# Patient Record
Sex: Male | Born: 1955 | Race: White | Hispanic: No | Marital: Single | Smoking: Current every day smoker
Health system: Southern US, Community
[De-identification: ages and names within clinical notes are randomized; demographics above are authoritative.]

## PROBLEM LIST (undated history)

## (undated) DIAGNOSIS — C320 Malignant neoplasm of glottis: Secondary | ICD-10-CM

## (undated) DIAGNOSIS — Z923 Personal history of irradiation: Secondary | ICD-10-CM

## (undated) DIAGNOSIS — K5900 Constipation, unspecified: Secondary | ICD-10-CM

## (undated) DIAGNOSIS — R070 Pain in throat: Principal | ICD-10-CM

## (undated) DIAGNOSIS — I10 Essential (primary) hypertension: Secondary | ICD-10-CM

## (undated) DIAGNOSIS — E785 Hyperlipidemia, unspecified: Secondary | ICD-10-CM

## (undated) DIAGNOSIS — F209 Schizophrenia, unspecified: Secondary | ICD-10-CM

## (undated) DIAGNOSIS — Z720 Tobacco use: Secondary | ICD-10-CM

## (undated) DIAGNOSIS — E669 Obesity, unspecified: Secondary | ICD-10-CM

## (undated) DIAGNOSIS — J449 Chronic obstructive pulmonary disease, unspecified: Secondary | ICD-10-CM

## (undated) DIAGNOSIS — R1310 Dysphagia, unspecified: Secondary | ICD-10-CM

## (undated) DIAGNOSIS — B370 Candidal stomatitis: Principal | ICD-10-CM

## (undated) HISTORY — PX: INGUINAL HERNIA REPAIR: SHX194

## (undated) HISTORY — DX: Hyperlipidemia, unspecified: E78.5

## (undated) HISTORY — DX: Chronic obstructive pulmonary disease, unspecified: J44.9

## (undated) HISTORY — DX: Candidal stomatitis: B37.0

## (undated) HISTORY — DX: Constipation, unspecified: K59.00

## (undated) HISTORY — DX: Pain in throat: R07.0

## (undated) HISTORY — DX: Essential (primary) hypertension: I10

---

## 2000-03-14 ENCOUNTER — Emergency Department (HOSPITAL_COMMUNITY): Admission: EM | Admit: 2000-03-14 | Discharge: 2000-03-14 | Payer: Self-pay | Admitting: Emergency Medicine

## 2013-08-06 ENCOUNTER — Other Ambulatory Visit (HOSPITAL_COMMUNITY): Payer: Self-pay | Admitting: Otolaryngology

## 2013-08-07 ENCOUNTER — Encounter (HOSPITAL_COMMUNITY): Payer: Self-pay

## 2013-08-12 ENCOUNTER — Encounter (HOSPITAL_COMMUNITY): Payer: Self-pay | Admitting: *Deleted

## 2013-08-12 NOTE — Progress Notes (Signed)
Spoke with Garry Heater, nurse for Mr. Alford Highland at [facility]. She verified no allergies, medical history - not aware of pt's surgical hx. Pt will be able to complete the history day of surgery. Someone from their facility will be accompanying pt. Pt will be signing his own consent.

## 2013-08-12 NOTE — H&P (Addendum)
08/13/13 10:37 AM  Andrew Osborn  PREOPERATIVE HISTORY AND PHYSICAL  CHIEF COMPLAINT: false vocal fold and epiglottis mass in a smoker, left tonsil mass  HISTORY: This is a 58 year old who was seen ~ 1 year ago with laryngoscopy demonstrating worrisome laryngeal lesions and a left tonsillar fossa mass. Dr. Simeon Craft recommended and scheduled tonsillectomy and laryngosocpy with biopsy, but the patient canceled this surgery against Dr. Theressa Millard advice. He came back to clinic recently, ~ 1 year after his original visit now with dysphagia and chronic weight loss and sore throat. Repeat laryngoscopy now demonstrated a fungating and ulcerating right false vocal fold and epiglottic mass worrisome for squamous cell carcinoma, and a stable left tonsil mass. He now presents for tonsillectomy and direct laryngoscopy with biopsy with possible tracheotomy.  Dr. Simeon Craft, Alroy Dust has discussed the risks (airway problems, need for emergency tracheotomy, bleeding, anoxic brain injury, death, etc.), benefits, and alternatives of this procedure. The patient understands the risks and would like to proceed with the procedure. The chances of success of the procedure are >50% and the patient understands this. I personally performed an examination of the patient within 24 hours of the procedure.  PAST MEDICAL HISTORY: Past Medical History  Diagnosis Date  . Obesity   . Schizophrenia   . Tobacco abuse   . Swallowing difficulty      PAST SURGICAL HISTORY: History reviewed. No pertinent past surgical history.  MEDICATIONS: No current facility-administered medications on file prior to encounter.   No current outpatient prescriptions on file prior to encounter.    ALLERGIES: No Known Allergies  SOCIAL HISTORY: History   Social History  . Marital Status: Single    Spouse Name: N/A    Number of Children: N/A  . Years of Education: N/A   Occupational History  . Not on file.   Social History Main Topics  .  Smoking status: Current Every Day Smoker    Types: Cigarettes  . Smokeless tobacco: Not on file  . Alcohol Use: Not on file  . Drug Use: Not on file  . Sexual Activity: Not on file   Other Topics Concern  . Not on file   Social History Narrative  . No narrative on file    FAMILY HISTORY:History reviewed. No pertinent family history.  REVIEW OF SYSTEMS:  HEENT: + for dysphagia, sore throat, weight loss, otherwise negative x 12 systems except per HPI.  PHYSICAL EXAM:  GENERAL:  Hoarse voice, no stridor VITAL SIGNS:  There were no vitals filed for this visit.  SKIN:  Warm, dry HEENT:  Freidman 1+ right tonsil, left ~1 to 2cm smooth left tonsillar mass. NECK:  No palpable lymphadenopathy ABDOMEN:  soft MUSCULOSKELETAL: normal strength PSYCH:  H/o schizophrenia but grossly normal affect NEUROLOGIC:  CN 2-12 intact and symmetric  DIAGNOSTIC STUDIES: office laryngosocpy showed an irregular right false vocal fold and R>L epiglottic mucosal mass with ulceration on the laryngeal surface of the epiglottis, mobile true vocal folds.  ASSESSMENT AND PLAN: Plan to proceed with direct laryngoscopy with biopsy, possible tracheotomy if needed, and tonsillectomy. Patient understands the risks, benefits, and alternatives.  08/13/13  10:38 AM Andrew Osborn

## 2013-08-13 ENCOUNTER — Ambulatory Visit (HOSPITAL_COMMUNITY)
Admission: RE | Admit: 2013-08-13 | Discharge: 2013-08-13 | Disposition: A | Discharge status: Home / Self Care | Payer: Medicare Other | Source: Ambulatory Visit | Attending: Otolaryngology | Admitting: Otolaryngology

## 2013-08-13 ENCOUNTER — Encounter (HOSPITAL_COMMUNITY): Payer: Medicare Other | Admitting: Certified Registered Nurse Anesthetist

## 2013-08-13 ENCOUNTER — Encounter (HOSPITAL_COMMUNITY): Payer: Self-pay | Admitting: Anesthesiology

## 2013-08-13 ENCOUNTER — Encounter (HOSPITAL_COMMUNITY)
Admission: RE | Disposition: A | Discharge status: Home / Self Care | Payer: Self-pay | Source: Ambulatory Visit | Attending: Otolaryngology

## 2013-08-13 ENCOUNTER — Ambulatory Visit (HOSPITAL_COMMUNITY): Payer: Medicare Other | Admitting: Certified Registered Nurse Anesthetist

## 2013-08-13 ENCOUNTER — Ambulatory Visit (HOSPITAL_COMMUNITY): Payer: Medicare Other

## 2013-08-13 DIAGNOSIS — C320 Malignant neoplasm of glottis: Secondary | ICD-10-CM

## 2013-08-13 DIAGNOSIS — F17200 Nicotine dependence, unspecified, uncomplicated: Secondary | ICD-10-CM | POA: Insufficient documentation

## 2013-08-13 DIAGNOSIS — F209 Schizophrenia, unspecified: Secondary | ICD-10-CM | POA: Diagnosis not present

## 2013-08-13 DIAGNOSIS — C321 Malignant neoplasm of supraglottis: Secondary | ICD-10-CM | POA: Diagnosis not present

## 2013-08-13 DIAGNOSIS — J383 Other diseases of vocal cords: Secondary | ICD-10-CM | POA: Diagnosis present

## 2013-08-13 DIAGNOSIS — J359 Chronic disease of tonsils and adenoids, unspecified: Secondary | ICD-10-CM | POA: Insufficient documentation

## 2013-08-13 HISTORY — DX: Obesity, unspecified: E66.9

## 2013-08-13 HISTORY — DX: Tobacco use: Z72.0

## 2013-08-13 HISTORY — DX: Malignant neoplasm of glottis: C32.0

## 2013-08-13 HISTORY — DX: Schizophrenia, unspecified: F20.9

## 2013-08-13 HISTORY — PX: DIRECT LARYNGOSCOPY: SHX5326

## 2013-08-13 HISTORY — DX: Dysphagia, unspecified: R13.10

## 2013-08-13 LAB — BASIC METABOLIC PANEL
Anion gap: 13 (ref 5–15)
BUN: 7 mg/dL (ref 6–23)
CALCIUM: 9.5 mg/dL (ref 8.4–10.5)
CO2: 27 meq/L (ref 19–32)
CREATININE: 0.63 mg/dL (ref 0.50–1.35)
Chloride: 94 mEq/L — ABNORMAL LOW (ref 96–112)
GFR calc Af Amer: >90 mL/min (ref >90)
GLUCOSE: 106 mg/dL — AB (ref 70–99)
Potassium: 4.2 mEq/L (ref 3.7–5.3)
SODIUM: 134 meq/L — AB (ref 137–147)

## 2013-08-13 LAB — CBC
HCT: 37.5 % — ABNORMAL LOW (ref 39.0–52.0)
Hemoglobin: 12.9 g/dL — ABNORMAL LOW (ref 13.0–17.0)
MCH: 30.4 pg (ref 26.0–34.0)
MCHC: 34.4 g/dL (ref 30.0–36.0)
MCV: 88.4 fL (ref 78.0–100.0)
PLATELETS: 224 10*3/uL (ref 150–400)
RBC: 4.24 MIL/uL (ref 4.22–5.81)
RDW: 13.6 % (ref 11.5–15.5)
WBC: 10 10*3/uL (ref 4.0–10.5)

## 2013-08-13 SURGERY — LARYNGOSCOPY, DIRECT
Anesthesia: General | Site: Throat

## 2013-08-13 MED ORDER — ONDANSETRON HCL 4 MG/2ML IJ SOLN
INTRAMUSCULAR | Status: DC | PRN
  Administered 2013-08-13: 4 mg via INTRAVENOUS

## 2013-08-13 MED ORDER — LIDOCAINE HCL (CARDIAC) 20 MG/ML IV SOLN
INTRAVENOUS | Status: DC | PRN
  Administered 2013-08-13: 100 mg via INTRAVENOUS
  Administered 2013-08-13: 60 mg via INTRATRACHEAL

## 2013-08-13 MED ORDER — EPINEPHRINE HCL (NASAL) 0.1 % NA SOLN
NASAL | Status: AC
  Filled 2013-08-13: qty 30

## 2013-08-13 MED ORDER — FENTANYL CITRATE 0.05 MG/ML IJ SOLN
INTRAMUSCULAR | Status: AC
  Filled 2013-08-13: qty 5

## 2013-08-13 MED ORDER — LIDOCAINE HCL (CARDIAC) 20 MG/ML IV SOLN
INTRAVENOUS | Status: AC
Start: 2013-08-13 — End: 2013-08-13
  Filled 2013-08-13: qty 10

## 2013-08-13 MED ORDER — DEXAMETHASONE SODIUM PHOSPHATE 10 MG/ML IJ SOLN
10.0000 mg | Freq: Once | INTRAMUSCULAR | Status: AC
  Administered 2013-08-13: 10 mg via INTRAVENOUS

## 2013-08-13 MED ORDER — ONDANSETRON HCL 4 MG/2ML IJ SOLN: 4.0000 mg | Freq: Once | INTRAMUSCULAR | Status: DC | PRN

## 2013-08-13 MED ORDER — CLINDAMYCIN PHOSPHATE 600 MG/50ML IV SOLN
600.0000 mg | Freq: Once | INTRAVENOUS | Status: AC
  Administered 2013-08-13: 600 mg via INTRAVENOUS
  Filled 2013-08-13: qty 50

## 2013-08-13 MED ORDER — LIDOCAINE HCL (CARDIAC) 20 MG/ML IV SOLN
INTRAVENOUS | Status: AC
  Filled 2013-08-13: qty 5

## 2013-08-13 MED ORDER — FENTANYL CITRATE 0.05 MG/ML IJ SOLN
INTRAMUSCULAR | Status: DC | PRN
  Administered 2013-08-13: 100 ug via INTRAVENOUS

## 2013-08-13 MED ORDER — OXYMETAZOLINE HCL 0.05 % NA SOLN
NASAL | Status: AC
  Filled 2013-08-13: qty 15

## 2013-08-13 MED ORDER — LACTATED RINGERS IV SOLN
INTRAVENOUS | Status: DC
  Administered 2013-08-13 (×2): via INTRAVENOUS

## 2013-08-13 MED ORDER — MIDAZOLAM HCL 2 MG/2ML IJ SOLN
INTRAMUSCULAR | Status: AC
  Filled 2013-08-13: qty 2

## 2013-08-13 MED ORDER — HYDROMORPHONE HCL PF 1 MG/ML IJ SOLN: 0.2500 mg | INTRAMUSCULAR | Status: DC | PRN

## 2013-08-13 MED ORDER — ARTIFICIAL TEARS OP OINT
TOPICAL_OINTMENT | OPHTHALMIC | Status: DC | PRN
  Administered 2013-08-13: 1 via OPHTHALMIC

## 2013-08-13 MED ORDER — ROCURONIUM BROMIDE 50 MG/5ML IV SOLN
INTRAVENOUS | Status: AC
  Filled 2013-08-13: qty 1

## 2013-08-13 MED ORDER — SUCCINYLCHOLINE CHLORIDE 20 MG/ML IJ SOLN
INTRAMUSCULAR | Status: DC | PRN
  Administered 2013-08-13: 100 mg via INTRAVENOUS

## 2013-08-13 MED ORDER — PROPOFOL 10 MG/ML IV BOLUS
INTRAVENOUS | Status: AC
  Filled 2013-08-13: qty 20

## 2013-08-13 MED ORDER — PROPOFOL 10 MG/ML IV BOLUS
INTRAVENOUS | Status: DC | PRN
  Administered 2013-08-13: 260 mg via INTRAVENOUS

## 2013-08-13 MED ORDER — ROCURONIUM BROMIDE 100 MG/10ML IV SOLN
INTRAVENOUS | Status: DC | PRN
  Administered 2013-08-13: 30 mg via INTRAVENOUS

## 2013-08-13 MED ORDER — EPINEPHRINE HCL 1 MG/ML IJ SOLN
INTRAMUSCULAR | Status: AC
  Filled 2013-08-13: qty 1

## 2013-08-13 MED ORDER — NEOSTIGMINE METHYLSULFATE 10 MG/10ML IV SOLN
INTRAVENOUS | Status: DC | PRN
  Administered 2013-08-13: 3 mg via INTRAVENOUS

## 2013-08-13 MED ORDER — GLYCOPYRROLATE 0.2 MG/ML IJ SOLN
INTRAMUSCULAR | Status: AC
  Filled 2013-08-13: qty 2

## 2013-08-13 MED ORDER — GLYCOPYRROLATE 0.2 MG/ML IJ SOLN
INTRAMUSCULAR | Status: DC | PRN
  Administered 2013-08-13: 0.4 mg via INTRAVENOUS

## 2013-08-13 MED ORDER — ARTIFICIAL TEARS OP OINT
TOPICAL_OINTMENT | OPHTHALMIC | Status: AC
  Filled 2013-08-13: qty 3.5

## 2013-08-13 MED ORDER — OXYMETAZOLINE HCL 0.05 % NA SOLN
NASAL | Status: DC | PRN
  Administered 2013-08-13: 1

## 2013-08-13 MED ORDER — EPHEDRINE SULFATE 50 MG/ML IJ SOLN
INTRAMUSCULAR | Status: DC | PRN
  Administered 2013-08-13: 10 mg via INTRAVENOUS

## 2013-08-13 MED ORDER — EPINEPHRINE HCL 1 MG/ML IJ SOLN
INTRAMUSCULAR | Status: DC | PRN
  Administered 2013-08-13: 1 mg

## 2013-08-13 MED ORDER — 0.9 % SODIUM CHLORIDE (POUR BTL) OPTIME
TOPICAL | Status: DC | PRN
  Administered 2013-08-13: 1000 mL

## 2013-08-13 MED ORDER — ONDANSETRON HCL 4 MG/2ML IJ SOLN
INTRAMUSCULAR | Status: AC
  Filled 2013-08-13: qty 2

## 2013-08-13 MED ORDER — ESMOLOL HCL 10 MG/ML IV SOLN
INTRAVENOUS | Status: DC | PRN
  Administered 2013-08-13: 20 mg via INTRAVENOUS

## 2013-08-13 MED ORDER — PHENYLEPHRINE HCL 10 MG/ML IJ SOLN
INTRAMUSCULAR | Status: DC | PRN
  Administered 2013-08-13: 80 ug via INTRAVENOUS
  Administered 2013-08-13: 40 ug via INTRAVENOUS
  Administered 2013-08-13: 80 ug via INTRAVENOUS
  Administered 2013-08-13: 40 ug via INTRAVENOUS

## 2013-08-13 SURGICAL SUPPLY — 34 items
CANISTER SUCTION 2500CC (MISCELLANEOUS) ×4 IMPLANT
CATH ROBINSON RED A/P 10FR (CATHETERS) ×3 IMPLANT
CLEANER TIP ELECTROSURG 2X2 (MISCELLANEOUS) ×4 IMPLANT
CONT SPEC 4OZ CLIKSEAL STRL BL (MISCELLANEOUS) ×9 IMPLANT
COVER TABLE BACK 60X90 (DRAPES) ×4 IMPLANT
DRAPE PROXIMA HALF (DRAPES) ×4 IMPLANT
ELECT COATED BLADE 2.86 ST (ELECTRODE) ×4 IMPLANT
GAUZE SPONGE 4X4 16PLY XRAY LF (GAUZE/BANDAGES/DRESSINGS) ×4 IMPLANT
GLOVE BIO SURGEON STRL SZ 6.5 (GLOVE) ×2 IMPLANT
GLOVE BIO SURGEONS STRL SZ 6.5 (GLOVE) ×1
GLOVE BIOGEL PI IND STRL 6.5 (GLOVE) ×1 IMPLANT
GLOVE BIOGEL PI IND STRL 7.0 (GLOVE) ×1 IMPLANT
GLOVE BIOGEL PI INDICATOR 6.5 (GLOVE) ×2
GLOVE BIOGEL PI INDICATOR 7.0 (GLOVE) ×2
GLOVE SURG SS PI 7.0 STRL IVOR (GLOVE) ×3 IMPLANT
GLOVE SURG SS PI 7.5 STRL IVOR (GLOVE) ×4 IMPLANT
GOWN STRL REUS W/ TWL LRG LVL3 (GOWN DISPOSABLE) ×4 IMPLANT
GOWN STRL REUS W/TWL LRG LVL3 (GOWN DISPOSABLE) ×12
GUARD TEETH (MISCELLANEOUS) ×4 IMPLANT
KIT BASIN OR (CUSTOM PROCEDURE TRAY) ×4 IMPLANT
KIT ROOM TURNOVER OR (KITS) ×4 IMPLANT
MARKER SKIN DUAL TIP RULER LAB (MISCELLANEOUS) ×4 IMPLANT
NS IRRIG 1000ML POUR BTL (IV SOLUTION) ×4 IMPLANT
PACK SURGICAL SETUP 50X90 (CUSTOM PROCEDURE TRAY) ×4 IMPLANT
PAD ARMBOARD 7.5X6 YLW CONV (MISCELLANEOUS) ×8 IMPLANT
PATTIES SURGICAL .5 X1 (DISPOSABLE) ×4 IMPLANT
PENCIL BUTTON HOLSTER BLD 10FT (ELECTRODE) ×4 IMPLANT
SOLUTION ANTI FOG 6CC (MISCELLANEOUS) ×4 IMPLANT
SPONGE TONSIL 1 RF SGL (DISPOSABLE) ×3 IMPLANT
SYR BULB 3OZ (MISCELLANEOUS) ×4 IMPLANT
TOWEL OR 17X24 6PK STRL BLUE (TOWEL DISPOSABLE) ×8 IMPLANT
TUBE CONNECTING 12'X1/4 (SUCTIONS) ×2
TUBE CONNECTING 12X1/4 (SUCTIONS) ×5 IMPLANT
YANKAUER SUCT BULB TIP NO VENT (SUCTIONS) ×4 IMPLANT

## 2013-08-13 NOTE — Discharge Instructions (Signed)
Follow up with Dr. Simeon Craft in one week. Rx given to caretaker for amoxicillin, zofran, hydrocodone, and medrol dose pack. Soft or regular diet as tolerated.

## 2013-08-13 NOTE — Anesthesia Procedure Notes (Signed)
Procedure Name: Intubation Date/Time: 08/13/2013 11:13 AM Performed by: Maryland Pink Pre-anesthesia Checklist: Patient identified, Emergency Drugs available, Suction available, Patient being monitored and Timeout performed Patient Re-evaluated:Patient Re-evaluated prior to inductionOxygen Delivery Method: Circle system utilized Preoxygenation: Pre-oxygenation with 100% oxygen Intubation Type: IV induction Ventilation: Mask ventilation without difficulty and Oral airway inserted - appropriate to patient size Laryngoscope Size: Mac and 3 Tube type: Oral Tube size: 6.5 mm Number of attempts: 1 Airway Equipment and Method: Stylet and LTA kit utilized Placement Confirmation: ETT inserted through vocal cords under direct vision,  positive ETCO2 and breath sounds checked- equal and bilateral Secured at: 23 cm Tube secured with: Tape Dental Injury: Teeth and Oropharynx as per pre-operative assessment

## 2013-08-13 NOTE — Anesthesia Postprocedure Evaluation (Signed)
  Anesthesia Post-op Note  Patient: Andrew Osborn  Procedure(s) Performed: Procedure(s) with comments: DIRECT LARYNGOSCOPY (N/A) - Direct Laryngoscopy with biopsy.  Patient Location: PACU  Anesthesia Type:General  Level of Consciousness: awake, alert , oriented and patient cooperative  Airway and Oxygen Therapy: Patient Spontanous Breathing  Post-op Pain: mild  Post-op Assessment: Post-op Vital signs reviewed, Patient's Cardiovascular Status Stable, Respiratory Function Stable, Patent Airway, No signs of Nausea or vomiting and Pain level controlled  Post-op Vital Signs: stable  Last Vitals:  Filed Vitals:   08/13/13 1319  BP: 102/70  Pulse: 72  Temp:   Resp:     Complications: No apparent anesthesia complications

## 2013-08-13 NOTE — Anesthesia Preprocedure Evaluation (Signed)
Anesthesia Evaluation  Patient identified by MRN, date of birth, ID band Patient awake    Reviewed: Allergy & Precautions, H&P , NPO status , Patient's Chart, lab work & pertinent test results  Airway       Dental   Pulmonary Current Smoker,          Cardiovascular     Neuro/Psych Schizophrenia    GI/Hepatic   Endo/Other  Morbid obesity  Renal/GU      Musculoskeletal   Abdominal   Peds  Hematology   Anesthesia Other Findings Throat mass  Reproductive/Obstetrics                           Anesthesia Physical Anesthesia Plan  ASA: II  Anesthesia Plan: General   Post-op Pain Management:    Induction: Intravenous  Airway Management Planned: Oral ETT  Additional Equipment:   Intra-op Plan:   Post-operative Plan: Extubation in OR  Informed Consent: I have reviewed the patients History and Physical, chart, labs and discussed the procedure including the risks, benefits and alternatives for the proposed anesthesia with the patient or authorized representative who has indicated his/her understanding and acceptance.     Plan Discussed with: CRNA, Anesthesiologist and Surgeon  Anesthesia Plan Comments:         Anesthesia Quick Evaluation

## 2013-08-13 NOTE — Op Note (Signed)
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  ESTIMATED BLOOD LOSS: less than 15 mL.  DRAINS: none SPECIMENS: right false vocal fold mass for permanent and frozen (invasive squamous cell carcinoma on frozen) INDICATIONS: The patient is a 58yo with a history of progressive right false vocal fold/epiglottis lesions/masses. He was encouraged to have direct laryngoscopy with biopsy one year ago for this but refused/canceled. He is now taken to the OR for direct laryngoscopy with biopsy.  DESCRIPTION OF OPERATION: The patient was brought to the operating room and was placed in the supine position and was placed under general endotracheal anesthesia by anesthesiology.   Direct laryngoscopy was performed using the Dedo laryngoscope and the suspension set and 0 degree endoscope. He was a grade 1 view. The right and left tonsil were actually symmetric and Freidman 1 to 2+ and symmetric on laryngoscopy so tonsillectomy/tonsil biopsy was not performed. The tongue base, piriform sinuses,  and posterior pharynx appeared grossly normal. The right false vocal fold and the right and midline laryngeal surface of the epiglottis showed a fungating, irregular mass. This was biopsied using the straight biopsy forceps and the 0 degree endoscope. Frozen section biopsy came back positive for invasive squamous cell carcinoma. The true vocal folds were grossly free of tumor and the subglottic larynx was free of tumor but the right laryngeal ventricle was involved and the false vocal fold tumor was very close to the superior surface of the right true vocal fold. Once the biopsies came back positive for squamous cell carcinoma I  obtained hemostasis with topical 1:1000 epinephrine pledgets which were removed after 10 minutes. Hemostasis was noted so the endoscope, Dedo, suspension, and tooth guard were all removed.   The patient was turned back to anesthesia and awakened from anesthesia and extubated without difficulty. The patient tolerated the procedure well with no immediate complications and was taken to the postoperative recovery area in good condition.   Dr. Ruby Cola was present and performed the entire procedure. 08/13/2013  12:17 PM Ruby Cola

## 2013-08-13 NOTE — Discharge Summary (Signed)
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 Simeon Craft in one week. Rx given to caretaker for amoxicillin, zofran, hydrocodone, and medrol dose pack. Soft or regular diet as tolerated.  Hospital Course: taken to OR for DL with biopsy, did well post-op, discharged from PACU in good condition.  Ruby Cola 12:28 PM 08/13/2013

## 2013-08-13 NOTE — Transfer of Care (Signed)
Immediate Anesthesia Transfer of Care Note  Patient: Andrew Osborn  Procedure(s) Performed: Procedure(s) with comments: DIRECT LARYNGOSCOPY (N/A) - Direct Laryngoscopy with biopsy.  Patient Location: PACU  Anesthesia Type:General  Level of Consciousness: awake, alert  and oriented  Airway & Oxygen Therapy: Patient Spontanous Breathing and Patient connected to nasal cannula oxygen  Post-op Assessment: Report given to PACU RN and Post -op Vital signs reviewed and stable  Post vital signs: Reviewed and stable  Complications: No apparent anesthesia complications

## 2013-08-14 ENCOUNTER — Encounter (HOSPITAL_COMMUNITY): Payer: Self-pay | Admitting: Otolaryngology

## 2013-08-17 ENCOUNTER — Telehealth: Payer: Self-pay | Admitting: Hematology and Oncology

## 2013-08-17 NOTE — Telephone Encounter (Signed)
S/W MS. Joya Gaskins AND GAVE NP APPT FOR 07/28 @ 10:45 W/DR. [place]

## 2013-08-19 ENCOUNTER — Encounter: Payer: Self-pay | Admitting: Radiation Oncology

## 2013-08-19 NOTE — Progress Notes (Addendum)
Head and Neck Cancer Location of Tumor / Histology: Invasive Squamous Cell Carcinoma Right of the Vocal Cord   "This is a 58 year old who was seen ~ 1 year ago with laryngoscopy demonstrating worrisome laryngeal lesions and a left tonsillar fossa mass. Dr. Simeon Craft recommended and scheduled tonsillectomy and laryngosocpy with biopsy, but the patient canceled this surgery against Dr. Theressa Millard advice. He came back to clinic recently, ~ 1 year after his original visit now with dysphagia and chronic weight loss and sore throat. Repeat laryngoscopy now demonstrated a fungating and ulcerating right false vocal fold and epiglottic mass worrisome for squamous cell carcinoma, and a stable left tonsil mass. He now presents for tonsillectomy and direct laryngoscopy with biopsy with possible tracheotomy."  Biopsies of Right Vocal Cord (if applicable) revealed:  05/31/75 Diagnosis 1. Larynx, biopsy, Right false vocal cord mass - INVASIVE SQUAMOUS CELL CARCINOMA, SEE COMMENT. 2. Larynx, biopsy, Right vocal cord - INVASIVE SQUAMOUS CELL CARCINOMA, SEE COMMENT The invasive tumor demonstrates diffuse moderate to strong p16 immunostain expression (parts 1and 2).  Nutrition Status:  Weight changes:   Loss of ? Lbs in past 3 weeks, weight 215 lbs 5 years ago  Swallowing status: Dysphagia and Normal, if any, for PEG tube:   Tobacco/Marijuana/Snuff/ETOH use: smokes cigarettes, currently 1/2 PPD x 20 years, no smokeless tobacco, alcohol in past  Past/Anticipated interventions by otolaryngology, if any: Dr. Ruby Cola- Biopsy of the Larynx - Right Vocal Cords.  Past/Anticipated interventions by medical oncology, if any: Appointment with Dr. Heath Lark on 08/25/13  Referrals yet, to any of the following?  Social Work? no  Dentistry? no  Swallowing therapy? no  Nutrition? no  Med/Onc? 08/25/13  PEG placement? no  Financial Counseling: 08/25/13  SAFETY ISSUES:  Prior radiation?  NO  Pacemaker/ICD? no  Possible current pregnancy? NA  Is the patient on methotrexate? No  Current Complaints / other details:  Has lived at [facility] x [facility] years. 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 Never married, no children. He states he weighed 215 lbs 5 years ago. Loss of appetite, difficulty eating, swallowing x 3 weeks. Pt reports right ear pain w/swallowing that radiates to his jaw.

## 2013-08-21 ENCOUNTER — Encounter: Payer: Self-pay | Admitting: Radiation Oncology

## 2013-08-21 ENCOUNTER — Other Ambulatory Visit: Payer: Self-pay | Admitting: Radiation Oncology

## 2013-08-21 ENCOUNTER — Encounter: Payer: Self-pay | Admitting: *Deleted

## 2013-08-21 ENCOUNTER — Telehealth: Payer: Self-pay | Admitting: *Deleted

## 2013-08-21 ENCOUNTER — Ambulatory Visit
Admission: RE | Admit: 2013-08-21 | Discharge: 2013-08-21 | Disposition: A | Discharge status: Home / Self Care | Payer: Medicare Other | Source: Ambulatory Visit | Attending: Radiation Oncology | Admitting: Radiation Oncology

## 2013-08-21 VITALS — BP 132/82 | HR 71 | Temp 97.8°F | Resp 20 | Ht 70.5 in | Wt 155.2 lb

## 2013-08-21 DIAGNOSIS — Z931 Gastrostomy status: Secondary | ICD-10-CM | POA: Diagnosis not present

## 2013-08-21 DIAGNOSIS — L589 Radiodermatitis, unspecified: Secondary | ICD-10-CM | POA: Insufficient documentation

## 2013-08-21 DIAGNOSIS — C321 Malignant neoplasm of supraglottis: Secondary | ICD-10-CM | POA: Insufficient documentation

## 2013-08-21 DIAGNOSIS — F17200 Nicotine dependence, unspecified, uncomplicated: Secondary | ICD-10-CM | POA: Insufficient documentation

## 2013-08-21 DIAGNOSIS — R1310 Dysphagia, unspecified: Secondary | ICD-10-CM | POA: Insufficient documentation

## 2013-08-21 DIAGNOSIS — F209 Schizophrenia, unspecified: Secondary | ICD-10-CM | POA: Insufficient documentation

## 2013-08-21 DIAGNOSIS — Z510 Encounter for antineoplastic radiation therapy: Secondary | ICD-10-CM | POA: Diagnosis not present

## 2013-08-21 DIAGNOSIS — Z7982 Long term (current) use of aspirin: Secondary | ICD-10-CM | POA: Insufficient documentation

## 2013-08-21 HISTORY — DX: Malignant neoplasm of glottis: C32.0

## 2013-08-21 MED ORDER — LARYNGOSCOPY SOLUTION RAD-ONC
15.0000 mL | Freq: Once | TOPICAL | Status: AC
  Administered 2013-08-21: 15 mL via TOPICAL
  Filled 2013-08-21: qty 15

## 2013-08-21 NOTE — Progress Notes (Signed)
Please see the Nurse Progress Note in the MD Initial Consult Encounter for this patient. 

## 2013-08-21 NOTE — Telephone Encounter (Signed)
[place], spoke with [place].  Informed her of patient's 08/26/13 12:45 arrival appt for PET and CT scans; noted pt is not to have anything to eat/drink 6 hrs prior to appt except for small sips of water with necessary medications.  I asked that she relay information to [place], [place] staffperson who will be bringing pt to his appts.  She verbalized understanding.    Gayleen Orem, RN, BSN, [facility] Navigator [phone]

## 2013-08-21 NOTE — Progress Notes (Signed)
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 male with schizophrenia, accompanied but a caregiver from his group home today, who was seen ~ 1 year ago with laryngoscopy demonstrating worrisome laryngeal lesions and a left tonsillar fossa mass. Dr. Simeon Craft recommended and scheduled tonsillectomy and laryngosocpy with biopsy, but the patient canceled this surgery against Dr. Theressa Millard advice. He came back to clinic recently, ~ 1 year after his original visit now with dysphagia, odynophagia,  and chronic weight loss. Repeat laryngoscopy now demonstrated a fungating and ulcerating right false vocal fold and epiglottic mass worrisome for squamous cell carcinoma, and a stable left tonsil mass. Per op note, left tonsil mass did not appear suspicious enough for biopsy, but right false and true cords were biopsied.  60lb wt loss/ 5 yrs  He is mainly drinking fluids, little food at all due to swallowing problems.He has right otalgia. No stridor or respiratory issues.  Tobacco/Marijuana/Snuff/ETOH use: smokes cigarettes, currently 1/2 PPD x 20 years, no smokeless tobacco, alcohol in past    Biopsies of Right Vocal Cord (if applicable) revealed:  2/58/52  Diagnosis  1. Larynx, biopsy, Right false vocal cord mass  - INVASIVE SQUAMOUS CELL CARCINOMA, SEE COMMENT.  2. Larynx, biopsy, Right vocal cord  - INVASIVE SQUAMOUS CELL CARCINOMA, SEE COMMENT  The invasive tumor demonstrates diffuse moderate to strong p16 immunostain expression (parts 1and 2).  Past/Anticipated interventions by medical oncology, if any: Appointment  with Dr. 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    PAST SURGICAL HISTORY: Past Surgical History  Procedure Laterality Date  . Direct laryngoscopy N/A 08/13/2013    Procedure: DIRECT LARYNGOSCOPY;  Surgeon: Ruby Cola, MD;  Location: [facility];  Service: ENT;  Laterality: N/A;  Direct Laryngoscopy with biopsy.  . Inguinal hernia repair      as 58 year old    FAMILY HISTORY: family history includes Cancer in his father and mother.  SOCIAL HISTORY:  reports that he has been smoking Cigarettes.  He has a 10 pack-year smoking history. He does not have any smokeless tobacco history on file. He reports that he does not drink alcohol or use illicit drugs.  ALLERGIES: Review of patient's allergies indicates no known allergies.  MEDICATIONS:  Current Outpatient Prescriptions  Medication Sig Dispense Refill  . acetaminophen (TYLENOL) 325 MG tablet Take 650 mg by mouth every 6 (six) hours as needed (pain).      Marland Kitchen amoxicillin (AMOXIL) 400 MG/5ML suspension Take 400 mg by mouth 2 (two) times daily. Amoxicillin 400 mg/5ml.  Take 6 ml po BID      . aspirin EC 81 MG tablet Take 81 mg by mouth daily.      . budesonide-formoterol (SYMBICORT) 80-4.5 MCG/ACT inhaler Inhale 2 puffs into the lungs 2 (two) times daily.      . cholecalciferol (VITAMIN D) 400 UNITS TABS tablet Take 800 Units by mouth daily.      . cycloSPORINE (RESTASIS) 0.05 % ophthalmic emulsion Place 1 drop  into both eyes 2 (two) times daily.      . hydrocerin (EUCERIN) CREA Apply 1 application topically 2 (two) times daily. Spread to bilateral palm twice daily      . HYDROCODONE-ACETAMINOPHEN PO Take 7.5 mg/mL by mouth as needed (Hydorcodone 7.5/325mg /86ml oral solution.  Take 15 ml every 4-6 hrs prn pain).      Marland Kitchen ibuprofen (ADVIL,MOTRIN) 400 MG tablet Take  400 mg by mouth every 6 (six) hours as needed for headache or mild pain.      Marland Kitchen lisinopril (PRINIVIL,ZESTRIL) 10 MG tablet Take 10 mg by mouth daily.      . ondansetron (ZOFRAN) 4 MG tablet Take 4 mg by mouth every 8 (eight) hours as needed for nausea or vomiting.      . risperiDONE (RISPERDAL) 2 MG tablet Take 2 mg by mouth 2 (two) times daily.      . simvastatin (ZOCOR) 10 MG tablet Take 10 mg by mouth at bedtime.       No current facility-administered medications for this encounter.    REVIEW OF SYSTEMS:  Notable for that above.   PHYSICAL EXAM:  height is 5' 10.5" (1.791 m) and weight is 155 lb 3.2 oz (70.398 kg). His oral temperature is 97.8 F (36.6 C). His blood pressure is 132/82 and his pulse is 71. His respiration is 20.   General: Alert and oriented, in no acute distress HEENT: Head is normocephalic. Pupils are equally round and reactive to light. Extraocular movements are intact. Oropharynx is notable for asymmetric swelling of left tonsil. Poor dentition. Moist mucosa. Neck: soft swelling in right level II - query enlarged node. Otherwise no cervical or supraclavicular lymphadenopathy. Heart: Regular in rate and rhythm with no murmurs, rubs, or gallops. Chest: Clear to auscultation bilaterally, with no rhonchi, wheezes, or rales. Abdomen: Soft, nontender, nondistended, with no rigidity or guarding. Extremities: No cyanosis or edema. Lymphatics: No concerning lymphadenopathy. Skin: No concerning lesions. Musculoskeletal: symmetric strength and muscle tone throughout. Neurologic: Cranial nerves II through XII are grossly intact. No obvious focalities. Speech is fluent. Coordination is intact. Psychiatric: Judgment and insight are intact. Affect is appropriate.  PROCEDURE NOTE: After anesthetizing the nasal cavity with topical lidocaine and phenylephrine, the flexible endoscope was introduced and passed through the nasal cavity.  Notable for right false cord swelling and  irregularity along the laryngeal surface of the right epiglottis.  ECOG = 2  0 - Asymptomatic (Fully active, able to carry on all predisease activities without restriction)  1 - Symptomatic but completely ambulatory (Restricted in physically strenuous activity but ambulatory and able to carry out work of a light or sedentary nature. For example, light housework, office work)  2 - Symptomatic, <50% in bed during the day (Ambulatory and capable of all self care but unable to carry out any work activities. Up and about more than 50% of waking hours)  3 - Symptomatic, >50% in bed, but not bedbound (Capable of only limited self-care, confined to bed or chair 50% or more of waking hours)  4 - Bedbound (Completely disabled. Cannot carry on any self-care. Totally confined to bed or chair)  5 - Death   Eustace Pen MM, Creech RH, Tormey DC, et al. [phone]). "Toxicity and response criteria of the [facility]". [employer] Oncol. 5 (6): 649-55   LABORATORY DATA:  Lab Results  Component Value Date   WBC 10.0 08/13/2013   HGB 12.9* 08/13/2013   HCT 37.5* 08/13/2013   MCV 88.4  08/13/2013   PLT 224 08/13/2013   CMP     Component Value Date/Time   NA 134* 08/13/2013 0950   K 4.2 08/13/2013 0950   CL 94* 08/13/2013 0950   CO2 27 08/13/2013 0950   GLUCOSE 106* 08/13/2013 0950   BUN 7 08/13/2013 0950   CREATININE 0.63 08/13/2013 0950   CALCIUM 9.5 08/13/2013 0950   GFRNONAA >90 08/13/2013 0950   GFRAA >90 08/13/2013 0950         RADIOGRAPHY: Dg Chest 2 View  08/13/2013   CLINICAL DATA:  Swallowing difficulty; history of schizophrenia  EXAM: CHEST  2 VIEW  COMPARISON:  None.  FINDINGS: The lungs are mildly hyperinflated and clear. The heart and mediastinal structures are normal. There is no pleural effusion or pneumothorax. The bony thorax is unremarkable.  IMPRESSION: COPD. There is no active cardiopulmonary disease. No mediastinal mass is demonstrated.   Electronically Signed   By: David   [place]   On: 08/13/2013 10:27      IMPRESSION/PLAN: This is a delightful 58 year old man with right supraglottic squamous cell carcinoma, positive smoking history. He is an excellent candidate for radiotherapy. Plan is as below:   1) Next week will med/onc to discuss chemotherapy  1a) PET and neck CT ordered today for staging. He understands this is needed to finalize treatment recommendations  2) Referral to dentistry for dental evaluation/extractions if needed   3) Will refer to social work for social support   4) Will refer to nutrition for nutrition support - gave patient and caregiver suggestions on ways to improve nutrition until then  5) Medical Oncology may eventually refer to surgery for PEG tube placement. This is depending on chemotherapy plans, if any.   6) Will refer to swallowing therapy for dysphagia    7) Simulation once cleared by dentistry. Anticipate 7 weeks of RT - 70 Gy in 35 fractions.   8) PT referral will be put on hold - do not want to overwhelm him with appointments right now.  9) Gayleen Orem, RN, our Head and Neck Oncology Navigator will add to ENT tumor board for discussion  10) The patient continues to use tobacco. The patient was counseled to stop using tobacco and was offered pharmacotherapy and further counseling to help with this. The patient at this time will try E cigarettes and would rather consider total cessation later. Informational sheets on quitting hotline/ support groups provided today.  It was a pleasure meeting the patient today. We discussed the risks, benefits, and side effects of adjuvant radiotherapy. We talked in detail about acute and late effects. He understands that some of the most bothersome acute effects will be significant soreness of the mouth and throat, changes in taste, changes in salivary function, skin irritation, hair loss, dehydration, weight loss and fatigue. We talked about late effects which include but are not necessarily  limited to dysphagia, hypothyroidism, laryngeal injury, nerve injury, spinal cord injury, dry mouth, and neck edema. No guarantees of treatment were given. A consent form was signed and placed in the patient's medical record. The patient is enthusiastic about proceeding with treatment. I look forward to participating in the patient's care.   __________________________________________   Eppie Gibson, MD

## 2013-08-21 NOTE — Progress Notes (Signed)
Met with patient and his [place] caregiver, Ivin Booty, during initial consult with Dr. Isidore Moos.  Introduced myself as his Navigator, explained my role as a member of the Care Team, provided contact information, encouraged them to contact me with questions/concerns as treatments/procedures begin.  They indicated understanding.  Explained RT procedure, showed examples of masks.  Provided smoking cessation information.  Provided them a tour of [facility] and [facility] areas, explained treatments and arrival procedures.  Showed them location of Dr. Bryon Lions office and [facility] for pending appts, explained arrival procedure for these locations.  They verbalized understanding.  Initiating navigation as L1 patient (new) with this encounter.  Gayleen Orem, RN, BSN, [facility] Head & Neck Oncology Navigator [phone]

## 2013-08-25 ENCOUNTER — Ambulatory Visit (HOSPITAL_BASED_OUTPATIENT_CLINIC_OR_DEPARTMENT_OTHER): Payer: Medicare Other | Admitting: Hematology and Oncology

## 2013-08-25 ENCOUNTER — Encounter: Payer: Self-pay | Admitting: Hematology and Oncology

## 2013-08-25 ENCOUNTER — Ambulatory Visit: Payer: Medicare Other

## 2013-08-25 VITALS — BP 106/66 | HR 64 | Temp 97.8°F | Resp 20 | Ht 70.5 in | Wt 154.1 lb

## 2013-08-25 DIAGNOSIS — D638 Anemia in other chronic diseases classified elsewhere: Secondary | ICD-10-CM

## 2013-08-25 DIAGNOSIS — D630 Anemia in neoplastic disease: Secondary | ICD-10-CM

## 2013-08-25 DIAGNOSIS — Z720 Tobacco use: Secondary | ICD-10-CM | POA: Insufficient documentation

## 2013-08-25 DIAGNOSIS — J029 Acute pharyngitis, unspecified: Secondary | ICD-10-CM

## 2013-08-25 DIAGNOSIS — F17200 Nicotine dependence, unspecified, uncomplicated: Secondary | ICD-10-CM

## 2013-08-25 DIAGNOSIS — B977 Papillomavirus as the cause of diseases classified elsewhere: Secondary | ICD-10-CM

## 2013-08-25 DIAGNOSIS — C321 Malignant neoplasm of supraglottis: Secondary | ICD-10-CM

## 2013-08-25 DIAGNOSIS — K029 Dental caries, unspecified: Secondary | ICD-10-CM

## 2013-08-25 HISTORY — DX: Tobacco use: Z72.0

## 2013-08-25 MED ORDER — NICOTINE 7 MG/24HR TD PT24: 7.0000 mg | MEDICATED_PATCH | Freq: Every day | TRANSDERMAL | Status: DC

## 2013-08-25 NOTE — Assessment & Plan Note (Signed)
Personally, I could not appreciate enlarged lymphadenopathy on clinical examination. I am waiting for imaging study tomorrow and we'll review his case at the next ENT tumor board. If the patient has organ confined disease without lymph node metastasis, he can proceed without concurrent chemotherapy.

## 2013-08-25 NOTE — Assessment & Plan Note (Signed)
He has very poor dentition. A dentist has been consulted.

## 2013-08-25 NOTE — Assessment & Plan Note (Signed)
This is likely anemia of chronic disease. The patient denies recent history of bleeding such as epistaxis, hematuria or hematochezia. He is asymptomatic from the anemia. We will observe for now.  

## 2013-08-25 NOTE — Assessment & Plan Note (Signed)
I spent some time counseling the patient the importance of tobacco cessation. he is currently attempting to quit on his own. I gave him a prescription of nicotine patch to try.

## 2013-08-25 NOTE — Progress Notes (Signed)
Checked in new pt with no financial concerns. °

## 2013-08-25 NOTE — Progress Notes (Signed)
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 male is here because of newly diagnosed squamous carcinoma of the vocal cord. According to the patient, the first initial presentation was due to sensation of sore throat and mild hoarseness. The patient was lost to followup. Recently, he has progressive weight loss, slight worsening sore throat and changing quality of voice.  he denies any hearing deficit or difficulties with chewing food.   Malignant neoplasm of supraglottis   08/13/2013 Pathology Results [phone]: 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   08/13/2013 Procedure Laryngoscopy showed the right false vocal fold and the right and midline laryngeal surface of the epiglottis showed a fungating, irregular mass   Right now, he has mild sore throat. The current pain medication is controlling it.  MEDICAL HISTORY:  Past Medical History  Diagnosis Date  . Obesity   . Tobacco abuse   . Swallowing difficulty   . Schizophrenia   . 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 Hypertension   . Hyperlipidemia   . COPD (chronic obstructive pulmonary disease)   . Tobacco abuse 08/25/2013  . Tobacco abuse 08/25/2013    SURGICAL HISTORY: Past Surgical History  Procedure Laterality Date  . Direct laryngoscopy N/A 08/13/2013    Procedure: DIRECT LARYNGOSCOPY;  Surgeon: Ruby Cola, MD;  Location: [facility];  Service: ENT;  Laterality: N/A;  Direct Laryngoscopy with biopsy.  . Inguinal hernia repair      as 58 year old    SOCIAL HISTORY: History   Social History  . Marital Status: Single    Spouse Name: N/A   Number of Children: N/A  . Years of Education: N/A   Occupational History  . Not on file.   Social History Main Topics  . Smoking status: Current Every Day Smoker -- 0.50 packs/day for 20 years    Types: Cigarettes  . Smokeless tobacco: Never Used     Comment: 08/21/13 10-11 cigs daily  . Alcohol Use: No  . Drug Use: No  . Sexual Activity: Not on file   Other Topics Concern  . Not on file   Social History Narrative  . No narrative on file    FAMILY HISTORY: Family History  Problem Relation Age of Onset  . Cancer Mother     lung  . Cancer Father     lung    ALLERGIES:  has No Known Allergies.  MEDICATIONS:  Current Outpatient Prescriptions  Medication Sig Dispense Refill  . acetaminophen (TYLENOL) 325 MG tablet Take 650 mg by mouth every 6 (six) hours as needed (pain).      Marland Kitchen amoxicillin (AMOXIL) 400 MG/5ML suspension Take 400 mg by mouth 2 (two) times daily. Amoxicillin 400 mg/51ml.  Take 6 ml po BID      . aspirin EC 81 MG tablet Take 81 mg by mouth daily.      . budesonide-formoterol (SYMBICORT) 80-4.5 MCG/ACT inhaler Inhale 2 puffs into the lungs 2 (two) times daily.      . cholecalciferol (VITAMIN D) 400 UNITS TABS tablet Take 800 Units by mouth daily.      . cycloSPORINE (RESTASIS) 0.05 % ophthalmic emulsion Place  1 drop into both eyes 2 (two) times daily.      . hydrocerin (EUCERIN) CREA Apply 1 application topically 2 (two) times daily. Spread to bilateral palm twice daily      . HYDROCODONE-ACETAMINOPHEN PO Take 7.5 mg/mL by mouth as needed (Hydorcodone 7.5/325mg /35ml oral solution.  Take 15 ml every 4-6 hrs prn pain).      Marland Kitchen ibuprofen (ADVIL,MOTRIN) 400 MG tablet Take 400 mg by mouth every 6 (six) hours as needed for headache or mild pain.      Marland Kitchen lisinopril (PRINIVIL,ZESTRIL) 10 MG tablet Take 10 mg by mouth daily.      . ondansetron (ZOFRAN) 4 MG tablet Take 4 mg by mouth every 8 (eight) hours as needed for nausea or vomiting.      . risperiDONE (RISPERDAL) 2 MG  tablet Take 2 mg by mouth 2 (two) times daily.      . simvastatin (ZOCOR) 10 MG tablet Take 10 mg by mouth at bedtime.      . nicotine (NICODERM CQ - DOSED IN MG/24 HR) 7 mg/24hr patch Place 1 patch (7 mg total) onto the skin daily.  14 patch  0   No current facility-administered medications for this visit.    REVIEW OF SYSTEMS:   Constitutional: Denies fevers, chills or abnormal night sweats Eyes: Denies blurriness of vision, double vision or watery eyes Respiratory: Denies cough, dyspnea or wheezes Cardiovascular: Denies palpitation, chest discomfort or lower extremity swelling Gastrointestinal:  Denies nausea, heartburn or change in bowel habits Skin: Denies abnormal skin rashes Lymphatics: Denies new lymphadenopathy or easy bruising Neurological:Denies numbness, tingling or new weaknesses Behavioral/Psych: Mood is stable, no new changes  All other systems were reviewed with the patient and are negative.  PHYSICAL EXAMINATION: ECOG PERFORMANCE STATUS: 1 - Symptomatic but completely ambulatory  Filed Vitals:   08/25/13 1108  BP: 106/66  Pulse: 64  Temp: 97.8 F (36.6 C)  Resp: 20   Filed Weights   08/25/13 1108  Weight: 154 lb 1.6 oz (69.899 kg)    GENERAL:alert, no distress and comfortable SKIN: skin color, texture, turgor are normal, no rashes or significant lesions EYES: normal, conjunctiva are pink and non-injected, sclera clear OROPHARYNX:no exudate, no erythema and lips, buccal mucosa, and tongue normal . Very poor dentition is noted. NECK: supple, thyroid normal size, non-tender, without nodularity LYMPH:  no palpable lymphadenopathy in the cervical, axillary or inguinal LUNGS: clear to auscultation and percussion with normal breathing effort HEART: regular rate & rhythm and no murmurs and no lower extremity edema ABDOMEN:abdomen soft, non-tender and normal bowel sounds Musculoskeletal:no cyanosis of digits and no clubbing  PSYCH: alert & oriented x 3 with fluent  speech but with slight hoarseness. NEURO: no focal motor/sensory deficits  LABORATORY DATA:  I have reviewed the data as listed Lab Results  Component Value Date   WBC 10.0 08/13/2013   HGB 12.9* 08/13/2013   HCT 37.5* 08/13/2013   MCV 88.4 08/13/2013   PLT 224 08/13/2013   Lab Results  Component Value Date   NA 134* 08/13/2013   K 4.2 08/13/2013   CL 94* 08/13/2013   CO2 27 08/13/2013    ASSESSMENT:  Newly diagnosed squamous cell carcinoma of the Head & Neck, HPV Positive  PLAN:  Malignant neoplasm of supraglottis Personally, I could not appreciate enlarged lymphadenopathy on clinical examination. I am waiting for imaging study tomorrow and we'll review his case at the next ENT tumor board. If the patient has organ confined disease without  lymph node metastasis, he can proceed without concurrent chemotherapy.  Tobacco abuse I spent some time counseling the patient the importance of tobacco cessation. he is currently attempting to quit on his own. I gave him a prescription of nicotine patch to try.  Decay, teeth He has very poor dentition. A dentist has been consulted.  Anemia of other chronic disease This is likely anemia of chronic disease. The patient denies recent history of bleeding such as epistaxis, hematuria or hematochezia. He is asymptomatic from the anemia. We will observe for now.       All questions were answered. The patient knows to call the clinic with any problems, questions or concerns. I spent 40 minutes counseling the patient face to face. The total time spent in the appointment was 60 minutes and more than 50% was on counseling.     [facility], [place], MD 08/25/2013 12:47 PM

## 2013-08-26 ENCOUNTER — Encounter (HOSPITAL_COMMUNITY): Payer: Self-pay

## 2013-08-26 ENCOUNTER — Encounter (HOSPITAL_COMMUNITY)
Admission: RE | Admit: 2013-08-26 | Discharge: 2013-08-26 | Disposition: A | Discharge status: Home / Self Care | Payer: Medicare Other | Source: Ambulatory Visit | Attending: Radiation Oncology | Admitting: Radiation Oncology

## 2013-08-26 ENCOUNTER — Ambulatory Visit (HOSPITAL_COMMUNITY)
Admission: RE | Admit: 2013-08-26 | Discharge: 2013-08-26 | Disposition: A | Discharge status: Home / Self Care | Payer: Medicare Other | Source: Ambulatory Visit | Attending: Radiation Oncology | Admitting: Radiation Oncology

## 2013-08-26 DIAGNOSIS — C321 Malignant neoplasm of supraglottis: Secondary | ICD-10-CM

## 2013-08-26 LAB — GLUCOSE, CAPILLARY: Glucose-Capillary: 92 mg/dL (ref 70–99)

## 2013-08-26 MED ORDER — FLUDEOXYGLUCOSE F - 18 (FDG) INJECTION: 8.1000 | Freq: Once | INTRAVENOUS | Status: AC | PRN

## 2013-08-26 MED ORDER — IOHEXOL 300 MG/ML  SOLN
100.0000 mL | Freq: Once | INTRAMUSCULAR | Status: AC | PRN
  Administered 2013-08-26: 100 mL via INTRAVENOUS

## 2013-09-02 ENCOUNTER — Ambulatory Visit (HOSPITAL_COMMUNITY): Payer: Self-pay | Admitting: Dentistry

## 2013-09-02 ENCOUNTER — Telehealth: Payer: Self-pay | Admitting: *Deleted

## 2013-09-02 ENCOUNTER — Encounter (HOSPITAL_COMMUNITY): Payer: Self-pay | Admitting: Dentistry

## 2013-09-02 ENCOUNTER — Telehealth: Payer: Self-pay | Admitting: Hematology and Oncology

## 2013-09-02 ENCOUNTER — Other Ambulatory Visit: Payer: Self-pay | Admitting: Hematology and Oncology

## 2013-09-02 VITALS — BP 113/73 | HR 73 | Temp 98.7°F

## 2013-09-02 DIAGNOSIS — K045 Chronic apical periodontitis: Secondary | ICD-10-CM

## 2013-09-02 DIAGNOSIS — K036 Deposits [accretions] on teeth: Secondary | ICD-10-CM

## 2013-09-02 DIAGNOSIS — C321 Malignant neoplasm of supraglottis: Secondary | ICD-10-CM

## 2013-09-02 DIAGNOSIS — K0530 Chronic periodontitis, unspecified: Secondary | ICD-10-CM

## 2013-09-02 DIAGNOSIS — IMO0002 Reserved for concepts with insufficient information to code with codable children: Secondary | ICD-10-CM

## 2013-09-02 DIAGNOSIS — M264 Malocclusion, unspecified: Secondary | ICD-10-CM

## 2013-09-02 DIAGNOSIS — K0889 Other specified disorders of teeth and supporting structures: Secondary | ICD-10-CM

## 2013-09-02 DIAGNOSIS — K029 Dental caries, unspecified: Secondary | ICD-10-CM

## 2013-09-02 DIAGNOSIS — K08109 Complete loss of teeth, unspecified cause, unspecified class: Secondary | ICD-10-CM

## 2013-09-02 DIAGNOSIS — M270 Developmental disorders of jaws: Secondary | ICD-10-CM

## 2013-09-02 DIAGNOSIS — Z0189 Encounter for other specified special examinations: Secondary | ICD-10-CM

## 2013-09-02 NOTE — Telephone Encounter (Signed)
Call from IR they can schedule pt fo 8/11 for PEG/PAC but it conflicts w/ chemo teaching and nutrition appt. Informed them we can r/s the chemo education and nutrition to another day.  Order sent to Scheduler to r/s.

## 2013-09-02 NOTE — Telephone Encounter (Signed)
, °

## 2013-09-02 NOTE — Progress Notes (Signed)
DENTAL CONSULTATION  Date of Consultation:  09/02/2013 Patient Name:   Andrew Osborn Date of Birth:   1955-07-05 Medical Record Number: [id number]  VITALS: BP 113/73  Pulse 73  Temp(Src) 98.7 F (37.1 C) (Oral)   CHIEF COMPLAINT: The patient was referred for a pre-chemoradiation therapy dental protocol consultation.  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 Patient with anticipated chemoradiation therapy. Patient is now seen as part of a medically necessary pre-chemoradiation therapy dental protocol examination.  The patient currently denies acute toothache, swellings, or abscesses. Patient last saw a dentist over 10 years ago. Patient had 7 teeth pulled at that time. Patient thinks it was a Pharmacist, community on " [place]". Patient denies having any complications from those dental extractions. Patient denies having any partial dentures.   PROBLEM LIST: Patient Active Problem List   Diagnosis Date Noted  . Malignant neoplasm of supraglottis 08/21/2013    Priority: High  . Tobacco abuse 08/25/2013  . Decay, teeth 08/25/2013  . Anemia of other chronic disease 08/25/2013    PMH: Past Medical History  Diagnosis Date  . Obesity   . Swallowing difficulty   . Schizophrenia   . 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 Hypertension   . Hyperlipidemia   . COPD (chronic obstructive pulmonary disease)   . Tobacco abuse 08/25/2013    PSH: Past Surgical History  Procedure Laterality Date  . Direct laryngoscopy N/A 08/13/2013    Procedure: DIRECT LARYNGOSCOPY;  Surgeon: Ruby Cola, MD;  Location: [facility];  Service: ENT;  Laterality: N/A;  Direct Laryngoscopy with biopsy.  . Inguinal hernia repair      as 58 year old    ALLERGIES: No Known Allergies  MEDICATIONS: Current Outpatient Prescriptions  Medication Sig Dispense Refill  . acetaminophen (TYLENOL) 325 MG tablet Take 650 mg by  mouth every 6 (six) hours as needed (pain).      Marland Kitchen aspirin EC 81 MG tablet Take 81 mg by mouth daily.      . cholecalciferol (VITAMIN D) 400 UNITS TABS tablet Take 800 Units by mouth daily.      . cycloSPORINE (RESTASIS) 0.05 % ophthalmic emulsion Place 1 drop into both eyes 2 (two) times daily.      . hydrocerin (EUCERIN) CREA Apply 1 application topically 2 (two) times daily. Spread to bilateral palm twice daily      . HYDROCODONE-ACETAMINOPHEN PO Take 7.5 mg/mL by mouth as needed (Hydorcodone 7.5/325mg /64ml oral solution.  Take 15 ml every 4-6 hrs prn pain).      Marland Kitchen ibuprofen (ADVIL,MOTRIN) 400 MG tablet Take 400 mg by mouth every 6 (six) hours as needed for headache or mild pain.      Marland Kitchen lisinopril (PRINIVIL,ZESTRIL) 10 MG tablet Take 10 mg by mouth daily.      . risperiDONE (RISPERDAL) 2 MG tablet Take 2 mg by mouth 2 (two) times daily.      . simvastatin (ZOCOR) 10 MG tablet Take 10 mg by mouth at bedtime.      Marland Kitchen amoxicillin (AMOXIL) 400 MG/5ML suspension Take 400 mg by mouth 2 (two) times daily. Amoxicillin 400 mg/60ml.  Take 6 ml po BID      . nicotine (NICODERM CQ - DOSED IN MG/24 HR) 7 mg/24hr patch Place 1 patch (7 mg total) onto the skin daily.  14 patch  0  . ondansetron (ZOFRAN) 4 MG tablet Take 4 mg by mouth  every 8 (eight) hours as needed for nausea or vomiting.       No current facility-administered medications for this visit.    LABS: Lab Results  Component Value Date   WBC 10.0 08/13/2013   HGB 12.9* 08/13/2013   HCT 37.5* 08/13/2013   MCV 88.4 08/13/2013   PLT 224 08/13/2013      Component Value Date/Time   NA 134* 08/13/2013 0950   K 4.2 08/13/2013 0950   CL 94* 08/13/2013 0950   CO2 27 08/13/2013 0950   GLUCOSE 106* 08/13/2013 0950   BUN 7 08/13/2013 0950   CREATININE 0.63 08/13/2013 0950   CALCIUM 9.5 08/13/2013 0950   GFRNONAA >90 08/13/2013 0950   GFRAA >90 08/13/2013 0950   No results found for this basename: INR, PROTIME   No results found for this basename: PTT     SOCIAL HISTORY: History   Social History  . Marital Status: Single    Spouse Name: N/A    Number of Children: N/A  . Years of Education: N/A   Occupational History  . Not on file.   Social History Main Topics  . Smoking status: Current Every Day Smoker -- 0.50 packs/day for 20 years    Types: Cigarettes  . Smokeless tobacco: Never Used     Comment: 08/21/13 10-11 cigs daily  . Alcohol Use: No  . Drug Use: No  . Sexual Activity: Not on file   Other Topics Concern  . Not on file   Social History Narrative  . No narrative on file    FAMILY HISTORY: Family History  Problem Relation Age of Onset  . Cancer Mother     lung cancer  . Cancer Father     lung cancer    REVIEW OF SYSTEMS: Reviewed with patient and included in dental record.  DENTAL HISTORY: CHIEF COMPLAINT: The patient was referred for a pre-chemoradiation therapy dental protocol consultation.  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 Patient with anticipated chemoradiation therapy. Patient is now seen as part of a medically necessary pre-chemoradiation therapy dental protocol examination.  Patient currently denies acute toothache, swellings, or abscesses. Patient last saw a dentist over 10 years ago. Patient had 7 teeth pulled at that time. Patient thinks it was a Pharmacist, community on " [place]". Patient denies having any complications from those dental extractions. Patient denies having any partial dentures.   DENTAL EXAMINATION:  GENERAL: The patient is a well-developed, well-nourished male in no acute distress.  HEAD AND NECK: Patient has right neck lymphadenopathy. Patient denies acute TMJ symptoms. INTRAORAL EXAM: The patient has normal saliva. Patient has bilateral mandibular lingual tori. I do not see any evidence of abscess formation. DENTITION: The patient is missing tooth numbers 1, 2, 6, 16, 17 and 18, 19, 20, 21,  29, 30, and 32. There are retained roots in the area tooth numbers 5, 7, 11, 26, 28, and possibly #31. PERIODONTAL: The patient has chronic periodontitis with plaque and calculus accumulations, gingival recession, and tooth mobility. There is moderate bone loss noted.  DENTAL CARIES/SUBOPTIMAL RESTORATIONS:  The patient has multiple dental caries as per dental charting form. There is mandibular anterior incisal attrition. ENDODONTIC: The patient currently denies acute pulpitis symptoms. Patient does have multiple areas of periapical pathology and radiolucency associated with tooth numbers 3, 11, 28, and 31.  The patient has had multiple root canal therapies. CROWN AND BRIDGE: The patient has crowns on  tooth numbers 3, 4, and 14. There are recurrent caries associated crowns 3 and 4.  PROSTHODONTIC: The patient denies having any partial dentures. OCCLUSION: The patient has a poor occlusal scheme secondary to multiple missing teeth, multiple retained root segments, and lack of replacement of missing teeth with dental prostheses.   RADIOGRAPHIC INTERPRETATION:  An orthopantogram was taken and supplemented with 11 periapical radiographs. There are multiple missing teeth. There are multiple retained root segments. There are multiple dental caries. There are multiple areas of periapical pathology and radiolucency. There are radiopacities consistent with bilateral mandibular lingual tori. There is moderate bone loss.    ASSESSMENTS: 1. Squamous cell carcinoma of right supraglottis 2. Pre-chemoradiation therapy dental protocol 3. Chronic apical periodontitis 4. Multiple dental caries 5. Multiple retained root segments 6. 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 Chronic periodontitis with bone loss 9. Gingival recession 10. Accretions 11. Tooth mobility 12. Multiple missing teeth 13.  No history of partial dentures 14. Malocclusion  15. Potential for airway  compromise with invasive dental procedures performed under general anesthesia.  PLAN/RECOMMENDATIONS: 1. I discussed the risks, benefits, and complications of various treatment options with the patient in relationship to his medical and dental conditions, anticipated chemoradiation therapy, and chemoradiation therapy side effects to include xerostomia, radiation caries, trismus, mucositis, taste changes, gum and jawbone changes, and risk for infection and osteoradionecrosis. We discussed various treatment options to include no treatment, total and subtotal extractions with alveoloplasty, pre-prosthetic surgery as indicated, periodontal therapy, dental restorations, root canal therapy, crown and bridge therapy, implant therapy, and replacement of missing teeth as indicated. We also discussed extraction of remaining teeth before chemoradiation therapy or after chemoradiation therapy. We also discussed fabrication of scatter protection devices. The patient currently wishes to defer dental extractions at this time but will consider extraction remaining teeth with alveoloplasty and pre-prosthetic surgery as indicated in the operating room with general anesthesia after chemoradiation therapy. The patient will then followup with a dentist of his choice for fabrication of upper and lower complete dentures after adequate healing. The patient did agree to have impressions today for the fabrication of scatter protection devices. These will be inserted tomorrow. Dr. Isidore Moos will then schedule simulation on Friday as time and space permits in her schedule. Dr. Isidore Moos will contact the patient to arrange for this appointment.   2. Discussion of findings with medical team and coordination of future medical and dental care as needed.  I spent 75 minutes face to face with patient and more than 50% of time was spent in counseling and /or coordination of care.   Lenn Cal, DDS

## 2013-09-02 NOTE — Patient Instructions (Signed)

## 2013-09-02 NOTE — Telephone Encounter (Signed)
Per [facility] staff phone call scheduled appts. Advised schedulers 

## 2013-09-03 ENCOUNTER — Other Ambulatory Visit: Payer: Self-pay | Admitting: Radiology

## 2013-09-03 ENCOUNTER — Encounter (HOSPITAL_COMMUNITY): Payer: Self-pay | Admitting: Pharmacy Technician

## 2013-09-03 ENCOUNTER — Encounter (HOSPITAL_COMMUNITY): Payer: Self-pay | Admitting: Dentistry

## 2013-09-03 ENCOUNTER — Ambulatory Visit (HOSPITAL_COMMUNITY): Payer: Medicaid - Dental | Admitting: Dentistry

## 2013-09-03 VITALS — BP 108/60 | HR 76 | Temp 98.4°F

## 2013-09-03 DIAGNOSIS — Z0189 Encounter for other specified special examinations: Secondary | ICD-10-CM

## 2013-09-03 DIAGNOSIS — Z463 Encounter for fitting and adjustment of dental prosthetic device: Secondary | ICD-10-CM

## 2013-09-03 DIAGNOSIS — C321 Malignant neoplasm of supraglottis: Secondary | ICD-10-CM

## 2013-09-03 NOTE — Patient Instructions (Signed)
Patient to return to clinic in 2-3 weeks for oral evaluation during radiation therapy. Dr. Enrique Sack

## 2013-09-03 NOTE — Progress Notes (Signed)
09/03/2013  Patient:            Andrew Osborn Date of Birth:  02/09/1955 MRN:                [id number]  BP 108/60  Pulse 76  Temp(Src) 98.4 F (36.9 C) (Oral)  [name] now presents for insertion of upper and lower scatter protection devices.  PROCEDURE: Appliances were tried in and adjusted as needed. [place] Trismus device was previously fabricated at 50 mm using 30 sticks. Postop instructions were provided and a written and verbal format concerning the use and care of appliances. All questions were answered. Patient to return to clinic for periodic oral examination in approximately 2-3 weeks during radiation therapy. Patient to call if questions or problems arise before then.  Lenn Cal, DDS

## 2013-09-04 ENCOUNTER — Ambulatory Visit: Payer: Medicare Other

## 2013-09-04 ENCOUNTER — Ambulatory Visit: Admission: RE | Admit: 2013-09-04 | Payer: Medicare Other | Source: Ambulatory Visit | Admitting: Radiation Oncology

## 2013-09-06 ENCOUNTER — Other Ambulatory Visit: Payer: Self-pay | Admitting: Hematology and Oncology

## 2013-09-06 DIAGNOSIS — C321 Malignant neoplasm of supraglottis: Secondary | ICD-10-CM

## 2013-09-07 ENCOUNTER — Other Ambulatory Visit: Payer: Self-pay | Admitting: Hematology and Oncology

## 2013-09-07 ENCOUNTER — Encounter: Payer: Self-pay | Admitting: *Deleted

## 2013-09-07 ENCOUNTER — Other Ambulatory Visit: Payer: Self-pay | Admitting: Radiology

## 2013-09-07 ENCOUNTER — Other Ambulatory Visit (HOSPITAL_BASED_OUTPATIENT_CLINIC_OR_DEPARTMENT_OTHER): Payer: Medicare Other

## 2013-09-07 ENCOUNTER — Telehealth: Payer: Self-pay | Admitting: Hematology and Oncology

## 2013-09-07 ENCOUNTER — Other Ambulatory Visit: Payer: Self-pay | Admitting: *Deleted

## 2013-09-07 ENCOUNTER — Encounter: Payer: Self-pay | Admitting: Hematology and Oncology

## 2013-09-07 ENCOUNTER — Ambulatory Visit (HOSPITAL_BASED_OUTPATIENT_CLINIC_OR_DEPARTMENT_OTHER): Payer: Medicare Other | Admitting: Hematology and Oncology

## 2013-09-07 ENCOUNTER — Ambulatory Visit: Payer: Medicare Other | Attending: Radiation Oncology

## 2013-09-07 ENCOUNTER — Ambulatory Visit
Admission: RE | Admit: 2013-09-07 | Discharge: 2013-09-07 | Disposition: A | Discharge status: Home / Self Care | Payer: Medicare Other | Source: Ambulatory Visit | Attending: Radiation Oncology | Admitting: Radiation Oncology

## 2013-09-07 ENCOUNTER — Ambulatory Visit: Payer: Medicare Other

## 2013-09-07 VITALS — BP 110/67 | HR 71 | Temp 98.3°F | Resp 18 | Ht 70.0 in | Wt 155.0 lb

## 2013-09-07 VITALS — BP 113/63 | HR 87 | Temp 99.1°F | Ht 70.5 in

## 2013-09-07 DIAGNOSIS — C321 Malignant neoplasm of supraglottis: Secondary | ICD-10-CM

## 2013-09-07 DIAGNOSIS — IMO0001 Reserved for inherently not codable concepts without codable children: Secondary | ICD-10-CM | POA: Diagnosis present

## 2013-09-07 DIAGNOSIS — Z720 Tobacco use: Secondary | ICD-10-CM

## 2013-09-07 DIAGNOSIS — F209 Schizophrenia, unspecified: Secondary | ICD-10-CM | POA: Insufficient documentation

## 2013-09-07 DIAGNOSIS — R1310 Dysphagia, unspecified: Secondary | ICD-10-CM | POA: Insufficient documentation

## 2013-09-07 DIAGNOSIS — F17200 Nicotine dependence, unspecified, uncomplicated: Secondary | ICD-10-CM

## 2013-09-07 DIAGNOSIS — I10 Essential (primary) hypertension: Secondary | ICD-10-CM | POA: Insufficient documentation

## 2013-09-07 DIAGNOSIS — Z510 Encounter for antineoplastic radiation therapy: Secondary | ICD-10-CM | POA: Diagnosis not present

## 2013-09-07 DIAGNOSIS — J988 Other specified respiratory disorders: Secondary | ICD-10-CM | POA: Insufficient documentation

## 2013-09-07 LAB — COMPREHENSIVE METABOLIC PANEL
ALBUMIN: 3.3 g/dL — AB (ref 3.5–5.2)
ALT: 7 U/L (ref 0–53)
AST: 11 U/L (ref 0–37)
Alkaline Phosphatase: 92 U/L (ref 39–117)
BUN: 11 mg/dL (ref 6–23)
CALCIUM: 9.7 mg/dL (ref 8.4–10.5)
CHLORIDE: 93 meq/L — AB (ref 96–112)
CO2: 27 mEq/L (ref 19–32)
Creatinine, Ser: 0.7 mg/dL (ref 0.50–1.35)
Glucose, Bld: 130 mg/dL — ABNORMAL HIGH (ref 70–99)
Potassium: 4.5 mEq/L (ref 3.5–5.3)
Sodium: 131 mEq/L — ABNORMAL LOW (ref 135–145)
Total Bilirubin: <0.2 mg/dL — ABNORMAL LOW (ref 0.2–1.2)
Total Protein: 6.5 g/dL (ref 6.0–8.3)

## 2013-09-07 LAB — CBC WITH DIFFERENTIAL/PLATELET
BASO%: 0.2 % (ref 0.0–2.0)
Basophils Absolute: 0 10*3/uL (ref 0.0–0.1)
EOS%: 2.7 % (ref 0.0–7.0)
Eosinophils Absolute: 0.3 10*3/uL (ref 0.0–0.5)
HCT: 35.8 % — ABNORMAL LOW (ref 38.4–49.9)
HGB: 12.3 g/dL — ABNORMAL LOW (ref 13.0–17.1)
LYMPH#: 1.8 10*3/uL (ref 0.9–3.3)
LYMPH%: 19 % (ref 14.0–49.0)
MCH: 30.3 pg (ref 27.2–33.4)
MCHC: 34.4 g/dL (ref 32.0–36.0)
MCV: 88.2 fL (ref 79.3–98.0)
MONO#: 1.1 10*3/uL — ABNORMAL HIGH (ref 0.1–0.9)
MONO%: 11.2 % (ref 0.0–14.0)
NEUT#: 6.4 10*3/uL (ref 1.5–6.5)
NEUT%: 66.9 % (ref 39.0–75.0)
Platelets: 190 10*3/uL (ref 140–400)
RBC: 4.06 10*6/uL — ABNORMAL LOW (ref 4.20–5.82)
RDW: 13.6 % (ref 11.0–14.6)
WBC: 9.5 10*3/uL (ref 4.0–10.3)

## 2013-09-07 LAB — MAGNESIUM: Magnesium: 2.1 mg/dL (ref 1.5–2.5)

## 2013-09-07 MED ORDER — SODIUM CHLORIDE 0.9 % IJ SOLN
10.0000 mL | Freq: Once | INTRAMUSCULAR | Status: AC
  Administered 2013-09-07: 10 mL via INTRAVENOUS

## 2013-09-07 MED ORDER — LIDOCAINE-PRILOCAINE 2.5-2.5 % EX CREA: 1.0000 "application " | TOPICAL_CREAM | CUTANEOUS | Status: DC | PRN

## 2013-09-07 MED ORDER — PROMETHAZINE HCL 25 MG PO TABS: 25.0000 mg | ORAL_TABLET | Freq: Four times a day (QID) | ORAL | Status: DC | PRN

## 2013-09-07 NOTE — Assessment & Plan Note (Signed)
We discussed the role of chemotherapy. The intent is for cure.  We discussed some of the risks, benefits, side-effects of cisplatin. Some of the short term side-effects included, though not limited to, including weight loss, life threatening infections, risk of allergic reactions, need for transfusions of blood products, nausea, vomiting, change in bowel habits, loss of hair, admission to hospital for various reasons, and risks of death.   Long term side-effects are also discussed including risks of infertility, permanent damage to nerve function, hearing loss, chronic fatigue, kidney damage with possibility needing hemodialysis, and rare secondary malignancy including bone marrow disorders.  The patient is aware that the response rates discussed earlier is not guaranteed.  After a long discussion, patient made an informed decision to proceed with the prescribed plan of care. Patient education material was dispensed. Due to his background of schizophrenia, his risk of side effects is high. I will monitor him for side effects closely.

## 2013-09-07 NOTE — Assessment & Plan Note (Signed)
This is currently stable. I will monitor for relapse closely.

## 2013-09-07 NOTE — Progress Notes (Signed)
Andrew Osborn in today for IV start for CT simulation planning.  Very pleasant and coorperative.  Initial IV in the right forearm,but without any blood return, therefore discontinued.  2nd attempt in the  Right ventral surface of the right arm with brisk blood return.  Secured and escorted to simulation.  He is not a diabetic and denies any past reaction to CT contrast.

## 2013-09-07 NOTE — Progress Notes (Signed)
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   08/13/2013 Procedure Laryngoscopy showed the right false vocal fold and the right and midline laryngeal surface of the epiglottis showed a fungating, irregular mass   08/26/2013 Imaging Right supraglottic mass with metastatic bilateral level II and III lymph nodes. Resultant severe narrowing of the airway at or just above the vocal folds.      INTERVAL HISTORY: Please see below for problem oriented charting. He is feeling well. He denies sore throat. He is motivated to quit smoking. I reviewed his case recently at ENT tumor board and felt that the patient would best benefit from concurrent chemoradiation therapy.  REVIEW OF SYSTEMS:   Constitutional: Denies fevers, chills or abnormal weight loss Eyes: Denies blurriness of vision Ears, nose, mouth, throat, and face: Denies mucositis or sore throat Respiratory: Denies cough, dyspnea or wheezes Cardiovascular: Denies palpitation, chest discomfort or lower extremity swelling Gastrointestinal:  Denies nausea, heartburn or change in bowel habits Skin: Denies abnormal skin rashes Lymphatics: Denies new lymphadenopathy or easy bruising Neurological:Denies numbness, tingling or new weaknesses Behavioral/Psych: Mood is stable, no new changes  All other systems were  reviewed with the patient and are negative.  I have reviewed the past medical history, past surgical history, social history and family history with the patient and they are unchanged from previous note.  ALLERGIES:  has No Known Allergies.  MEDICATIONS:  Current Outpatient Prescriptions  Medication Sig Dispense Refill  . acetaminophen (TYLENOL) 325 MG tablet Take 650 mg by mouth every 6 (six) hours as needed (pain).      Marland Kitchen aspirin EC 81 MG tablet Take 81 mg by mouth daily.      . cholecalciferol (VITAMIN D) 400 UNITS TABS tablet Take 800 Units by mouth daily.      . cycloSPORINE (RESTASIS) 0.05 % ophthalmic emulsion Place 1 drop into both eyes 2 (two) times daily.      Marland Kitchen guaifenesin (ROBITUSSIN) 100 MG/5ML syrup Take 200 mg by mouth 3 (three) times daily as needed for cough.      . hydrocerin (EUCERIN) CREA Apply 1 application topically 2 (two) times daily. Spread to bilateral palm twice daily      . HYDROCODONE-ACETAMINOPHEN PO Take 15 mLs by mouth every 4 (four) hours as needed (Hydorcodone 7.5/325mg /48ml oral solution.Pain).       Marland Kitchen ibuprofen (ADVIL,MOTRIN) 400 MG tablet Take 400 mg by mouth every 6 (six) hours as needed for headache or mild pain.      Marland Kitchen lisinopril (PRINIVIL,ZESTRIL) 10 MG tablet Take 10 mg by mouth every morning.       . ondansetron (ZOFRAN) 4 MG tablet Take 4 mg by mouth every 8 (eight) hours as needed for nausea or vomiting.      Marland Kitchen OVER THE COUNTER MEDICATION Take 237 mLs by mouth 3 (three) times daily. [employer]  Shakes      . risperiDONE (RISPERDAL) 2 MG tablet Take 2 mg by mouth 2 (two) times daily.      . simvastatin (ZOCOR) 10 MG tablet Take 10 mg by mouth at bedtime.      . lidocaine-prilocaine (EMLA) cream Apply 1 application topically as needed.  30 g  6  . promethazine (PHENERGAN) 25 MG tablet Take 1 tablet (25 mg total) by mouth every 6 (six) hours as needed for nausea.  60 tablet  3   No current facility-administered medications for this visit.    PHYSICAL  EXAMINATION: ECOG PERFORMANCE STATUS: 0 - Asymptomatic  Filed Vitals:   09/07/13 1537  BP: 110/67  Pulse: 71  Temp: 98.3 F (36.8 C)  Resp: 18   Filed Weights   09/07/13 1537  Weight: 155 lb (70.308 kg)    GENERAL:alert, no distress and comfortable SKIN: skin color, texture, turgor are normal, no rashes or significant lesions EYES: normal, Conjunctiva are pink and non-injected, sclera clear OROPHARYNX:no exudate, no erythema and lips, buccal mucosa, and tongue normal . Poor dentition is noted Musculoskeletal:no cyanosis of digits and no clubbing  NEURO: alert & oriented x 3 with fluent speech, no focal motor/sensory deficits  LABORATORY DATA:  I have reviewed the data as listed    Component Value Date/Time   NA 131* 09/07/2013 1430   K 4.5 09/07/2013 1430   CL 93* 09/07/2013 1430   CO2 27 09/07/2013 1430   GLUCOSE 130* 09/07/2013 1430   BUN 11 09/07/2013 1430   CREATININE 0.70 09/07/2013 1430   CALCIUM 9.7 09/07/2013 1430   PROT 6.5 09/07/2013 1430   ALBUMIN 3.3* 09/07/2013 1430   AST 11 09/07/2013 1430   ALT 7 09/07/2013 1430   ALKPHOS 92 09/07/2013 1430   BILITOT <0.2* 09/07/2013 1430   GFRNONAA >90 08/13/2013 0950   GFRAA >90 08/13/2013 0950    No results found for this basename: SPEP, UPEP,  kappa and lambda light chains    Lab Results  Component Value Date   WBC 9.5 09/07/2013   NEUTROABS 6.4 09/07/2013   HGB 12.3* 09/07/2013   HCT 35.8* 09/07/2013   MCV 88.2 09/07/2013   PLT 190 09/07/2013      Chemistry      Component Value Date/Time   NA 131* 09/07/2013 1430   K 4.5 09/07/2013 1430   CL 93* 09/07/2013 1430   CO2 27 09/07/2013 1430   BUN 11 09/07/2013 1430   CREATININE 0.70 09/07/2013 1430      Component Value Date/Time   CALCIUM 9.7 09/07/2013 1430   ALKPHOS 92 09/07/2013 1430   AST 11 09/07/2013 1430   ALT 7 09/07/2013 1430   BILITOT <0.2* 09/07/2013 1430       RADIOGRAPHIC STUDIES: I reviewed the most recent PET scan. I have personally reviewed the radiological  images as listed and agreed with the findings in the report.  ASSESSMENT & PLAN:  Malignant neoplasm of supraglottis We discussed the role of chemotherapy. The intent is for cure.  We discussed some of the risks, benefits, side-effects of cisplatin. Some of the short term side-effects included, though not limited to, including weight loss, life threatening infections, risk of allergic reactions, need for transfusions of blood products, nausea, vomiting, change in bowel habits, loss of hair, admission to hospital for various reasons, and risks of death.   Long term side-effects are also discussed including risks of infertility, permanent damage to nerve function, hearing loss, chronic fatigue, kidney damage  with possibility needing hemodialysis, and rare secondary malignancy including bone marrow disorders.  The patient is aware that the response rates discussed earlier is not guaranteed.  After a long discussion, patient made an informed decision to proceed with the prescribed plan of care. Patient education material was dispensed. Due to his background of schizophrenia, his risk of side effects is high. I will monitor him for side effects closely.   Tobacco abuse I spent some time counseling the patient the importance of tobacco cessation. he is currently attempting to quit on his own.  Hypertension His blood pressure is normal. I told him to discontinue lisinopril due to risk of renal failure.  Schizophrenia This is currently stable. I will monitor for relapse closely.  Narrowing of airway Clinically, he has no signs of airway obstruction. We will monitored for this closely.     No orders of the defined types were placed in this encounter.   All questions were answered. The patient knows to call the clinic with any problems, questions or concerns. No barriers to learning was detected. I spent 40 minutes counseling the patient face to face. The total time spent in the  appointment was 55 minutes and more than 50% was on counseling and review of test results     [facility], [place], MD 09/07/2013 3:52 PM

## 2013-09-07 NOTE — Assessment & Plan Note (Signed)
His blood pressure is normal. I told him to discontinue lisinopril due to risk of renal failure.

## 2013-09-07 NOTE — Progress Notes (Signed)
Simulation, IMRT treatment planning, and Special treatment procedure note   Outpatient  Diagnosis:    ICD-9-CM  1. Malignant neoplasm of supraglottis 161.1    The patient was taken to the CT simulator and laid in the supine position on the table. An Aquaplast head and shoulder mask was custom fitted to the patient's anatomy. High-resolution CT axial imaging was obtained of the head and neck with contrast. I verified that the quality of the imaging is good for treatment planning. 1 Medically Necessary Treatment Device was fabricated and supervised by me: Aquaplast mask.   Treatment planning note I plan to treat the patient with helical Tomotherapy, IMRT. I plan to treat the patient's tumor and bilateral neck nodes. I plan to treat to a total dose of 70 Gray in 35  fractions   IMRT planning Note  IMRT is an important modality to deliver adequate dose to the patient's at risk tissues while sparing the patient's normal structures, including the: esophagus, parotid tissue, mandible, brain stem, spinal cord, oral cavity, brachial plexus.  This justifies the use of IMRT in the patient's treatment.   Special Treatment Procedure Note:  The patient will be receiving chemotherapy concurrently. Chemotherapy heightens the risk of side effects. I have considered this during the patient's treatment planning process and will monitor the patient accordingly for side effects on a weekly basis. Concurrent chemotherapy increases the complexity of this patient's treatment and therefore this constitutes a special treatment procedure.  -----------------------------------  Eppie Gibson, MD

## 2013-09-07 NOTE — Assessment & Plan Note (Signed)
I spent some time counseling the patient the importance of tobacco cessation. he is currently attempting to quit on his own 

## 2013-09-07 NOTE — Assessment & Plan Note (Signed)
Clinically, he has no signs of airway obstruction. We will monitored for this closely.

## 2013-09-07 NOTE — Progress Notes (Signed)
To provide support, encouragement and care continuity, met with patient and his caregiver [name] during appt with Dr. Alvy Bimler.  Provided additional explanation re: newly prescribed medications Phenergan and Emla cream.  Caregiver voiced understanding.  Gayleen Orem, RN, BSN, [facility] Head & Neck Oncology Navigator [phone]

## 2013-09-07 NOTE — Progress Notes (Signed)
To provide support and encouragement, continuity of care: 1)  Met with Greg during [facility] appt, after which I DC'd IV, documented appropriately; showed him again [facility] 4 tmt area, dressing rooms, explained arrival and preparation procedure, tmt procedure.   He verbalized understanding. 2)  Provided him an Epic appt calendar for remainder of August, reviewed appt remaining appt times for today, appts for tomorrow.  Explained that he had to check-in for these appts.  Caregiver Dennison Bulla present and also made aware. 3)  Confirmed with [name] that when appts scheduled, Schedulers are to call facility phone # documented in [facility]. Continuing navigation as L1 patient (new patient).  Gayleen Orem, RN, BSN, [facility] Head & Neck Oncology Navigator [phone]

## 2013-09-08 ENCOUNTER — Ambulatory Visit: Payer: Medicare Other | Admitting: Nutrition

## 2013-09-08 ENCOUNTER — Telehealth: Payer: Self-pay | Admitting: *Deleted

## 2013-09-08 ENCOUNTER — Ambulatory Visit (HOSPITAL_COMMUNITY): Admission: RE | Admit: 2013-09-08 | Payer: Medicare Other | Source: Ambulatory Visit

## 2013-09-08 ENCOUNTER — Other Ambulatory Visit: Payer: Medicare Other

## 2013-09-08 ENCOUNTER — Encounter: Payer: Self-pay | Admitting: *Deleted

## 2013-09-08 NOTE — Progress Notes (Signed)
58 year old male diagnosed with cancer of the vocal cord.  He is a patient of Dr. Alvy Bimler.  Past medical history includes obesity, tobacco, schizophrenia, hypertension, hyperlipidemia, COPD, and swallowing difficulties.  Medications include vitamin D, Zofran, Resperdal, Zocor, and Phenergan.    Labs include sodium 131, glucose 130, albumin 3.3 on August 10.  Height: 70.5 inches. Weight: 155 pounds August 10. Usual body weight: 230 pounds 4 years ago per patient. BMI: 22.24.  Patient states he has lost about 100 pounds over the past 4 years.  He has poor dentition.  Patient was scheduled to have a feeding tube placed however this has been delayed.  Patient reports the food where he lives tastes good and he can get a good variety.  He eats meals and snacks.  Nutrition diagnosis: Predicted suboptimal energy intake related to diagnosis of vocal cord cancer and associated treatments as evidenced by no prior need for nutrition related information.  Intervention: Educated patient to consume meals with snacks 3 times a day. Educated patient on high-protein foods. Provided oral nutrition supplement samples for patient and suggested he may want to try between meals. Provided a fact sheet for patient. Questions were answered.  Teach back method used.  Contact information given.  Monitoring, evaluation, goals: Patient will tolerate adequate calories and protein to minimize weight loss throughout treatment.  Will monitor for tube placement.  Next visit: Wednesday, August 19, during chemotherapy.  **Disclaimer: This note was dictated with voice recognition software. Similar sounding words can inadvertently be transcribed and this note may contain transcription errors which may not have been corrected upon publication of note.**

## 2013-09-08 NOTE — Telephone Encounter (Signed)
Called [place] this morning to follow-up on cancelled apt for [facility] and PEG placement, spoke with Vaughan Basta, Marketing executive.  She confirmed that assistant provided patient a small amount of cereal this morning, not realizing he was NPO.  I confirmed with her that he should keep his 12:00 chemo ed and 1:45 nutrition appts at [facility], she verbalized understanding.  Gayleen Orem, RN, BSN, [place] [facility] [place] [phone]

## 2013-09-11 DIAGNOSIS — Z510 Encounter for antineoplastic radiation therapy: Secondary | ICD-10-CM | POA: Diagnosis not present

## 2013-09-14 ENCOUNTER — Other Ambulatory Visit: Payer: Self-pay | Admitting: Radiology

## 2013-09-14 ENCOUNTER — Encounter (HOSPITAL_COMMUNITY): Payer: Self-pay | Admitting: Pharmacy Technician

## 2013-09-15 ENCOUNTER — Other Ambulatory Visit: Payer: Self-pay | Admitting: Hematology and Oncology

## 2013-09-15 ENCOUNTER — Ambulatory Visit (HOSPITAL_COMMUNITY)
Admission: RE | Admit: 2013-09-15 | Discharge: 2013-09-15 | Disposition: A | Discharge status: Home / Self Care | Payer: Medicare Other | Source: Ambulatory Visit | Attending: Hematology and Oncology | Admitting: Hematology and Oncology

## 2013-09-15 ENCOUNTER — Other Ambulatory Visit: Payer: Self-pay | Admitting: *Deleted

## 2013-09-15 ENCOUNTER — Encounter (HOSPITAL_COMMUNITY): Payer: Self-pay

## 2013-09-15 DIAGNOSIS — E785 Hyperlipidemia, unspecified: Secondary | ICD-10-CM | POA: Diagnosis not present

## 2013-09-15 DIAGNOSIS — Z79899 Other long term (current) drug therapy: Secondary | ICD-10-CM | POA: Diagnosis not present

## 2013-09-15 DIAGNOSIS — Z7982 Long term (current) use of aspirin: Secondary | ICD-10-CM | POA: Diagnosis not present

## 2013-09-15 DIAGNOSIS — I10 Essential (primary) hypertension: Secondary | ICD-10-CM | POA: Insufficient documentation

## 2013-09-15 DIAGNOSIS — J449 Chronic obstructive pulmonary disease, unspecified: Secondary | ICD-10-CM | POA: Insufficient documentation

## 2013-09-15 DIAGNOSIS — C321 Malignant neoplasm of supraglottis: Secondary | ICD-10-CM | POA: Diagnosis not present

## 2013-09-15 DIAGNOSIS — J4489 Other specified chronic obstructive pulmonary disease: Secondary | ICD-10-CM | POA: Insufficient documentation

## 2013-09-15 DIAGNOSIS — Z452 Encounter for adjustment and management of vascular access device: Secondary | ICD-10-CM | POA: Insufficient documentation

## 2013-09-15 DIAGNOSIS — F17200 Nicotine dependence, unspecified, uncomplicated: Secondary | ICD-10-CM | POA: Insufficient documentation

## 2013-09-15 DIAGNOSIS — R1319 Other dysphagia: Secondary | ICD-10-CM | POA: Insufficient documentation

## 2013-09-15 LAB — CBC WITH DIFFERENTIAL/PLATELET
Basophils Absolute: 0 K/uL (ref 0.0–0.1)
Basophils Relative: 0 % (ref 0–1)
Eosinophils Absolute: 0.2 K/uL (ref 0.0–0.7)
Eosinophils Relative: 2 % (ref 0–5)
HCT: 38.2 % — ABNORMAL LOW (ref 39.0–52.0)
Hemoglobin: 13 g/dL (ref 13.0–17.0)
Lymphocytes Relative: 20 % (ref 12–46)
Lymphs Abs: 1.8 K/uL (ref 0.7–4.0)
MCH: 29.7 pg (ref 26.0–34.0)
MCHC: 34 g/dL (ref 30.0–36.0)
MCV: 87.4 fL (ref 78.0–100.0)
Monocytes Absolute: 0.8 K/uL (ref 0.1–1.0)
Monocytes Relative: 9 % (ref 3–12)
Neutro Abs: 6.3 K/uL (ref 1.7–7.7)
Neutrophils Relative %: 69 % (ref 43–77)
Platelets: 232 K/uL (ref 150–400)
RBC: 4.37 MIL/uL (ref 4.22–5.81)
RDW: 13.8 % (ref 11.5–15.5)
WBC: 9.1 K/uL (ref 4.0–10.5)

## 2013-09-15 LAB — PROTIME-INR
INR: 0.98 (ref 0.00–1.49)
Prothrombin Time: 13 s (ref 11.6–15.2)

## 2013-09-15 LAB — BASIC METABOLIC PANEL WITH GFR
Anion gap: 10 (ref 5–15)
BUN: 9 mg/dL (ref 6–23)
CO2: 28 meq/L (ref 19–32)
Calcium: 10 mg/dL (ref 8.4–10.5)
Chloride: 99 meq/L (ref 96–112)
Creatinine, Ser: 0.62 mg/dL (ref 0.50–1.35)
GFR calc Af Amer: >90 mL/min
GFR calc non Af Amer: >90 mL/min
Glucose, Bld: 112 mg/dL — ABNORMAL HIGH (ref 70–99)
Potassium: 3.9 meq/L (ref 3.7–5.3)
Sodium: 137 meq/L (ref 137–147)

## 2013-09-15 LAB — APTT: aPTT: 28 seconds (ref 24–37)

## 2013-09-15 MED ORDER — HYDROMORPHONE HCL PF 1 MG/ML IJ SOLN: 1.0000 mg | INTRAMUSCULAR | Status: DC | PRN

## 2013-09-15 MED ORDER — HYDROCODONE-ACETAMINOPHEN 5-325 MG PO TABS: 1.0000 | ORAL_TABLET | ORAL | Status: DC | PRN

## 2013-09-15 MED ORDER — GLUCAGON HCL (RDNA) 1 MG IJ SOLR
INTRAMUSCULAR | Status: AC | PRN
  Administered 2013-09-15: 1 mg via INTRAVENOUS

## 2013-09-15 MED ORDER — GLUCAGON HCL RDNA (DIAGNOSTIC) 1 MG IJ SOLR
INTRAMUSCULAR | Status: AC
  Filled 2013-09-15: qty 1

## 2013-09-15 MED ORDER — MIDAZOLAM HCL 2 MG/2ML IJ SOLN
INTRAMUSCULAR | Status: AC | PRN
  Administered 2013-09-15 (×3): 1 mg via INTRAVENOUS

## 2013-09-15 MED ORDER — HEPARIN SOD (PORK) LOCK FLUSH 100 UNIT/ML IV SOLN
500.0000 [IU] | Freq: Once | INTRAVENOUS | Status: AC
  Administered 2013-09-15: 500 [IU] via INTRAVENOUS

## 2013-09-15 MED ORDER — SODIUM CHLORIDE 0.9 % IV SOLN
INTRAVENOUS | Status: DC
  Administered 2013-09-15: 10:00:00 via INTRAVENOUS

## 2013-09-15 MED ORDER — CEFAZOLIN SODIUM-DEXTROSE 2-3 GM-% IV SOLR
INTRAVENOUS | Status: AC
  Filled 2013-09-15: qty 50

## 2013-09-15 MED ORDER — CEFAZOLIN SODIUM-DEXTROSE 2-3 GM-% IV SOLR
2.0000 g | INTRAVENOUS | Status: AC
  Administered 2013-09-15: 2 g via INTRAVENOUS

## 2013-09-15 MED ORDER — FENTANYL CITRATE 0.05 MG/ML IJ SOLN
INTRAMUSCULAR | Status: AC | PRN
  Administered 2013-09-15 (×3): 50 ug via INTRAVENOUS

## 2013-09-15 MED ORDER — MIDAZOLAM HCL 2 MG/2ML IJ SOLN
INTRAMUSCULAR | Status: AC
  Filled 2013-09-15: qty 4

## 2013-09-15 MED ORDER — HEPARIN SOD (PORK) LOCK FLUSH 100 UNIT/ML IV SOLN
INTRAVENOUS | Status: AC
  Filled 2013-09-15: qty 5

## 2013-09-15 MED ORDER — IOHEXOL 300 MG/ML  SOLN
20.0000 mL | Freq: Once | INTRAMUSCULAR | Status: AC | PRN
  Administered 2013-09-15: 1 mL

## 2013-09-15 MED ORDER — LIDOCAINE HCL 1 % IJ SOLN
INTRAMUSCULAR | Status: AC
  Filled 2013-09-15: qty 20

## 2013-09-15 MED ORDER — ONDANSETRON HCL 4 MG/2ML IJ SOLN: 4.0000 mg | INTRAMUSCULAR | Status: DC | PRN

## 2013-09-15 MED ORDER — LIDOCAINE-EPINEPHRINE (PF) 2 %-1:200000 IJ SOLN
INTRAMUSCULAR | Status: AC
  Filled 2013-09-15: qty 20

## 2013-09-15 MED ORDER — FENTANYL CITRATE 0.05 MG/ML IJ SOLN
INTRAMUSCULAR | Status: AC
  Filled 2013-09-15: qty 4

## 2013-09-15 NOTE — Procedures (Signed)
Interventional Radiology Procedure Note  Procedure:  1.) Placement of a right IJ approach single lumen PowerPort.  Tip is positioned at the superior cavoatrial junction and catheter is ready for immediate use.  2.) Placement of 59F pull-through gastrostomy tube Complications: No immediate Recommendations:  - NPO except sips and chips until tomorrow - Will recover for 4 hrs with g-tube to LWS  Signed,  Criselda Peaches, MD Vascular & Interventional Radiologist [facility] Radiology

## 2013-09-15 NOTE — Progress Notes (Signed)
Informed Lurlean Leyden RN home care coordinator w/ [facility] of referral for PEG tube teaching.

## 2013-09-15 NOTE — Discharge Instructions (Signed)
Gastrostomy Tube Home Guide A gastrostomy tube is a tube that is surgically placed into the stomach. It is also called a "G-tube." G-tubes are used when a person is unable to eat and drink enough on their own to stay healthy. The tube is inserted into the stomach through a small cut (incision) in the skin. This tube is used for:  Feeding.  Giving medication. GASTROSTOMY TUBE CARE  Wash your hands with soap and water.  Remove the old dressing (if any). Some styles of G-tubes may need a dressing inserted between the skin and the G-tube. Other types of G-tubes do not require a dressing. Ask your health care provider if a dressing is needed.  Check the area where the tube enters the skin (insertion site) for redness, swelling, or pus-like (purulent) drainage. A small amount of clear or tan liquid drainage is normal. Check to make sure scar tissue (skin) is not growing around the insertion site. This could have a raised, bumpy appearance.  A cotton swab can be used to clean the skin around the tube:  When the G-tube is first put in, a normal saline solution or water can be used to clean the skin.  Mild soap and warm water can be used when the skin around the G-tube site has healed.  Roll the cotton swab around the G-tube insertion site to remove any drainage or crusting at the insertion site. STOMACH RESIDUALS Feeding tube residuals are the amount of liquids that are in the stomach at any given time. Residuals may be checked before giving feedings, medications, or as instructed by your health care provider.  Ask your health care provider if there are instances when you would not start tube feedings depending on the amount or type of contents withdrawn from the stomach.  Check residuals by attaching a syringe to the G-tube and pulling back on the syringe plunger. Note the amount, and return the residual back into the stomach. FLUSHING THE G-TUBE  The G-tube should be periodically flushed with  clean warm water to keep it from clogging.  Flush the G-tube after feedings or medications. Draw up 30 mL of warm water in a syringe. Connect the syringe to the G-tube and slowly push the water into the tube.  Do not push feedings, medications, or flushes rapidly. Flush the G-tube gently and slowly.  Only use syringes made for G-tubes to flush medications or feedings.  Your health care provider may want the G-tube flushed more often or with more water. If this is the case, follow your health care provider's instructions. FEEDINGS Your health care provider will determine whether feedings are given as a bolus (a certain amount given at one time and at scheduled times) or whether feedings will be given continuously on a feeding pump.   Formulas should be given at room temperature.  If feedings are continuous, no more than 4 hours worth of feedings should be placed in the feeding bag. This helps prevent spoilage or accidental excess infusion.  Cover and place unused formula in the refrigerator.  If feedings are continuous, stop the feedings when medications or flushes are given. Be sure to restart the feedings.  Feeding bags and syringes should be replaced as instructed by your health care provider. GIVING MEDICATION   In general, it is best if all medications are in a liquid form for G-tube administration. Liquid medications are less likely to clog the G-tube.  Mix the liquid medication with 30 mL (or amount recommended by your  health care provider) of warm water.  Draw up the medication into the syringe.  Attach the syringe to the G-tube and slowly push the mixture into the G-tube.  After giving the medication, draw up 30 mL of warm water in the syringe and slowly flush the G-tube.  For pills or capsules, check with your health care provider first before crushing medications. Some pills are not effective if they are crushed. Some capsules are sustained-release medications.  If  appropriate, crush the pill or capsule and mix with 30 mL of warm water. Using the syringe, slowly push the medication through the tube, then flush the tube with another 30 mL of tap water. G-TUBE PROBLEMS G-tube was pulled out.  Cause: May have been pulled out accidentally.  Solutions: Cover the opening with clean dressing and tape. Call your health care provider right away. The G-tube should be put in as soon as possible (within 4 hours) so the G-tube opening (tract) does not close. The G-tube needs to be put in at a health care setting. An X-ray needs to be done to confirm placement before the G-tube can be used again. Redness, irritation, soreness, or foul odor around the gastrostomy site.  Cause: May be caused by leakage or infection.  Solutions: Call your health care provider right away. Large amount of leakage of fluid or mucus-like liquid present (a large amount means it soaks clothing).  Cause: Many reasons could cause the G-tube to leak.  Solutions: Call your health care provider to discuss the amount of leakage. Skin or scar tissue appears to be growing where tube enters skin.   Cause: Tissue growth may develop around the insertion site if the G-tube is moved or pulled on excessively.  Solutions: Secure tube with tape so that excess movement does not occur. Call your health care provider. G-tube is clogged.  Cause: Thick formula or medication.  Solutions: Try to slowly push warm water into the tube with a large syringe. Never try to push any object into the tube to unclog it. Do not force fluid into the G-tube. If you are unable to unclog the tube, call your health care provider right away. TIPS  Head of bed (HOB) position refers to the upright position of a person's upper body.  When giving medications or a feeding bolus, keep the [facility] up as told by your health care provider. Do this during the feeding and for 1 hour after the feeding or medication administration.  If  continuous feedings are being given, it is best to keep the [facility] up as told by your health care provider. When ADLs (activities of daily living) are performed and the [facility] needs to be flat, be sure to turn the feeding pump off. Restart the feeding pump when the [facility] is returned to the recommended height.  Do not pull or put tension on the tube.  To prevent fluid backflow, kink the G-tube before removing the cap or disconnecting a syringe.  Check the G-tube length every day. Measure from the insertion site to the end of the G-tube. If the length is longer than previous measurements, the tube may be coming out. Call your health care provider if you notice increasing G-tube length.  Oral care, such as brushing teeth, must be continued.  You may need to remove excess air (vent) from the G-tube. Your health care provider will tell you if this is needed.  Always call your health care provider if you have questions or problems with the G-tube.  SEEK IMMEDIATE MEDICAL CARE IF:   You have severe abdominal pain, tenderness, or abdominal bloating (distension).  You have nausea or vomiting.  You are constipated or have problems moving your bowels.  The G-tube insertion site is red, swollen, has a foul smell, or has yellow or brown drainage.  You have difficulty breathing or shortness of breath.  You have a fever.  You have a large amount of feeding tube residuals.  The G-tube is clogged and cannot be flushed. MAKE SURE YOU:   Understand these instructions.  Will watch your condition.  Will get help right away if you are not doing well or get worse. Document Released: 03/26/2001 Document Revised: 06/01/2013 Document Reviewed: 09/22/2012 [facility] Patient Information 2015 [place], [place]. This information is not intended to replace advice given to you by your health care provider. Make sure you discuss any questions you have with your health care provider. Implanted [facility] Guide An implanted  port is a type of central line that is placed under the skin. Central lines are used to provide IV access when treatment or nutrition needs to be given through a person's veins. Implanted ports are used for long-term IV access. An implanted port may be placed because:   You need IV medicine that would be irritating to the small veins in your hands or arms.   You need long-term IV medicines, such as antibiotics.   You need IV nutrition for a long period.   You need frequent blood draws for lab tests.   You need dialysis.  Implanted ports are usually placed in the chest area, but they can also be placed in the upper arm, the abdomen, or the leg. An implanted port has two main parts:   Reservoir. The reservoir is round and will appear as a small, raised area under your skin. The reservoir is the part where a needle is inserted to give medicines or draw blood.   Catheter. The catheter is a thin, flexible tube that extends from the reservoir. The catheter is placed into a large vein. Medicine that is inserted into the reservoir goes into the catheter and then into the vein.  HOW WILL I CARE FOR MY INCISION SITE? Do not get the incision site wet. Bathe or shower as directed by your health care provider.  HOW IS MY PORT ACCESSED? Special steps must be taken to access the port:   Before the port is accessed, a numbing cream can be placed on the skin. This helps numb the skin over the port site.   Your health care provider uses a sterile technique to access the port.  Your health care provider must put on a mask and sterile gloves.  The skin over your port is cleaned carefully with an antiseptic and allowed to dry.  The port is gently pinched between sterile gloves, and a needle is inserted into the port.  Only "non-coring" port needles should be used to access the port. Once the port is accessed, a blood return should be checked. This helps ensure that the port is in the vein and is  not clogged.   If your port needs to remain accessed for a constant infusion, a clear (transparent) bandage will be placed over the needle site. The bandage and needle will need to be changed every week, or as directed by your health care provider.   Keep the bandage covering the needle clean and dry. Do not get it wet. Follow your health care provider's instructions on  how to take a shower or bath while the port is accessed.   If your port does not need to stay accessed, no bandage is needed over the port.  WHAT IS FLUSHING? Flushing helps keep the port from getting clogged. Follow your health care provider's instructions on how and when to flush the port. Ports are usually flushed with saline solution or a medicine called heparin. The need for flushing will depend on how the port is used.   If the port is used for intermittent medicines or blood draws, the port will need to be flushed:   After medicines have been given.   After blood has been drawn.   As part of routine maintenance.   If a constant infusion is running, the port may not need to be flushed.  HOW LONG WILL MY PORT STAY IMPLANTED? The port can stay in for as long as your health care provider thinks it is needed. When it is time for the port to come out, surgery will be done to remove it. The procedure is similar to the one performed when the port was put in.  WHEN SHOULD I SEEK IMMEDIATE MEDICAL CARE? When you have an implanted port, you should seek immediate medical care if:   You notice a bad smell coming from the incision site.   You have swelling, redness, or drainage at the incision site.   You have more swelling or pain at the port site or the surrounding area.   You have a fever that is not controlled with medicine. Document Released: 01/15/2005 Document Revised: 11/05/2012 Document Reviewed: 09/22/2012 [facility] Patient Information 2015 [place], [place]. This information is not intended to replace  advice given to you by your health care provider. Make sure you discuss any questions you have with your health care provider.   Conscious Sedation, Adult, Care After Refer to this sheet in the next few weeks. These instructions provide you with information on caring for yourself after your procedure. Your health care provider may also give you more specific instructions. Your treatment has been planned according to current medical practices, but problems sometimes occur. Call your health care provider if you have any problems or questions after your procedure. WHAT TO EXPECT AFTER THE PROCEDURE  After your procedure:  You may feel sleepy, clumsy, and have poor balance for several hours.  Vomiting may occur if you eat too soon after the procedure. HOME CARE INSTRUCTIONS  Do not participate in any activities where you could become injured for at least 24 hours. Do not:  Drive.  Swim.  Ride a bicycle.  Operate heavy machinery.  Cook.  Use power tools.  Climb ladders.  Work from a high place.  Do not make important decisions or sign legal documents until you are improved.  If you vomit, drink water, juice, or soup when you can drink without vomiting. Make sure you have little or no nausea before eating solid foods.  Only take over-the-counter or prescription medicines for pain, discomfort, or fever as directed by your health care provider.  Make sure you and your family fully understand everything about the medicines given to you, including what side effects may occur.  You should not drink alcohol, take sleeping pills, or take medicines that cause drowsiness for at least 24 hours.  If you smoke, do not smoke without supervision.  If you are feeling better, you may resume normal activities 24 hours after you were sedated.  Keep all appointments with your health care  provider. SEEK MEDICAL CARE IF:  Your skin is pale or bluish in color.  You continue to feel nauseous or  vomit.  Your pain is getting worse and is not helped by medicine.  You have bleeding or swelling.  You are still sleepy or feeling clumsy after 24 hours. SEEK IMMEDIATE MEDICAL CARE IF:  You develop a rash.  You have difficulty breathing.  You develop any type of allergic problem.  You have a fever. MAKE SURE YOU:  Understand these instructions.  Will watch your condition.  Will get help right away if you are not doing well or get worse. Document Released: 11/05/2012 Document Reviewed: 11/05/2012 [facility] Patient Information 2015 [place], [place]. This information is not intended to replace advice given to you by your health care provider. Make sure you discuss any questions you have with your health care provider. Implanted Port Insertion, Care After Refer to this sheet in the next few weeks. These instructions provide you with information on caring for yourself after your procedure. Your health care provider may also give you more specific instructions. Your treatment has been planned according to current medical practices, but problems sometimes occur. Call your health care provider if you have any problems or questions after your procedure. WHAT TO EXPECT AFTER THE PROCEDURE After your procedure, it is typical to have the following:   Discomfort at the port insertion site. Ice packs to the area will help.  Bruising on the skin over the port. This will subside in 3-4 days. HOME CARE INSTRUCTIONS  After your port is placed, you will get a manufacturer's information card. The card has information about your port. Keep this card with you at all times.   Know what kind of port you have. There are many types of ports available.   Wear a medical alert bracelet in case of an emergency. This can help alert health care workers that you have a port.   The port can stay in for as long as your health care provider believes it is necessary.   A home health care nurse may give  medicines and take care of the port.   You or a family member can get special training and directions for giving medicine and taking care of the port at home.  SEEK MEDICAL CARE IF:   Your port does not flush or you are unable to get a blood return.   You have a fever or chills. SEEK IMMEDIATE MEDICAL CARE IF:  You have new fluid or pus coming from your incision.   You notice a bad smell coming from your incision site.   You have swelling, pain, or more redness at the incision or port site.   You have chest pain or shortness of breath. Document Released: 11/05/2012 Document Revised: 01/20/2013 Document Reviewed: 11/05/2012 [facility] Patient Information 2015 [place], [place]. This information is not intended to replace advice given to you by your health care provider. Make sure you discuss any questions you have with your health care provider.

## 2013-09-15 NOTE — H&P (Signed)
Chief Complaint: "I am here for a stomach tube and a port." Referring Physician: Dr. Alvy Bimler HPI: Andrew Osborn is an 58 y.o. 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 He is currently eating without difficulty. He is scheduled today for an image guided percutaneous gastrostomy tube placement for nutrition purposes during treatment. He is also scheduled today for port a catheter placement with sedation. He states his only abdominal surgery was a hernia repair when he was a child. He states he was able to drink his pre-op barium last evening. He denies any chest pain, shortness of breath or palpitations. He denies any active signs of bleeding or excessive bruising. He denies any recent fever or chills. The patient denies any history of sleep apnea or chronic oxygen use. He has previously tolerated sedation without complications.   Past Medical History:  Past Medical History  Diagnosis Date  . Obesity   . Swallowing difficulty   . Schizophrenia   . 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 Hypertension   . Hyperlipidemia   . COPD (chronic obstructive pulmonary disease)   . Tobacco abuse 08/25/2013    Past Surgical History:  Past Surgical History  Procedure Laterality Date  . Direct laryngoscopy N/A 08/13/2013    Procedure: DIRECT LARYNGOSCOPY;  Surgeon: Ruby Cola, MD;  Location: [facility];  Service: ENT;  Laterality: N/A;  Direct Laryngoscopy with biopsy.  . Inguinal hernia repair      as 58 year old    Family History:  Family History  Problem Relation Age of Onset  . Cancer Mother     lung cancer  . Cancer Father     lung cancer    Social History:  reports that he has been smoking Cigarettes.  He has a 10 pack-year smoking history. He has never used smokeless tobacco. He reports that he does not drink alcohol or use illicit drugs.  Allergies: No Known Allergies  Medications:   Medication List    ASK your doctor about these medications        acetaminophen 325 MG tablet  Commonly known as:  TYLENOL  Take 650 mg by mouth every 6 (six) hours as needed (pain).     aspirin EC 81 MG tablet  Take 81 mg by mouth daily.     cholecalciferol 400 UNITS Tabs tablet  Commonly known as:  VITAMIN D  Take 800 Units by mouth daily.     cycloSPORINE 0.05 % ophthalmic emulsion  Commonly known as:  RESTASIS  Place 1 drop into both eyes 2 (two) times daily.     guaifenesin 100 MG/5ML syrup  Commonly known as:  ROBITUSSIN  Take 200 mg by mouth 3 (three) times daily as needed for cough.     hydrocerin Crea  Apply 1 application topically 2 (two) times daily. Spread to bilateral palm twice daily     HYDROCODONE-ACETAMINOPHEN PO  Take 15 mLs by mouth every 4 (four) hours as needed (Hydorcodone 7.5/325mg/5ml oral solution.Pain).     ibuprofen 400 MG tablet  Commonly known as:  ADVIL,MOTRIN  Take 400 mg by mouth every 6 (six) hours as needed for headache or mild pain.     lidocaine-prilocaine cream  Commonly known as:  EMLA  Apply 1 application topically as needed.     lisinopril 10 MG tablet  Commonly known as:  PRINIVIL,ZESTRIL  Take 10 mg by mouth every morning.     ondansetron 4 MG tablet  Commonly known as:  ZOFRAN  Take 4 mg by mouth 3 (three) times daily as needed for nausea or vomiting.     OVER THE COUNTER MEDICATION  Take 237 mLs by mouth 3 (three) times daily. Great Shakes     promethazine 25 MG tablet  Commonly known as:  PHENERGAN  Take 1 tablet (25 mg total) by mouth every 6 (six) hours as needed for nausea.     risperiDONE 2 MG tablet  Commonly known as:  RISPERDAL  Take 2 mg by mouth 2 (two) times daily.     simvastatin 10 MG tablet  Commonly known as:  ZOCOR  Take 10 mg by mouth at bedtime.       Please HPI for pertinent positives, otherwise complete 10 system ROS negative.  Physical Exam: BP 120/85  Pulse 81  Temp(Src) 98.5 F (36.9 C) (Oral)  Resp 18  Ht 5' 10.5" (1.791 m)  Wt 153 lb (69.4 kg)   BMI 21.64 kg/m2  SpO2 93% Body mass index is 21.64 kg/(m^2).  General Appearance:  Alert, cooperative, no distress  Head:  Normocephalic, without obvious abnormality, atraumatic  Neck: Supple, symmetrical, trachea midline  Lungs:   Clear to auscultation bilaterally, no w/r/r, respirations unlabored without use of accessory muscles.  Chest Wall:  No tenderness or deformity  Heart:  Regular rate and rhythm, S1, S2 normal, no murmur, rub or gallop.  Abdomen:   Soft, non-tender, non distended, (+) BS  Extremities: Extremities normal, atraumatic, no cyanosis or edema  Neurologic: Normal affect, no gross deficits.   Results for orders placed during the hospital encounter of 09/15/13 (from the past 48 hour(s))  APTT     Status: None   Collection Time    09/15/13  9:40 AM      Result Value Ref Range   aPTT 28  24 - 37 seconds  BASIC METABOLIC PANEL     Status: Abnormal   Collection Time    09/15/13  9:40 AM      Result Value Ref Range   Sodium 137  137 - 147 mEq/L   Potassium 3.9  3.7 - 5.3 mEq/L   Chloride 99  96 - 112 mEq/L   CO2 28  19 - 32 mEq/L   Glucose, Bld 112 (*) 70 - 99 mg/dL   BUN 9  6 - 23 mg/dL   Creatinine, Ser 0.62  0.50 - 1.35 mg/dL   Calcium 10.0  8.4 - 10.5 mg/dL   GFR calc non Af Amer >90  >90 mL/min   GFR calc Af Amer >90  >90 mL/min   Comment: (NOTE)     The eGFR has been calculated using the CKD EPI equation.     This calculation has not been validated in all clinical situations.     eGFR's persistently <90 mL/min signify possible Chronic Kidney     Disease.   Anion gap 10  5 - 15  CBC WITH DIFFERENTIAL     Status: Abnormal   Collection Time    09/15/13  9:40 AM      Result Value Ref Range   WBC 9.1  4.0 - 10.5 K/uL   RBC 4.37  4.22 - 5.81 MIL/uL   Hemoglobin 13.0  13.0 - 17.0 g/dL   HCT 38.2 (*) 39.0 - 52.0 %   MCV 87.4  78.0 - 100.0 fL   MCH 29.7  26.0 - 34.0 pg   MCHC 34.0  30.0 - 36.0 g/dL   RDW 13.8  11.5 - 15.5 %  Platelets 232  150 - 400 K/uL    Neutrophils Relative % 69  43 - 77 %   Neutro Abs 6.3  1.7 - 7.7 K/uL   Lymphocytes Relative 20  12 - 46 %   Lymphs Abs 1.8  0.7 - 4.0 K/uL   Monocytes Relative 9  3 - 12 %   Monocytes Absolute 0.8  0.1 - 1.0 K/uL   Eosinophils Relative 2  0 - 5 %   Eosinophils Absolute 0.2  0.0 - 0.7 K/uL   Basophils Relative 0  0 - 1 %   Basophils Absolute 0.0  0.0 - 0.1 K/uL  PROTIME-INR     Status: None   Collection Time    09/15/13  9:40 AM      Result Value Ref Range   Prothrombin Time 13.0  11.6 - 15.2 seconds   INR 0.98  0.00 - 1.49   No results found.  Assessment/Plan Malignant neoplasm of supraglottis  Scheduled today for percutaneous gastrostomy tube placement, drank barium last evening Scheduled today for port a catheter placement with moderate sedation. Patient has been NPO, no blood thinners taken, afebrile, labs reviewed, ancef ordered.  Risks and Benefits discussed with the patient. All of the patient's questions were answered, patient is agreeable to proceed. Consent signed and in chart.   Tsosie Billing D PA-C 09/15/2013, 11:29 AM

## 2013-09-16 ENCOUNTER — Other Ambulatory Visit: Payer: Self-pay | Admitting: Hematology and Oncology

## 2013-09-16 ENCOUNTER — Encounter: Payer: Self-pay | Admitting: *Deleted

## 2013-09-16 ENCOUNTER — Other Ambulatory Visit: Payer: Medicare Other

## 2013-09-16 ENCOUNTER — Ambulatory Visit (HOSPITAL_BASED_OUTPATIENT_CLINIC_OR_DEPARTMENT_OTHER): Payer: Medicare Other

## 2013-09-16 ENCOUNTER — Encounter: Payer: Self-pay | Admitting: Radiation Oncology

## 2013-09-16 ENCOUNTER — Ambulatory Visit: Payer: Medicare Other | Admitting: Nutrition

## 2013-09-16 ENCOUNTER — Ambulatory Visit
Admission: RE | Admit: 2013-09-16 | Discharge: 2013-09-16 | Disposition: A | Discharge status: Home / Self Care | Payer: Medicare Other | Source: Ambulatory Visit | Attending: Radiation Oncology | Admitting: Radiation Oncology

## 2013-09-16 VITALS — BP 133/58 | HR 91 | Temp 98.1°F

## 2013-09-16 DIAGNOSIS — Z5111 Encounter for antineoplastic chemotherapy: Secondary | ICD-10-CM

## 2013-09-16 DIAGNOSIS — C321 Malignant neoplasm of supraglottis: Secondary | ICD-10-CM

## 2013-09-16 DIAGNOSIS — Z510 Encounter for antineoplastic radiation therapy: Secondary | ICD-10-CM | POA: Diagnosis not present

## 2013-09-16 MED ORDER — DEXAMETHASONE SODIUM PHOSPHATE 20 MG/5ML IJ SOLN
12.0000 mg | Freq: Once | INTRAMUSCULAR | Status: AC
  Administered 2013-09-16: 12 mg via INTRAVENOUS

## 2013-09-16 MED ORDER — PALONOSETRON HCL INJECTION 0.25 MG/5ML
INTRAVENOUS | Status: AC
  Filled 2013-09-16: qty 5

## 2013-09-16 MED ORDER — PALONOSETRON HCL INJECTION 0.25 MG/5ML
0.2500 mg | Freq: Once | INTRAVENOUS | Status: AC
  Administered 2013-09-16: 0.25 mg via INTRAVENOUS

## 2013-09-16 MED ORDER — MANNITOL 25 % IV SOLN
Freq: Once | INTRAVENOUS | Status: AC
  Administered 2013-09-16: 10:00:00 via INTRAVENOUS
  Filled 2013-09-16: qty 10

## 2013-09-16 MED ORDER — DEXAMETHASONE SODIUM PHOSPHATE 20 MG/5ML IJ SOLN
INTRAMUSCULAR | Status: AC
Start: 2013-09-16 — End: 2013-09-16
  Filled 2013-09-16: qty 5

## 2013-09-16 MED ORDER — SODIUM CHLORIDE 0.9 % IV SOLN
150.0000 mg | Freq: Once | INTRAVENOUS | Status: AC
  Administered 2013-09-16: 150 mg via INTRAVENOUS
  Filled 2013-09-16: qty 5

## 2013-09-16 MED ORDER — SODIUM CHLORIDE 0.9 % IV SOLN
100.0000 mg/m2 | Freq: Once | INTRAVENOUS | Status: AC
  Administered 2013-09-16: 180 mg via INTRAVENOUS
  Filled 2013-09-16: qty 180

## 2013-09-16 MED ORDER — HEPARIN SOD (PORK) LOCK FLUSH 100 UNIT/ML IV SOLN
500.0000 [IU] | Freq: Once | INTRAVENOUS | Status: AC | PRN
  Administered 2013-09-16: 500 [IU]
  Filled 2013-09-16: qty 5

## 2013-09-16 MED ORDER — SODIUM CHLORIDE 0.9 % IJ SOLN
10.0000 mL | INTRAMUSCULAR | Status: DC | PRN
  Administered 2013-09-16: 10 mL
  Filled 2013-09-16: qty 10

## 2013-09-16 NOTE — Progress Notes (Signed)
Chart note: The patient underwent Tomotherapy segmentation today for treatment to his supraglottic larynx/neck. He is being treated to 9.7 delivered field widths corresponding to one set of IMRT treatment devices [phone])

## 2013-09-16 NOTE — Patient Instructions (Signed)
[facility] Discharge Instructions for Patients Receiving Chemotherapy  Today you received the following chemotherapy agents: Cisplatin  To help prevent nausea and vomiting after your treatment, we encourage you to take your nausea medication: Zofran 4mg  3 x a day as needed.   If you develop nausea and vomiting that is not controlled by your nausea medication, call the clinic.   BELOW ARE SYMPTOMS THAT SHOULD BE REPORTED IMMEDIATELY:  *FEVER GREATER THAN 100.5 F  *CHILLS WITH OR WITHOUT FEVER  NAUSEA AND VOMITING THAT IS NOT CONTROLLED WITH YOUR NAUSEA MEDICATION  *UNUSUAL SHORTNESS OF BREATH  *UNUSUAL BRUISING OR BLEEDING  TENDERNESS IN MOUTH AND THROAT WITH OR WITHOUT PRESENCE OF ULCERS  *URINARY PROBLEMS  *BOWEL PROBLEMS  UNUSUAL RASH Items with * indicate a potential emergency and should be followed up as soon as possible.  Feel free to call the clinic you have any questions or concerns. The clinic phone number is [phone].

## 2013-09-16 NOTE — Progress Notes (Signed)
Nutrition followup completed with patient during chemotherapy.  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  Patient is status post PEG on August 18.  Weight documented as 153 pounds on August 18, which is stable over the past month.  Patient states he is eating well.  I don't believe patient has begun oral nutrition supplements at this time. He lives in an [facility] home.  Nutrition diagnosis: Predicted suboptimal energy intake continues.  Intervention: Educated patient to consume meals with snacks 3 times a day. Educated patient on high-protein foods. Patient to request oral nutrition supplements/snacks 3 times a day between meals. Encouraged patient to provide my contact information for staff if they have questions. Questions answered.  Teach back method used.  Monitoring, evaluation, goals: Patient is tolerating oral intake and has had weight maintenance.  Will begin tube feedings once patient's oral intake decreases 5% of present weight.  Next visit: Will followup with patient next week after radiation treatment.  **Disclaimer: This note was dictated with voice recognition software. Similar sounding words can inadvertently be transcribed and this note may contain transcription errors which may not have been corrected upon publication of note.**

## 2013-09-17 ENCOUNTER — Encounter: Payer: Self-pay | Admitting: Hematology and Oncology

## 2013-09-17 ENCOUNTER — Ambulatory Visit
Admission: RE | Admit: 2013-09-17 | Discharge: 2013-09-17 | Disposition: A | Discharge status: Home / Self Care | Payer: Medicare Other | Source: Ambulatory Visit | Attending: Radiation Oncology | Admitting: Radiation Oncology

## 2013-09-17 ENCOUNTER — Telehealth: Payer: Self-pay | Admitting: *Deleted

## 2013-09-17 ENCOUNTER — Ambulatory Visit (HOSPITAL_BASED_OUTPATIENT_CLINIC_OR_DEPARTMENT_OTHER): Payer: Medicare Other | Admitting: Hematology and Oncology

## 2013-09-17 VITALS — BP 110/62 | HR 86 | Temp 98.3°F | Resp 18 | Ht 70.5 in | Wt 158.7 lb

## 2013-09-17 DIAGNOSIS — J988 Other specified respiratory disorders: Secondary | ICD-10-CM

## 2013-09-17 DIAGNOSIS — Z510 Encounter for antineoplastic radiation therapy: Secondary | ICD-10-CM | POA: Diagnosis not present

## 2013-09-17 DIAGNOSIS — K5900 Constipation, unspecified: Secondary | ICD-10-CM

## 2013-09-17 DIAGNOSIS — Z931 Gastrostomy status: Secondary | ICD-10-CM

## 2013-09-17 DIAGNOSIS — K5901 Slow transit constipation: Secondary | ICD-10-CM

## 2013-09-17 DIAGNOSIS — F209 Schizophrenia, unspecified: Secondary | ICD-10-CM

## 2013-09-17 DIAGNOSIS — Z720 Tobacco use: Secondary | ICD-10-CM

## 2013-09-17 DIAGNOSIS — C321 Malignant neoplasm of supraglottis: Secondary | ICD-10-CM

## 2013-09-17 DIAGNOSIS — R070 Pain in throat: Secondary | ICD-10-CM

## 2013-09-17 HISTORY — DX: Pain in throat: R07.0

## 2013-09-17 HISTORY — DX: Constipation, unspecified: K59.00

## 2013-09-17 MED ORDER — POLYETHYLENE GLYCOL 3350 17 G PO PACK: 17.0000 g | PACK | Freq: Every day | ORAL | Status: DC

## 2013-09-17 MED ORDER — HYDROMORPHONE HCL 2 MG PO TABS: 1.0000 mg | ORAL_TABLET | ORAL | Status: DC | PRN

## 2013-09-17 NOTE — Assessment & Plan Note (Signed)
So far, clinically, he has no symptoms of stridor or imminent airway obstruction.

## 2013-09-17 NOTE — Assessment & Plan Note (Signed)
His feeding tube site looks okay without signs of infection.

## 2013-09-17 NOTE — Assessment & Plan Note (Signed)
I recommend increase oral fluid intake. I gave him prescription laxatives to take daily.

## 2013-09-17 NOTE — Assessment & Plan Note (Signed)
He was started on treatment yesterday. So far he tolerated treatment well. Continue weekly toxicity review and supportive care.

## 2013-09-17 NOTE — Assessment & Plan Note (Signed)
I spent some time counseling the patient the importance of tobacco cessation. he is currently attempting to quit on his own 

## 2013-09-17 NOTE — Progress Notes (Signed)
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   08/13/2013 Procedure Laryngoscopy showed the right false vocal fold and the right and midline laryngeal surface of the epiglottis showed a fungating, irregular mass   08/26/2013 Imaging PET scan showed right supraglottic mass with metastatic bilateral level II and III lymph nodes. Resultant severe narrowing of the airway at or just above the vocal folds.    09/15/2013 Procedure He has placement of feeding tube and port.   09/16/2013 -  Chemotherapy He started on high dose cisplatin chemotherapy with radiation   09/16/2013 -  Radiation Therapy He started on daily radiation therapy    INTERVAL HISTORY: Please see below for problem oriented charting. He is seen after cycle 1 of treatment. So far he tolerated treatment well without any side effects. Denies nausea. He is mildly constipated. He's been taking pain medicine daily for mild throat pain.  REVIEW OF SYSTEMS:   Constitutional: Denies fevers, chills or abnormal weight loss Eyes: Denies blurriness of vision Respiratory: Denies cough, dyspnea or wheezes Cardiovascular: Denies palpitation, chest discomfort or lower extremity swelling Skin: Denies abnormal skin rashes Lymphatics: Denies new lymphadenopathy or easy bruising Neurological:Denies numbness, tingling or new  weaknesses Behavioral/Psych: Mood is stable, no new changes  All other systems were reviewed with the patient and are negative.  I have reviewed the past medical history, past surgical history, social history and family history with the patient and they are unchanged from previous note.  ALLERGIES:  has No Known Allergies.  MEDICATIONS:  Current Outpatient Prescriptions  Medication Sig Dispense Refill  . acetaminophen (TYLENOL) 325 MG tablet Take 650 mg by mouth every 6 (six) hours as needed (pain).      Marland Kitchen aspirin EC 81 MG tablet Take 81 mg by mouth daily.      . cholecalciferol (VITAMIN D) 400 UNITS TABS tablet Take 800 Units by mouth daily.      . cycloSPORINE (RESTASIS) 0.05 % ophthalmic emulsion Place 1 drop into both eyes 2 (two) times daily.      Marland Kitchen guaifenesin (ROBITUSSIN) 100 MG/5ML syrup Take 200 mg by mouth 3 (three) times daily as needed for cough.      . hydrocerin (EUCERIN) CREA Apply 1 application topically 2 (two) times daily. Spread to bilateral palm twice daily      . HYDROCODONE-ACETAMINOPHEN PO Take 15 mLs by mouth every 4 (four) hours as needed (Hydorcodone 7.5/325mg /32ml oral solution.Pain).       Marland Kitchen ibuprofen (ADVIL,MOTRIN) 400 MG tablet Take 400 mg by mouth every 6 (six) hours as needed for headache or mild pain.      Marland Kitchen lidocaine-prilocaine (EMLA) cream Apply 1 application topically as needed.  30 g  6  . lisinopril (PRINIVIL,ZESTRIL) 10 MG tablet Take 10 mg by mouth every morning.       . ondansetron (ZOFRAN) 4 MG  tablet Take 4 mg by mouth 3 (three) times daily as needed for nausea or vomiting.       Marland Kitchen OVER THE COUNTER MEDICATION Take 237 mLs by mouth 3 (three) times daily. [employer]      . promethazine (PHENERGAN) 25 MG tablet Take 1 tablet (25 mg total) by mouth every 6 (six) hours as needed for nausea.  60 tablet  3  . risperiDONE (RISPERDAL) 2 MG tablet Take 2 mg by mouth 2 (two) times daily.      . simvastatin (ZOCOR) 10 MG tablet Take 10 mg by mouth at  bedtime.      Marland Kitchen HYDROmorphone (DILAUDID) 2 MG tablet Take 0.5 tablets (1 mg total) by mouth every 4 (four) hours as needed for moderate pain or severe pain.  30 tablet  0  . polyethylene glycol (MIRALAX) packet Take 17 g by mouth daily.  14 each  0   No current facility-administered medications for this visit.    PHYSICAL EXAMINATION: ECOG PERFORMANCE STATUS: 1 - Symptomatic but completely ambulatory  Filed Vitals:   09/17/13 0853  BP: 110/62  Pulse: 86  Temp: 98.3 F (36.8 C)  Resp: 18   Filed Weights   09/17/13 0853  Weight: 158 lb 11.2 oz (71.986 kg)    GENERAL:alert, no distress and comfortable SKIN: skin color, texture, turgor are normal, no rashes or significant lesions EYES: normal, Conjunctiva are pink and non-injected, sclera clear OROPHARYNX:no exudate, no erythema and lips, buccal mucosa, and tongue normal . Poor dentition is noted LUNGS: clear to auscultation and percussion with normal breathing effort HEART: regular rate & rhythm and no murmurs and no lower extremity edema ABDOMEN:abdomen soft, non-tender and normal bowel sounds. Feeding tube site looks okay  Musculoskeletal:no cyanosis of digits and no clubbing  NEURO: alert & oriented x 3 with fluent speech, no focal motor/sensory deficits  LABORATORY DATA:  I have reviewed the data as listed    Component Value Date/Time   NA 137 09/15/2013 0940   K 3.9 09/15/2013 0940   CL 99 09/15/2013 0940   CO2 28 09/15/2013 0940   GLUCOSE 112* 09/15/2013 0940   BUN 9 09/15/2013 0940   CREATININE 0.62 09/15/2013 0940   CALCIUM 10.0 09/15/2013 0940   PROT 6.5 09/07/2013 1430   ALBUMIN 3.3* 09/07/2013 1430   AST 11 09/07/2013 1430   ALT 7 09/07/2013 1430   ALKPHOS 92 09/07/2013 1430   BILITOT <0.2* 09/07/2013 1430   GFRNONAA >90 09/15/2013 0940   GFRAA >90 09/15/2013 0940    No results found for this basename: SPEP, UPEP,  kappa and lambda light chains    Lab Results  Component Value Date   WBC 9.1 09/15/2013   NEUTROABS  6.3 09/15/2013   HGB 13.0 09/15/2013   HCT 38.2* 09/15/2013   MCV 87.4 09/15/2013   PLT 232 09/15/2013      Chemistry      Component Value Date/Time   NA 137 09/15/2013 0940   K 3.9 09/15/2013 0940   CL 99 09/15/2013 0940   CO2 28 09/15/2013 0940   BUN 9 09/15/2013 0940   CREATININE 0.62 09/15/2013 0940      Component Value Date/Time   CALCIUM 10.0 09/15/2013 0940   ALKPHOS 92 09/07/2013 1430   AST 11 09/07/2013 1430   ALT 7 09/07/2013 1430   BILITOT <0.2* 09/07/2013 1430       RADIOGRAPHIC STUDIES: I have personally reviewed the radiological images as listed and agreed with the  findings in the report. 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 He requires durable venous access for chemotherapy. Additionally, radiation therapy is planned with expected mucositis and dysphagia. Therefore, a semi elective percutaneous gastrostomy tube will also be placed to facilitate enteral nutrition if the patient becomes unable to tolerate oral feeding.  EXAM: IR RIGHT FLOURO GUIDE CV LINE; IR ULTRASOUND GUIDANCE VASC ACCESS RIGHT; PERC PLACEMENT GASTROSTOMY  Date: 09/15/2013  ANESTHESIA/SEDATION: Moderate (conscious) sedation was administered during this procedure. A total of 3 mg Versed and 150 mg Fentanyl were administered intravenously. The patient's vital signs were monitored continuously by radiology nursing throughout the course of the procedure.  Total sedation time: 55 minutes  FLUOROSCOPY TIME:  5 minutes 12 seconds  TECHNIQUE: Written informed consent was obtained from the patient prior to the procedure. An appropriate timeout procedure was performed. The right neck and chest and abdomen were prepped with chlorhexidine, and draped in the usual sterile fashion using maximum barrier technique (cap and mask, sterile gown, sterile gloves, large sterile sheet, hand hygiene and cutaneous antiseptic). Antibiotic  prophylaxis was provided with 2g Ancef administered IV one hour prior to skin incision.  PROCEDURE # 1:  Portacatheter placement  Ultrasound demonstrated patency of the right internal jugular vein, and this was documented with an image. Local anesthesia was attained by infiltration with 1% lidocaine. Under real-time ultrasound guidance, this vein was accessed with a 21 gauge micropuncture needle and image documentation was performed. A small dermatotomy was made at the access site with an 11 scalpel. A 0.018" wire was advanced into the SVC and the access needle exchanged for a 90F micropuncture vascular sheath. The 0.018" wire was then removed and a 0.035" wire advanced into the IVC.  An appropriate location for the subcutaneous reservoir was selected below the clavicle and an incision was made through the skin and underlying soft tissues. The subcutaneous tissues were then dissected using a combination of blunt and sharp surgical technique and a pocket was formed. A single lumen power injectable portacatheter was then tunneled through the subcutaneous tissues from the pocket to the dermatotomy and the port reservoir placed within the subcutaneous pocket.  The venous access site was then serially dilated and a peel away vascular sheath placed over the wire. The wire was removed and the port catheter advanced into position under fluoroscopic guidance. The catheter tip is positioned in the upper right atrium. This was documented with a spot image. The portacatheter was then tested and found to flush and aspirate well. The port was flushed with saline followed by 100 units/mL heparinized saline.  The pocket was then closed in two layers using first subdermal inverted interrupted absorbable sutures followed by a running subcuticular suture. The epidermis was then sealed with Dermabond. The dermatotomy at the venous access site was also closed with a single inverted subdermal suture and the epidermis sealed with  Dermabond.  PROCEDURE #2:  Percutaneous gastrostomy tube placement  An angled catheter was advanced over a wire under fluoroscopic guidance through the nose, down the esophagus and into the body of the stomach. The stomach was then insufflated with several 100 ml of air. Fluoroscopy confirmed location of the gastric bubble, as well as inferior displacement of the barium stained colon. Local anesthesia was attained by infiltration with 1% lidocaine. Under direct fluoroscopic guidance, a single T-tack was placed, and the anterior gastric wall drawn up against the anterior abdominal wall.  Percutaneous access was then obtained into the mid gastric body with an 18 gauge sheath needle. Aspiration of air, and injection of contrast material under fluoroscopy confirmed needle placement.  An Amplatz wire was advanced in the gastric body and the access needle exchanged for a 9-French vascular sheath. A snare device was advanced through the vascular sheath and an Amplatz wire advanced through the angled catheter. The Amplatz wire was successfully snared and this was pulled up through the esophagus and out the mouth. A 20-French Alinda Dooms MIC-PEG tube was then connected to the snare and pulled through the mouth, down the esophagus, into the stomach and out to the anterior abdominal wall. Hand injection of contrast material confirmed intragastric location. The T-tack retention suture was then cut. The pull through peg tube was then secured with the external bumper and capped.  The patient will be observed for several hours with the newly placed tube on low wall suction to evaluate for any post procedure complication. The patient tolerated the procedure well, there is no immediate complication.  COMPLICATIONS: None.  The patient tolerated the procedure well.  IMPRESSION: 1. Successful placement of a right IJ approach Power Port with ultrasound and fluoroscopic guidance. The catheter is ready for use. 2. Successful placement  of a 20 French pull-through percutaneous gastrostomy tube. Signed,  Criselda Peaches, MD  [facility]  [facility]   Electronically Signed   By: Jacqulynn Cadet M.D.   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 He requires durable venous access for chemotherapy. Additionally, radiation therapy is planned with expected mucositis and dysphagia. Therefore, a semi elective percutaneous gastrostomy tube will also be placed to facilitate enteral nutrition if the patient becomes unable to tolerate oral feeding.  EXAM: IR RIGHT FLOURO GUIDE CV LINE; IR ULTRASOUND GUIDANCE VASC ACCESS RIGHT; PERC PLACEMENT GASTROSTOMY  Date: 09/15/2013  ANESTHESIA/SEDATION: Moderate (conscious) sedation was administered during this procedure. A total of 3 mg Versed and 150 mg Fentanyl were administered intravenously. The patient's vital signs were monitored continuously by radiology nursing throughout the course of the procedure.  Total sedation time: 55 minutes  FLUOROSCOPY TIME:  5 minutes 12 seconds  TECHNIQUE: Written informed consent was obtained from the patient prior to the procedure. An appropriate timeout procedure was performed. The right neck and chest and abdomen were prepped with chlorhexidine, and draped in the usual sterile fashion using maximum barrier technique (cap and mask, sterile gown, sterile gloves, large sterile sheet, hand hygiene and cutaneous antiseptic). Antibiotic prophylaxis was provided with 2g Ancef administered IV one hour prior to skin incision.  PROCEDURE # 1:  Portacatheter placement  Ultrasound demonstrated patency of the right internal jugular vein, and this was documented with an image. Local anesthesia was attained by infiltration with 1% lidocaine. Under real-time ultrasound guidance, this vein was accessed with a 21  gauge micropuncture needle and image documentation was performed. A small dermatotomy was made at the access site with an 11 scalpel. A 0.018" wire was advanced into the SVC and the access needle exchanged for a 50F micropuncture vascular sheath. The 0.018" wire was then removed and a 0.035" wire advanced into the IVC.  An appropriate location for the subcutaneous reservoir was selected below the clavicle and an incision was made through the skin and underlying soft tissues. The subcutaneous tissues were then dissected using a  combination of blunt and sharp surgical technique and a pocket was formed. A single lumen power injectable portacatheter was then tunneled through the subcutaneous tissues from the pocket to the dermatotomy and the port reservoir placed within the subcutaneous pocket.  The venous access site was then serially dilated and a peel away vascular sheath placed over the wire. The wire was removed and the port catheter advanced into position under fluoroscopic guidance. The catheter tip is positioned in the upper right atrium. This was documented with a spot image. The portacatheter was then tested and found to flush and aspirate well. The port was flushed with saline followed by 100 units/mL heparinized saline.  The pocket was then closed in two layers using first subdermal inverted interrupted absorbable sutures followed by a running subcuticular suture. The epidermis was then sealed with Dermabond. The dermatotomy at the venous access site was also closed with a single inverted subdermal suture and the epidermis sealed with Dermabond.  PROCEDURE #2:  Percutaneous gastrostomy tube placement  An angled catheter was advanced over a wire under fluoroscopic guidance through the nose, down the esophagus and into the body of the stomach. The stomach was then insufflated with several 100 ml of air. Fluoroscopy confirmed location of the gastric bubble, as well as inferior displacement of the barium stained  colon. Local anesthesia was attained by infiltration with 1% lidocaine. Under direct fluoroscopic guidance, a single T-tack was placed, and the anterior gastric wall drawn up against the anterior abdominal wall. Percutaneous access was then obtained into the mid gastric body with an 18 gauge sheath needle. Aspiration of air, and injection of contrast material under fluoroscopy confirmed needle placement.  An Amplatz wire was advanced in the gastric body and the access needle exchanged for a 9-French vascular sheath. A snare device was advanced through the vascular sheath and an Amplatz wire advanced through the angled catheter. The Amplatz wire was successfully snared and this was pulled up through the esophagus and out the mouth. A 20-French Alinda Dooms MIC-PEG tube was then connected to the snare and pulled through the mouth, down the esophagus, into the stomach and out to the anterior abdominal wall. Hand injection of contrast material confirmed intragastric location. The T-tack retention suture was then cut. The pull through peg tube was then secured with the external bumper and capped.  The patient will be observed for several hours with the newly placed tube on low wall suction to evaluate for any post procedure complication. The patient tolerated the procedure well, there is no immediate complication.  COMPLICATIONS: None.  The patient tolerated the procedure well.  IMPRESSION: 1. Successful placement of a right IJ approach Power Port with ultrasound and fluoroscopic guidance. The catheter is ready for use. 2. Successful placement of a 20 French pull-through percutaneous gastrostomy tube. Signed,  Criselda Peaches, MD  [facility]  [facility]   Electronically Signed   By: Jacqulynn Cadet M.D.   On: 09/15/2013 14:22   Ir US Guide Vasc Access Right  09/15/2013   CLINICAL DATA:  58 year old male with supraglottic squamous cell cancer with bilateral nodal  metastases. He requires durable venous access for chemotherapy. Additionally, radiation therapy is planned with expected mucositis and dysphagia. Therefore, a semi elective percutaneous gastrostomy tube will also be placed to facilitate enteral nutrition if the patient becomes unable to tolerate oral feeding.  EXAM: IR RIGHT FLOURO GUIDE CV LINE; IR ULTRASOUND GUIDANCE VASC ACCESS RIGHT; PERC PLACEMENT GASTROSTOMY  Date: 09/15/2013  ANESTHESIA/SEDATION: Moderate (conscious) sedation was administered during this procedure. A total of 3 mg Versed and 150 mg Fentanyl were administered intravenously. The patient's vital signs were monitored continuously by radiology nursing throughout the course of the procedure.  Total sedation time: 55 minutes  FLUOROSCOPY TIME:  5 minutes 12 seconds  TECHNIQUE: Written informed consent was obtained from the patient prior to the procedure. An appropriate timeout procedure was performed. The right neck and chest and abdomen were prepped with chlorhexidine, and draped in the usual sterile fashion using maximum barrier technique (cap and mask, sterile gown, sterile gloves, large sterile sheet, hand hygiene and cutaneous antiseptic). Antibiotic prophylaxis was provided with 2g Ancef administered IV one hour prior to skin incision.  PROCEDURE # 1:  Portacatheter placement  Ultrasound demonstrated patency of the right internal jugular vein, and this was documented with an image. Local anesthesia was attained by infiltration with 1% lidocaine. Under real-time ultrasound guidance, this vein was accessed with a 21 gauge micropuncture needle and image documentation was performed. A small dermatotomy was made at the access site with an 11 scalpel. A 0.018" wire was advanced into the SVC and the access needle exchanged for a 40F micropuncture vascular sheath. The 0.018" wire was then removed and a 0.035" wire advanced into the IVC.  An appropriate location for the subcutaneous reservoir was  selected below the clavicle and an incision was made through the skin and underlying soft tissues. The subcutaneous tissues were then dissected using a combination of blunt and sharp surgical technique and a pocket was formed. A single lumen power injectable portacatheter was then tunneled through the subcutaneous tissues from the pocket to the dermatotomy and the port reservoir placed within the subcutaneous pocket.  The venous access site was then serially dilated and a peel away vascular sheath placed over the wire. The wire was removed and the port catheter advanced into position under fluoroscopic guidance. The catheter tip is positioned in the upper right atrium. This was documented with a spot image. The portacatheter was then tested and found to flush and aspirate well. The port was flushed with saline followed by 100 units/mL heparinized saline.  The pocket was then closed in two layers using first subdermal inverted interrupted absorbable sutures followed by a running subcuticular suture. The epidermis was then sealed with Dermabond. The dermatotomy at the venous access site was also closed with a single inverted subdermal suture and the epidermis sealed with Dermabond.  PROCEDURE #2:  Percutaneous gastrostomy tube placement  An angled catheter was advanced over a wire under fluoroscopic guidance through the nose, down the esophagus and into the body of the stomach. The stomach was then insufflated with several 100 ml of air. Fluoroscopy confirmed location of the gastric bubble, as well as inferior displacement of the barium stained colon. Local anesthesia was attained by infiltration with 1% lidocaine. Under direct fluoroscopic guidance, a single T-tack was placed, and the anterior gastric wall drawn up against the anterior abdominal wall. Percutaneous access was then obtained into the mid gastric body with an 18 gauge sheath needle. Aspiration of air, and injection of contrast material under fluoroscopy  confirmed needle placement.  An Amplatz wire was advanced in the gastric body and the access needle exchanged for a 9-French vascular sheath. A snare device was advanced through the vascular sheath and an Amplatz wire advanced through the angled catheter. The Amplatz wire was successfully snared and this was pulled up through the esophagus and out the mouth. A 20-French  Alinda Dooms MIC-PEG tube was then connected to the snare and pulled through the mouth, down the esophagus, into the stomach and out to the anterior abdominal wall. Hand injection of contrast material confirmed intragastric location. The T-tack retention suture was then cut. The pull through peg tube was then secured with the external bumper and capped.  The patient will be observed for several hours with the newly placed tube on low wall suction to evaluate for any post procedure complication. The patient tolerated the procedure well, there is no immediate complication.  COMPLICATIONS: None.  The patient tolerated the procedure well.  IMPRESSION: 1. Successful placement of a right IJ approach Power Port with ultrasound and fluoroscopic guidance. The catheter is ready for use. 2. Successful placement of a 20 French pull-through percutaneous gastrostomy tube. Signed,  Criselda Peaches, MD  [facility]  [facility]   Electronically Signed   By: Jacqulynn Cadet M.D.   On: 09/15/2013 14:22     ASSESSMENT & PLAN:  Malignant neoplasm of supraglottis He was started on treatment yesterday. So far he tolerated treatment well. Continue weekly toxicity review and supportive care.  Tobacco abuse I spent some time counseling the patient the importance of tobacco cessation. he is currently attempting to quit on his own.    Narrowing of airway So far, clinically, he has no symptoms of stridor or imminent airway obstruction.  Throat pain I gave him prescription hydromorphone. I want to avoid  Tylenol-containing products if possible. We discussed narcotics refill policy.  Constipation I recommend increase oral fluid intake. I gave him prescription laxatives to take daily.  Schizophrenia According to caregivers, he has no signs of change in mental health.  S/P gastrostomy His feeding tube site looks okay without signs of infection.   No orders of the defined types were placed in this encounter.   All questions were answered. The patient knows to call the clinic with any problems, questions or concerns. No barriers to learning was detected. I spent 25 minutes counseling the patient face to face. The total time spent in the appointment was 30 minutes and more than 50% was on counseling and review of test results     [facility], [place], MD 09/17/2013 9:53 AM

## 2013-09-17 NOTE — Telephone Encounter (Signed)
Pt had first chemo yesterday 8/19.   Pt had office visit with Dr. Alvy Bimler today 09/17/13.

## 2013-09-17 NOTE — Assessment & Plan Note (Signed)
According to caregivers, he has no signs of change in mental health.

## 2013-09-17 NOTE — Assessment & Plan Note (Signed)
I gave him prescription hydromorphone. I want to avoid Tylenol-containing products if possible. We discussed narcotics refill policy.

## 2013-09-18 ENCOUNTER — Ambulatory Visit
Admission: RE | Admit: 2013-09-18 | Discharge: 2013-09-18 | Disposition: A | Discharge status: Home / Self Care | Payer: Medicare Other | Source: Ambulatory Visit | Attending: Radiation Oncology | Admitting: Radiation Oncology

## 2013-09-18 ENCOUNTER — Telehealth: Payer: Self-pay | Admitting: Hematology and Oncology

## 2013-09-18 DIAGNOSIS — Z510 Encounter for antineoplastic radiation therapy: Secondary | ICD-10-CM | POA: Diagnosis not present

## 2013-09-18 NOTE — Progress Notes (Signed)
To provide support, encouragement and care continuity, met with patient during his initial Tomo tmt; he tolerated procedure well.  I escorted him to Infusion for his first chemo, during a later check, he appeared to be tolerating the procedure well; his RN confirmed.  Gayleen Orem, RN, BSN, [place] at [place] [phone]

## 2013-09-18 NOTE — Telephone Encounter (Signed)
s.w. pt pt and advised on Aug appt

## 2013-09-18 NOTE — Progress Notes (Signed)
Met patient in Infusion to provide instruction for PEG flushing.  Using Teach Back and return demonstration, Marya Amsler correctly explained the purpose of daily PEG flushes and correctly demonstrated to this nurse the correct technique.    Gayleen Orem, RN, BSN, [place] at [place] [phone]

## 2013-09-21 ENCOUNTER — Ambulatory Visit
Admission: RE | Admit: 2013-09-21 | Discharge: 2013-09-21 | Disposition: A | Discharge status: Home / Self Care | Payer: Medicare Other | Source: Ambulatory Visit | Attending: Radiation Oncology | Admitting: Radiation Oncology

## 2013-09-21 ENCOUNTER — Encounter: Payer: Self-pay | Admitting: *Deleted

## 2013-09-21 VITALS — BP 80/57 | HR 99 | Temp 98.9°F | Wt 152.4 lb

## 2013-09-21 DIAGNOSIS — C321 Malignant neoplasm of supraglottis: Secondary | ICD-10-CM

## 2013-09-21 DIAGNOSIS — Z510 Encounter for antineoplastic radiation therapy: Secondary | ICD-10-CM | POA: Diagnosis not present

## 2013-09-21 MED ORDER — BIAFINE EX EMUL
CUTANEOUS | Status: DC | PRN
  Administered 2013-09-21: 15:00:00 via TOPICAL

## 2013-09-21 NOTE — Addendum Note (Signed)
Encounter addended by: Deirdre Evener, RN on: 09/21/2013  6:48 PM<BR>     Documentation filed: Demographics Visit

## 2013-09-21 NOTE — Progress Notes (Addendum)
Weekly assessment of radiation to supraglottic.Today will make 4 out of 35.started having headaches on Saturday morning.Blood pressure is very low.Needs to hold lisinipril, I will speak with caretaker.Had some dizziness this morning.Peg site cleaned with sterile saline and then applied neosporin.Site unremarkable except for old dried blood.No signs of infection.Patient states he has been flushing with water daily.

## 2013-09-21 NOTE — Progress Notes (Addendum)
   Weekly Management Note:  outpatient    ICD-9-CM ICD-10-CM   1. Malignant neoplasm of supraglottis 161.1 C32.1 topical emolient (BIAFINE) emulsion    Current Dose:  8 Gy  Projected Dose: 70 Gy   Narrative:  The patient presents for routine under treatment assessment.  CBCT/MVCT images/Port film x-rays were reviewed.  The chart was checked. Started having headaches on Saturday morning. Blood pressure is very low. Needs to hold Lisinopril, Val RN will speak with caretaker. Had some dizziness this morning.Peg site cleaned with sterile saline and then applied neosporin.Site unremarkable except for old dried blood. No signs of infection.Patient states he has been flushing with water daily, and drinking plenty of H20 as well.   Physical Findings:  weight is 152 lb 6.4 oz (69.128 kg). His temperature is 98.9 F (37.2 C). His blood pressure is 80/57 and his pulse is 99. His oxygen saturation is 97%.  NAD, mucosa moist in mouth. Oropharyngeal mucosa is intact with no thrush or lesions.   Skin intact and smooth over neck.     CBC    Component Value Date/Time   WBC 9.1 09/15/2013 0940   WBC 9.5 09/07/2013 1428   RBC 4.37 09/15/2013 0940   RBC 4.06* 09/07/2013 1428   HGB 13.0 09/15/2013 0940   HGB 12.3* 09/07/2013 1428   HCT 38.2* 09/15/2013 0940   HCT 35.8* 09/07/2013 1428   PLT 232 09/15/2013 0940   PLT 190 09/07/2013 1428   MCV 87.4 09/15/2013 0940   MCV 88.2 09/07/2013 1428   MCH 29.7 09/15/2013 0940   MCH 30.3 09/07/2013 1428   MCHC 34.0 09/15/2013 0940   MCHC 34.4 09/07/2013 1428   RDW 13.8 09/15/2013 0940   RDW 13.6 09/07/2013 1428   LYMPHSABS 1.8 09/15/2013 0940   LYMPHSABS 1.8 09/07/2013 1428   MONOABS 0.8 09/15/2013 0940   MONOABS 1.1* 09/07/2013 1428   EOSABS 0.2 09/15/2013 0940   EOSABS 0.3 09/07/2013 1428   BASOSABS 0.0 09/15/2013 0940   BASOSABS 0.0 09/07/2013 1428     CMP     Component Value Date/Time   NA 137 09/15/2013 0940   K 3.9 09/15/2013 0940   CL 99 09/15/2013 0940   CO2 28  09/15/2013 0940   GLUCOSE 112* 09/15/2013 0940   BUN 9 09/15/2013 0940   CREATININE 0.62 09/15/2013 0940   CALCIUM 10.0 09/15/2013 0940   PROT 6.5 09/07/2013 1430   ALBUMIN 3.3* 09/07/2013 1430   AST 11 09/07/2013 1430   ALT 7 09/07/2013 1430   ALKPHOS 92 09/07/2013 1430   BILITOT <0.2* 09/07/2013 1430   GFRNONAA >90 09/15/2013 0940   GFRAA >90 09/15/2013 0940     Impression:  The patient is tolerating radiotherapy.   Plan:  Continue radiotherapy as planned. Hold BP meds. To be evaluated for IV fluids later this week in med/onc.  ADDENDUM: Specifically, I will ask that the patient's care facility hold his LISINOPRIL.  -----------------------------------  Eppie Gibson, MD

## 2013-09-21 NOTE — Addendum Note (Signed)
Encounter addended by: Deirdre Evener, RN on: 09/21/2013  6:52 PM<BR>     Documentation filed: Demographics Visit

## 2013-09-22 ENCOUNTER — Telehealth: Payer: Self-pay | Admitting: *Deleted

## 2013-09-22 ENCOUNTER — Other Ambulatory Visit: Payer: Self-pay | Admitting: Hematology and Oncology

## 2013-09-22 ENCOUNTER — Telehealth: Payer: Self-pay | Admitting: Hematology and Oncology

## 2013-09-22 ENCOUNTER — Ambulatory Visit
Admission: RE | Admit: 2013-09-22 | Discharge: 2013-09-22 | Disposition: A | Discharge status: Home / Self Care | Payer: Medicare Other | Source: Ambulatory Visit | Attending: Radiation Oncology | Admitting: Radiation Oncology

## 2013-09-22 DIAGNOSIS — Z510 Encounter for antineoplastic radiation therapy: Secondary | ICD-10-CM | POA: Diagnosis not present

## 2013-09-22 NOTE — Telephone Encounter (Signed)
, °

## 2013-09-22 NOTE — Telephone Encounter (Signed)
Per staff phone call and POF I have schedueld appts. Scheduler advised of appts.  JMW  

## 2013-09-22 NOTE — Telephone Encounter (Signed)
s.w. pt living facility and advised on all appts...Marland Kitchenok and aware

## 2013-09-22 NOTE — Telephone Encounter (Signed)
Spoke with Ileene Rubens at patient's care facility.  Per her guidance, I faxed [phone]) Dr. Pearlie Oyster note from yesterday in which she directs the patient to stop taking lisinopril.  Fax confirmation received, verbal confirmation pending.  Gayleen Orem, RN, BSN, [place] at [place] [phone]

## 2013-09-22 NOTE — Addendum Note (Signed)
Encounter addended by: Eppie Gibson, MD on: 09/22/2013  9:55 AM<BR>     Documentation filed: Notes Section

## 2013-09-23 ENCOUNTER — Telehealth: Payer: Self-pay | Admitting: Hematology and Oncology

## 2013-09-23 ENCOUNTER — Ambulatory Visit (HOSPITAL_COMMUNITY): Payer: Medicaid - Dental | Admitting: Dentistry

## 2013-09-23 ENCOUNTER — Encounter: Payer: Self-pay | Admitting: *Deleted

## 2013-09-23 ENCOUNTER — Encounter: Payer: Self-pay | Admitting: Hematology and Oncology

## 2013-09-23 ENCOUNTER — Other Ambulatory Visit (HOSPITAL_BASED_OUTPATIENT_CLINIC_OR_DEPARTMENT_OTHER): Payer: Medicare Other

## 2013-09-23 ENCOUNTER — Encounter (HOSPITAL_COMMUNITY): Payer: Self-pay | Admitting: Dentistry

## 2013-09-23 ENCOUNTER — Ambulatory Visit
Admission: RE | Admit: 2013-09-23 | Discharge: 2013-09-23 | Disposition: A | Discharge status: Home / Self Care | Payer: Medicare Other | Source: Ambulatory Visit | Attending: Radiation Oncology | Admitting: Radiation Oncology

## 2013-09-23 ENCOUNTER — Ambulatory Visit (HOSPITAL_BASED_OUTPATIENT_CLINIC_OR_DEPARTMENT_OTHER): Payer: Medicare Other

## 2013-09-23 ENCOUNTER — Other Ambulatory Visit: Payer: Self-pay | Admitting: Hematology and Oncology

## 2013-09-23 ENCOUNTER — Ambulatory Visit (HOSPITAL_BASED_OUTPATIENT_CLINIC_OR_DEPARTMENT_OTHER): Payer: Medicare Other | Admitting: Hematology and Oncology

## 2013-09-23 VITALS — BP 114/71 | HR 73 | Temp 98.2°F | Wt 155.0 lb

## 2013-09-23 VITALS — BP 115/69 | HR 69 | Temp 97.1°F

## 2013-09-23 VITALS — BP 115/75 | HR 79 | Temp 98.5°F | Resp 19 | Ht 70.5 in | Wt 152.0 lb

## 2013-09-23 DIAGNOSIS — K117 Disturbances of salivary secretion: Secondary | ICD-10-CM

## 2013-09-23 DIAGNOSIS — R1310 Dysphagia, unspecified: Secondary | ICD-10-CM

## 2013-09-23 DIAGNOSIS — R070 Pain in throat: Secondary | ICD-10-CM

## 2013-09-23 DIAGNOSIS — R682 Dry mouth, unspecified: Secondary | ICD-10-CM

## 2013-09-23 DIAGNOSIS — F209 Schizophrenia, unspecified: Secondary | ICD-10-CM

## 2013-09-23 DIAGNOSIS — Z0189 Encounter for other specified special examinations: Secondary | ICD-10-CM

## 2013-09-23 DIAGNOSIS — E46 Unspecified protein-calorie malnutrition: Secondary | ICD-10-CM | POA: Insufficient documentation

## 2013-09-23 DIAGNOSIS — I10 Essential (primary) hypertension: Secondary | ICD-10-CM

## 2013-09-23 DIAGNOSIS — F17200 Nicotine dependence, unspecified, uncomplicated: Secondary | ICD-10-CM

## 2013-09-23 DIAGNOSIS — Z931 Gastrostomy status: Secondary | ICD-10-CM

## 2013-09-23 DIAGNOSIS — K083 Retained dental root: Secondary | ICD-10-CM

## 2013-09-23 DIAGNOSIS — K045 Chronic apical periodontitis: Secondary | ICD-10-CM

## 2013-09-23 DIAGNOSIS — C321 Malignant neoplasm of supraglottis: Secondary | ICD-10-CM

## 2013-09-23 DIAGNOSIS — K029 Dental caries, unspecified: Secondary | ICD-10-CM

## 2013-09-23 DIAGNOSIS — Z720 Tobacco use: Secondary | ICD-10-CM

## 2013-09-23 DIAGNOSIS — J988 Other specified respiratory disorders: Secondary | ICD-10-CM

## 2013-09-23 DIAGNOSIS — Z510 Encounter for antineoplastic radiation therapy: Secondary | ICD-10-CM | POA: Diagnosis not present

## 2013-09-23 LAB — CBC WITH DIFFERENTIAL/PLATELET
BASO%: 0.4 % (ref 0.0–2.0)
Basophils Absolute: 0 10*3/uL (ref 0.0–0.1)
EOS ABS: 0.1 10*3/uL (ref 0.0–0.5)
EOS%: 1.7 % (ref 0.0–7.0)
HCT: 36.6 % — ABNORMAL LOW (ref 38.4–49.9)
HGB: 12.1 g/dL — ABNORMAL LOW (ref 13.0–17.1)
LYMPH%: 9.7 % — AB (ref 14.0–49.0)
MCH: 29.5 pg (ref 27.2–33.4)
MCHC: 33.2 g/dL (ref 32.0–36.0)
MCV: 89.1 fL (ref 79.3–98.0)
MONO#: 1 10*3/uL — ABNORMAL HIGH (ref 0.1–0.9)
MONO%: 12.6 % (ref 0.0–14.0)
NEUT#: 5.7 10*3/uL (ref 1.5–6.5)
NEUT%: 75.6 % — ABNORMAL HIGH (ref 39.0–75.0)
PLATELETS: 177 10*3/uL (ref 140–400)
RBC: 4.11 10*6/uL — AB (ref 4.20–5.82)
RDW: 13.7 % (ref 11.0–14.6)
WBC: 7.6 10*3/uL (ref 4.0–10.3)
lymph#: 0.7 10*3/uL — ABNORMAL LOW (ref 0.9–3.3)

## 2013-09-23 LAB — COMPREHENSIVE METABOLIC PANEL (CC13)
ALT: 11 U/L (ref 0–55)
AST: 13 U/L (ref 5–34)
Albumin: 3 g/dL — ABNORMAL LOW (ref 3.5–5.0)
Alkaline Phosphatase: 73 U/L (ref 40–150)
Anion Gap: 7 mEq/L (ref 3–11)
BUN: 9 mg/dL (ref 7.0–26.0)
CO2: 31 mEq/L — ABNORMAL HIGH (ref 22–29)
Calcium: 9.5 mg/dL (ref 8.4–10.4)
Chloride: 91 mEq/L — ABNORMAL LOW (ref 98–109)
Creatinine: 0.7 mg/dL (ref 0.7–1.3)
GLUCOSE: 148 mg/dL — AB (ref 70–140)
Potassium: 3.7 mEq/L (ref 3.5–5.1)
Sodium: 129 mEq/L — ABNORMAL LOW (ref 136–145)
Total Bilirubin: 0.3 mg/dL (ref 0.20–1.20)
Total Protein: 6.4 g/dL (ref 6.4–8.3)

## 2013-09-23 LAB — MAGNESIUM (CC13): Magnesium: 1.9 mg/dl (ref 1.5–2.5)

## 2013-09-23 MED ORDER — SODIUM CHLORIDE 0.9 % IJ SOLN
10.0000 mL | INTRAMUSCULAR | Status: DC | PRN
  Administered 2013-09-23: 10 mL
  Filled 2013-09-23: qty 10

## 2013-09-23 MED ORDER — HEPARIN SOD (PORK) LOCK FLUSH 100 UNIT/ML IV SOLN
500.0000 [IU] | Freq: Once | INTRAVENOUS | Status: AC | PRN
  Administered 2013-09-23: 500 [IU]
  Filled 2013-09-23: qty 5

## 2013-09-23 MED ORDER — SODIUM CHLORIDE 0.9 % IV SOLN
Freq: Once | INTRAVENOUS | Status: AC
  Administered 2013-09-23: 10:00:00 via INTRAVENOUS

## 2013-09-23 NOTE — Assessment & Plan Note (Signed)
His blood pressure is normal and even borderline low.  I told him to discontinue lisinopril due to risk of renal failure.

## 2013-09-23 NOTE — Progress Notes (Signed)
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   08/13/2013 Procedure Laryngoscopy showed the right false vocal fold and the right and midline laryngeal surface of the epiglottis showed a fungating, irregular mass   08/26/2013 Imaging PET scan showed right supraglottic mass with metastatic bilateral level II and III lymph nodes. Resultant severe narrowing of the airway at or just above the vocal folds.    09/15/2013 Procedure He has placement of feeding tube and port.   09/16/2013 -  Chemotherapy He started on high dose cisplatin chemotherapy with radiation   09/16/2013 -  Radiation Therapy He started on daily radiation therapy    INTERVAL HISTORY: Please see below for problem oriented charting. He complained of worsening throat pain. The prescription hydromorphone is helping with the pain. He had nausea last week but no vomiting. His blood pressure was noted to be low recently. He is noted to have 6 pound weight loss.  REVIEW OF SYSTEMS:   Constitutional: Denies fevers, chills  Eyes: Denies blurriness of vision Respiratory: Denies cough, dyspnea or wheezes Cardiovascular: Denies palpitation, chest discomfort or lower extremity swelling Gastrointestinal:  Denies nausea, heartburn or change in bowel habits Skin: Denies abnormal skin rashes Lymphatics: Denies new  lymphadenopathy or easy bruising Neurological:Denies numbness, tingling or new weaknesses Behavioral/Psych: Mood is stable, no new changes  All other systems were reviewed with the patient and are negative.  I have reviewed the past medical history, past surgical history, social history and family history with the patient and they are unchanged from previous note.  ALLERGIES:  has No Known Allergies.  MEDICATIONS:  Current Outpatient Prescriptions  Medication Sig Dispense Refill  . acetaminophen (TYLENOL) 325 MG tablet Take 650 mg by mouth every 6 (six) hours as needed (pain).      Marland Kitchen aspirin EC 81 MG tablet Take 81 mg by mouth daily.      . cholecalciferol (VITAMIN D) 400 UNITS TABS tablet Take 800 Units by mouth daily.      . cycloSPORINE (RESTASIS) 0.05 % ophthalmic emulsion Place 1 drop into both eyes 2 (two) times daily.      Marland Kitchen emollient (BIAFINE) cream Apply topically 2 (two) times daily.      Marland Kitchen guaifenesin (ROBITUSSIN) 100 MG/5ML syrup Take 200 mg by mouth 3 (three) times daily as needed for cough.      . hydrocerin (EUCERIN) CREA Apply 1 application topically 2 (two) times daily. Spread to bilateral palm twice daily      . HYDROCODONE-ACETAMINOPHEN PO Take 15 mLs by mouth every 4 (four) hours as needed (Hydorcodone 7.5/325mg /69ml oral solution.Pain).       Marland Kitchen HYDROmorphone (DILAUDID) 2 MG tablet Take 0.5 tablets (1 mg total) by mouth every 4 (four) hours as needed for moderate pain or severe pain.  30 tablet  0  . ibuprofen (ADVIL,MOTRIN) 400 MG tablet Take  400 mg by mouth every 6 (six) hours as needed for headache or mild pain.      Marland Kitchen lidocaine-prilocaine (EMLA) cream Apply 1 application topically as needed.  30 g  6  . ondansetron (ZOFRAN) 4 MG tablet Take 4 mg by mouth 3 (three) times daily as needed for nausea or vomiting.       Marland Kitchen OVER THE COUNTER MEDICATION Take 237 mLs by mouth 3 (three) times daily. [employer]      . polyethylene glycol (MIRALAX) packet Take 17 g by mouth  daily.  14 each  0  . promethazine (PHENERGAN) 25 MG tablet Take 1 tablet (25 mg total) by mouth every 6 (six) hours as needed for nausea.  60 tablet  3  . risperiDONE (RISPERDAL) 2 MG tablet Take 2 mg by mouth 2 (two) times daily.      . simvastatin (ZOCOR) 10 MG tablet Take 10 mg by mouth at bedtime.      Marland Kitchen lisinopril (PRINIVIL,ZESTRIL) 10 MG tablet Take 10 mg by mouth every morning.        No current facility-administered medications for this visit.    PHYSICAL EXAMINATION: ECOG PERFORMANCE STATUS: 1 - Symptomatic but completely ambulatory  Filed Vitals:   09/23/13 0848  BP: 115/75  Pulse: 79  Temp: 98.5 F (36.9 C)  Resp: 19   Filed Weights   09/23/13 0848  Weight: 152 lb (68.947 kg)    GENERAL:alert, no distress and comfortable SKIN: skin color, texture, turgor are normal, no rashes or significant lesions EYES: normal, Conjunctiva are pink and non-injected, sclera clear OROPHARYNX:no exudate, no erythema and lips, buccal mucosa is normal and tongue is dry. Poor dentition is noted NECK: supple, thyroid normal size, non-tender, without nodularity LYMPH:  no palpable lymphadenopathy in the cervical, axillary or inguinal LUNGS: clear to auscultation and percussion with normal breathing effort HEART: regular rate & rhythm and no murmurs and no lower extremity edema ABDOMEN:abdomen soft, non-tender and normal bowel sounds. Feeding tube site looks okay Musculoskeletal:no cyanosis of digits and no clubbing  NEURO: alert & oriented x 3 with fluent speech, no focal motor/sensory deficits  LABORATORY DATA:  I have reviewed the data as listed    Component Value Date/Time   NA 137 09/15/2013 0940   K 3.9 09/15/2013 0940   CL 99 09/15/2013 0940   CO2 28 09/15/2013 0940   GLUCOSE 112* 09/15/2013 0940   BUN 9 09/15/2013 0940   CREATININE 0.62 09/15/2013 0940   CALCIUM 10.0 09/15/2013 0940   PROT 6.5 09/07/2013 1430   ALBUMIN 3.3* 09/07/2013 1430   AST 11 09/07/2013 1430   ALT 7  09/07/2013 1430   ALKPHOS 92 09/07/2013 1430   BILITOT <0.2* 09/07/2013 1430   GFRNONAA >90 09/15/2013 0940   GFRAA >90 09/15/2013 0940    No results found for this basename: SPEP, UPEP,  kappa and lambda light chains    Lab Results  Component Value Date   WBC 7.6 09/23/2013   NEUTROABS 5.7 09/23/2013   HGB 12.1* 09/23/2013   HCT 36.6* 09/23/2013   MCV 89.1 09/23/2013   PLT 177 09/23/2013      Chemistry      Component Value Date/Time   NA 137 09/15/2013 0940   K 3.9 09/15/2013 0940   CL 99 09/15/2013 0940   CO2 28 09/15/2013 0940   BUN 9 09/15/2013 0940   CREATININE 0.62 09/15/2013 0940      Component Value Date/Time   CALCIUM 10.0 09/15/2013  0940   ALKPHOS 92 09/07/2013 1430   AST 11 09/07/2013 1430   ALT 7 09/07/2013 1430   BILITOT <0.2* 09/07/2013 1430     ASSESSMENT & PLAN:  Malignant neoplasm of supraglottis He is tolerating treatment well apart from minor, expected side effects. He will continue weekly followup here for aggressive supportive care.  Tobacco abuse I spent some time counseling the patient the importance of tobacco cessation. he is currently attempting to quit on his own.      Schizophrenia According to caregivers, he has no signs of change in mental health.    Throat pain I gave him prescription hydromorphone. I want to avoid Tylenol-containing products if possible. We discussed narcotics refill policy. He is not sure about his pain medication. I recommend he brings his pill bottles tomorrow for review   Hypertension His blood pressure is normal and even borderline low.  I told him to discontinue lisinopril due to risk of renal failure.    Decay, teeth There is no signs of abscess. He has a visit with the dentist today.  S/P gastrostomy His feeding tube site looks okay without signs of infection.    Narrowing of airway He has no signs of airway obstruction or stridor.  Protein calorie malnutrition He has 6 pound weight loss in one week. I  suspect the majority of the weight loss is due to dehydration. The patient is able to swallow food but with recent low blood pressure and throat pain, suspect he is mildly dehydrated I recommend IV fluids for 3 days    No orders of the defined types were placed in this encounter.   All questions were answered. The patient knows to call the clinic with any problems, questions or concerns. No barriers to learning was detected. I spent 30 minutes counseling the patient face to face. The total time spent in the appointment was 40 minutes and more than 50% was on counseling and review of test results     [facility], [place], MD 09/23/2013 9:01 AM

## 2013-09-23 NOTE — Telephone Encounter (Signed)
gv adn pritned appt sched and avs for pt for Aug and Sept....sed added tx.

## 2013-09-23 NOTE — Assessment & Plan Note (Signed)
There is no signs of abscess. He has a visit with the dentist today.

## 2013-09-23 NOTE — Assessment & Plan Note (Addendum)
I gave him prescription hydromorphone. I want to avoid Tylenol-containing products if possible. We discussed narcotics refill policy. He is not sure about his pain medication. I recommend he brings his pill bottles tomorrow for review

## 2013-09-23 NOTE — Assessment & Plan Note (Signed)
According to caregivers, he has no signs of change in mental health.

## 2013-09-23 NOTE — Assessment & Plan Note (Signed)
He has 6 pound weight loss in one week. I suspect the majority of the weight loss is due to dehydration. The patient is able to swallow food but with recent low blood pressure and throat pain, suspect he is mildly dehydrated I recommend IV fluids for 3 days

## 2013-09-23 NOTE — Assessment & Plan Note (Signed)
He is tolerating treatment well apart from minor, expected side effects. He will continue weekly followup here for aggressive supportive care.

## 2013-09-23 NOTE — Patient Instructions (Signed)
Dehydration, Adult Dehydration is when you lose more fluids from the body than you take in. Vital organs like the kidneys, brain, and heart cannot function without a proper amount of fluids and salt. Any loss of fluids from the body can cause dehydration.  CAUSES   Vomiting.  Diarrhea.  Excessive sweating.  Excessive urine output.  Fever. SYMPTOMS  Mild dehydration  Thirst.  Dry lips.  Slightly dry mouth. Moderate dehydration  Very dry mouth.  Sunken eyes.  Skin does not bounce back quickly when lightly pinched and released.  Dark urine and decreased urine production.  Decreased tear production.  Headache. Severe dehydration  Very dry mouth.  Extreme thirst.  Rapid, weak pulse (more than 100 beats per minute at rest).  Cold hands and feet.  Not able to sweat in spite of heat and temperature.  Rapid breathing.  Blue lips.  Confusion and lethargy.  Difficulty being awakened.  Minimal urine production.  No tears. DIAGNOSIS  Your caregiver will diagnose dehydration based on your symptoms and your exam. Blood and urine tests will help confirm the diagnosis. The diagnostic evaluation should also identify the cause of dehydration. TREATMENT  Treatment of mild or moderate dehydration can often be done at home by increasing the amount of fluids that you drink. It is best to drink small amounts of fluid more often. Drinking too much at one time can make vomiting worse. Refer to the home care instructions below. Severe dehydration needs to be treated at the hospital where you will probably be given intravenous (IV) fluids that contain water and electrolytes. HOME CARE INSTRUCTIONS   Ask your caregiver about specific rehydration instructions.  Drink enough fluids to keep your urine clear or pale yellow.  Drink small amounts frequently if you have nausea and vomiting.  Eat as you normally do.  Avoid:  Foods or drinks high in sugar.  Carbonated  drinks.  Juice.  Extremely hot or cold fluids.  Drinks with caffeine.  Fatty, greasy foods.  Alcohol.  Tobacco.  Overeating.  Gelatin desserts.  Wash your hands well to avoid spreading bacteria and viruses.  Only take over-the-counter or prescription medicines for pain, discomfort, or fever as directed by your caregiver.  Ask your caregiver if you should continue all prescribed and over-the-counter medicines.  Keep all follow-up appointments with your caregiver. SEEK MEDICAL CARE IF:  You have abdominal pain and it increases or stays in one area (localizes).  You have a rash, stiff neck, or severe headache.  You are irritable, sleepy, or difficult to awaken.  You are weak, dizzy, or extremely thirsty. SEEK IMMEDIATE MEDICAL CARE IF:   You are unable to keep fluids down or you get worse despite treatment.  You have frequent episodes of vomiting or diarrhea.  You have blood or green matter (bile) in your vomit.  You have blood in your stool or your stool looks black and tarry.  You have not urinated in 6 to 8 hours, or you have only urinated a small amount of very dark urine.  You have a fever.  You faint. MAKE SURE YOU:   Understand these instructions.  Will watch your condition.  Will get help right away if you are not doing well or get worse. Document Released: 01/15/2005 Document Revised: 04/09/2011 Document Reviewed: 09/04/2010 ExitCare Patient Information 2015 [employer]. This information is not intended to replace advice given to you by your health care provider. Make sure you discuss any questions you have with your health care   provider.  

## 2013-09-23 NOTE — Assessment & Plan Note (Addendum)
He has no signs of airway obstruction or stridor.

## 2013-09-23 NOTE — Progress Notes (Signed)
Patient in clinic for IV fluid therapy. Patient denies nausea or vomiting or pain. Patient reminded if he develops nausea or pain to tell nursing as he has standing orders patient verbalized understanding.

## 2013-09-23 NOTE — Assessment & Plan Note (Signed)
His feeding tube site looks okay without signs of infection.

## 2013-09-23 NOTE — Progress Notes (Signed)
09/23/2013  Patient:            Andrew Osborn Date of Birth:  02-12-1955 MRN:                [id number]  BP 114/71  Pulse 73  Temp(Src) 98.2 F (36.8 C) (Oral)  Wt 155 lb (70.308 kg)  Andrew Osborn presents for oral examination during radiation therapy. Patient has completed 5/35 radiation treatments. Patient has had one cycle of chemotherapy.  REVIEW OF CHIEF COMPLAINTS:  DRY MOUTH: Yes, but not too bad. HARD TO SWALLOW: Yes  HURT TO SWALLOW: Yes, at times. TASTE CHANGES: No SORES IN MOUTH: No TRISMUS: No trismus symptoms WEIGHT: 155 pounds  HOME OH REGIMEN:  BRUSHING: Twice a day FLOSSING: None.  RINSING: Using salt water rinses. FLUORIDE: No TRISMUS EXERCISES:  Maximum interincisal opening: 50 mm.   DENTAL EXAM:  Oral Hygiene:(PLAQUE): Plaque and calculus accumulations consistent with chronic periodontitis. LOCATION OF MUCOSITIS: None noted DESCRIPTION OF SALIVA: Incipient xerostomia ANY EXPOSED BONE: None noted OTHER WATCHED AREAS: Chronic apical periodontitis, multiple retained root segments, dental caries with no current evidence of oral abscess formation. Plan was made to to defer dental extractions prior to chemoradiation therapy at the request of the medical team. Plan is for referral to an oral surgeon for surgical procedures 3 months after radiation therapy. The patient currently denies any acute toothache, swellings, or abscesses. DX: Xerostomia, Dysphagia, Odynophagia, Weight Loss, Accretions and Chronic apical periodontitis, dental caries, and multiple retained root segments  RECOMMENDATIONS: 1. Brush after meals and at bedtime.  Floss at bedtime. 2. Use trismus exercises as directed. 3. Use Biotene Rinse or salt water/baking soda rinses. 4. Multiple sips of water as needed. 5. Return to clinic in two months for periodic oral exam after radiation therapy. 6. Call if problems before then with emphasis on toothaches and swellings in the  mouth.Lenn Cal, DDS

## 2013-09-23 NOTE — Assessment & Plan Note (Signed)
I spent some time counseling the patient the importance of tobacco cessation. he is currently attempting to quit on his own 

## 2013-09-23 NOTE — Patient Instructions (Signed)
RECOMMENDATIONS: 1. Brush after meals and at bedtime.  Floss at bedtime. 2. Use trismus exercises as directed. 3. Use Biotene Rinse or salt water/baking soda rinses. 4. Multiple sips of water as needed. 5. Return to clinic in two months for periodic oral exam after radiation therapy. 6. Call if problems before then with emphasis on toothaches and swellings in the mouth.Lenn Cal, DDS

## 2013-09-24 ENCOUNTER — Ambulatory Visit
Admission: RE | Admit: 2013-09-24 | Discharge: 2013-09-24 | Disposition: A | Discharge status: Home / Self Care | Payer: Medicare Other | Source: Ambulatory Visit | Attending: Radiation Oncology | Admitting: Radiation Oncology

## 2013-09-24 ENCOUNTER — Other Ambulatory Visit: Payer: Self-pay | Admitting: *Deleted

## 2013-09-24 ENCOUNTER — Telehealth: Payer: Self-pay | Admitting: *Deleted

## 2013-09-24 ENCOUNTER — Encounter: Payer: Self-pay | Admitting: *Deleted

## 2013-09-24 ENCOUNTER — Ambulatory Visit (HOSPITAL_BASED_OUTPATIENT_CLINIC_OR_DEPARTMENT_OTHER): Payer: Medicare Other

## 2013-09-24 VITALS — BP 116/64 | HR 76 | Temp 98.8°F | Resp 20

## 2013-09-24 DIAGNOSIS — C321 Malignant neoplasm of supraglottis: Secondary | ICD-10-CM

## 2013-09-24 DIAGNOSIS — Z510 Encounter for antineoplastic radiation therapy: Secondary | ICD-10-CM | POA: Diagnosis not present

## 2013-09-24 MED ORDER — SODIUM CHLORIDE 0.9 % IJ SOLN
10.0000 mL | INTRAMUSCULAR | Status: DC | PRN
  Administered 2013-09-24: 10 mL
  Filled 2013-09-24: qty 10

## 2013-09-24 MED ORDER — ONDANSETRON 8 MG/50ML IVPB (CHCC)
8.0000 mg | Freq: Once | INTRAVENOUS | Status: AC
  Administered 2013-09-24: 8 mg via INTRAVENOUS

## 2013-09-24 MED ORDER — ONDANSETRON 8 MG/NS 50 ML IVPB
INTRAVENOUS | Status: AC
  Filled 2013-09-24: qty 8

## 2013-09-24 MED ORDER — HYDROMORPHONE HCL PF 1 MG/ML IJ SOLN
1.0000 mg | Freq: Every day | INTRAMUSCULAR | Status: DC
  Filled 2013-09-24: qty 1

## 2013-09-24 MED ORDER — HEPARIN SOD (PORK) LOCK FLUSH 100 UNIT/ML IV SOLN
500.0000 [IU] | Freq: Once | INTRAVENOUS | Status: AC | PRN
  Administered 2013-09-24: 500 [IU]
  Filled 2013-09-24: qty 5

## 2013-09-24 MED ORDER — PROMETHAZINE HCL 25 MG/ML IJ SOLN
25.0000 mg | Freq: Once | INTRAMUSCULAR | Status: DC
  Filled 2013-09-24: qty 1

## 2013-09-24 MED ORDER — SODIUM CHLORIDE 0.9 % IV SOLN
Freq: Once | INTRAVENOUS | Status: AC
  Administered 2013-09-24: 09:00:00 via INTRAVENOUS

## 2013-09-24 NOTE — Progress Notes (Signed)
Met with patient in Infusion to provide support and encouragement.  He is tolerating procedure well, enjoying snacks and watching TV.  He did not express any concerns or complaints.  Continuing navigation as L1 patient (new patient).  Gayleen Orem, RN, BSN, [place] at [place] [phone]

## 2013-09-24 NOTE — Progress Notes (Addendum)
Met with patient in Infusion to provide support and encouragement.  He reported his PEG is being flushed daily by staff at [facility], he reported that he is not taking Lisinopril any longer.  I confirmed with his RN her awareness of phone # to call when he was finished with IVF and ready for return transportation to [place].  Gayleen Orem, RN, BSN, [place] at [place] [phone]

## 2013-09-24 NOTE — Telephone Encounter (Signed)
Entry error

## 2013-09-24 NOTE — Progress Notes (Signed)
Pt denies nausea or pain at this time. Pt informed if he develops either to let RN know and we can get him some pain or nausea medication per standing prn orders.  Pt verbalized understanding.

## 2013-09-24 NOTE — Telephone Encounter (Signed)
Confirmed with patient's nurse mgr that he is not receiving Lisinopril.  Gayleen Orem, RN, BSN, [place] at [place] [phone]

## 2013-09-24 NOTE — Progress Notes (Signed)
To provide support, encouragement and care continuity, met with patient during weekly [place] appt with Dr. Isidore Moos.  He reports that he his smoking 6-7 cigarettes/day; we discussed his trying to reduce to 5/day by the end of the week, he expressed agreement.  Per Dr. Isidore Moos, I will follow-up with [facility] and communicate order to stop lisinopril and with Dr. Alvy Bimler for evaluation of dehydration during his appt this Wednesday.  Gayleen Orem, RN, BSN, [place] at [place] [phone]

## 2013-09-24 NOTE — Patient Instructions (Signed)
Dehydration, Adult Dehydration is when you lose more fluids from the body than you take in. Vital organs like the kidneys, brain, and heart cannot function without a proper amount of fluids and salt. Any loss of fluids from the body can cause dehydration.  CAUSES   Vomiting.  Diarrhea.  Excessive sweating.  Excessive urine output.  Fever. SYMPTOMS  Mild dehydration  Thirst.  Dry lips.  Slightly dry mouth. Moderate dehydration  Very dry mouth.  Sunken eyes.  Skin does not bounce back quickly when lightly pinched and released.  Dark urine and decreased urine production.  Decreased tear production.  Headache. Severe dehydration  Very dry mouth.  Extreme thirst.  Rapid, weak pulse (more than 100 beats per minute at rest).  Cold hands and feet.  Not able to sweat in spite of heat and temperature.  Rapid breathing.  Blue lips.  Confusion and lethargy.  Difficulty being awakened.  Minimal urine production.  No tears. DIAGNOSIS  Your caregiver will diagnose dehydration based on your symptoms and your exam. Blood and urine tests will help confirm the diagnosis. The diagnostic evaluation should also identify the cause of dehydration. TREATMENT  Treatment of mild or moderate dehydration can often be done at home by increasing the amount of fluids that you drink. It is best to drink small amounts of fluid more often. Drinking too much at one time can make vomiting worse. Refer to the home care instructions below. Severe dehydration needs to be treated at the hospital where you will probably be given intravenous (IV) fluids that contain water and electrolytes. HOME CARE INSTRUCTIONS   Ask your caregiver about specific rehydration instructions.  Drink enough fluids to keep your urine clear or pale yellow.  Drink small amounts frequently if you have nausea and vomiting.  Eat as you normally do.  Avoid:  Foods or drinks high in sugar.  Carbonated  drinks.  Juice.  Extremely hot or cold fluids.  Drinks with caffeine.  Fatty, greasy foods.  Alcohol.  Tobacco.  Overeating.  Gelatin desserts.  Wash your hands well to avoid spreading bacteria and viruses.  Only take over-the-counter or prescription medicines for pain, discomfort, or fever as directed by your caregiver.  Ask your caregiver if you should continue all prescribed and over-the-counter medicines.  Keep all follow-up appointments with your caregiver. SEEK MEDICAL CARE IF:  You have abdominal pain and it increases or stays in one area (localizes).  You have a rash, stiff neck, or severe headache.  You are irritable, sleepy, or difficult to awaken.  You are weak, dizzy, or extremely thirsty. SEEK IMMEDIATE MEDICAL CARE IF:   You are unable to keep fluids down or you get worse despite treatment.  You have frequent episodes of vomiting or diarrhea.  You have blood or green matter (bile) in your vomit.  You have blood in your stool or your stool looks black and tarry.  You have not urinated in 6 to 8 hours, or you have only urinated a small amount of very dark urine.  You have a fever.  You faint. MAKE SURE YOU:   Understand these instructions.  Will watch your condition.  Will get help right away if you are not doing well or get worse. Document Released: 01/15/2005 Document Revised: 04/09/2011 Document Reviewed: 09/04/2010 ExitCare Patient Information 2015 [employer]. This information is not intended to replace advice given to you by your health care provider. Make sure you discuss any questions you have with your health care   provider.  

## 2013-09-25 ENCOUNTER — Ambulatory Visit
Admission: RE | Admit: 2013-09-25 | Discharge: 2013-09-25 | Disposition: A | Discharge status: Home / Self Care | Payer: Medicare Other | Source: Ambulatory Visit | Attending: Radiation Oncology | Admitting: Radiation Oncology

## 2013-09-25 ENCOUNTER — Ambulatory Visit (HOSPITAL_BASED_OUTPATIENT_CLINIC_OR_DEPARTMENT_OTHER): Payer: Medicare Other

## 2013-09-25 VITALS — BP 109/63 | HR 75 | Temp 98.1°F | Resp 18

## 2013-09-25 DIAGNOSIS — Z510 Encounter for antineoplastic radiation therapy: Secondary | ICD-10-CM | POA: Diagnosis not present

## 2013-09-25 DIAGNOSIS — C321 Malignant neoplasm of supraglottis: Secondary | ICD-10-CM

## 2013-09-25 MED ORDER — HEPARIN SOD (PORK) LOCK FLUSH 100 UNIT/ML IV SOLN
500.0000 [IU] | Freq: Once | INTRAVENOUS | Status: AC | PRN
  Administered 2013-09-25: 500 [IU]
  Filled 2013-09-25: qty 5

## 2013-09-25 MED ORDER — SODIUM CHLORIDE 0.9 % IJ SOLN
10.0000 mL | INTRAMUSCULAR | Status: DC | PRN
  Administered 2013-09-25: 10 mL
  Filled 2013-09-25: qty 10

## 2013-09-25 MED ORDER — LIDOCAINE-PRILOCAINE 2.5-2.5 % EX CREA
TOPICAL_CREAM | CUTANEOUS | Status: AC
Start: 2013-09-25 — End: 2013-09-25
  Filled 2013-09-25: qty 5

## 2013-09-25 MED ORDER — ONDANSETRON 8 MG/50ML IVPB (CHCC)
8.0000 mg | Freq: Once | INTRAVENOUS | Status: AC
  Administered 2013-09-25: 8 mg via INTRAVENOUS

## 2013-09-25 MED ORDER — ONDANSETRON 8 MG/NS 50 ML IVPB
INTRAVENOUS | Status: AC
  Filled 2013-09-25: qty 8

## 2013-09-25 MED ORDER — SODIUM CHLORIDE 0.9 % IV SOLN
1000.0000 mL | Freq: Once | INTRAVENOUS | Status: AC
  Administered 2013-09-25: 1000 mL via INTRAVENOUS

## 2013-09-25 NOTE — Patient Instructions (Signed)
Dehydration, Adult Dehydration is when you lose more fluids from the body than you take in. Vital organs like the kidneys, brain, and heart cannot function without a proper amount of fluids and salt. Any loss of fluids from the body can cause dehydration.  CAUSES   Vomiting.  Diarrhea.  Excessive sweating.  Excessive urine output.  Fever. SYMPTOMS  Mild dehydration  Thirst.  Dry lips.  Slightly dry mouth. Moderate dehydration  Very dry mouth.  Sunken eyes.  Skin does not bounce back quickly when lightly pinched and released.  Dark urine and decreased urine production.  Decreased tear production.  Headache. Severe dehydration  Very dry mouth.  Extreme thirst.  Rapid, weak pulse (more than 100 beats per minute at rest).  Cold hands and feet.  Not able to sweat in spite of heat and temperature.  Rapid breathing.  Blue lips.  Confusion and lethargy.  Difficulty being awakened.  Minimal urine production.  No tears. DIAGNOSIS  Your caregiver will diagnose dehydration based on your symptoms and your exam. Blood and urine tests will help confirm the diagnosis. The diagnostic evaluation should also identify the cause of dehydration. TREATMENT  Treatment of mild or moderate dehydration can often be done at home by increasing the amount of fluids that you drink. It is best to drink small amounts of fluid more often. Drinking too much at one time can make vomiting worse. Refer to the home care instructions below. Severe dehydration needs to be treated at the hospital where you will probably be given intravenous (IV) fluids that contain water and electrolytes. HOME CARE INSTRUCTIONS   Ask your caregiver about specific rehydration instructions.  Drink enough fluids to keep your urine clear or pale yellow.  Drink small amounts frequently if you have nausea and vomiting.  Eat as you normally do.  Avoid:  Foods or drinks high in sugar.  Carbonated  drinks.  Juice.  Extremely hot or cold fluids.  Drinks with caffeine.  Fatty, greasy foods.  Alcohol.  Tobacco.  Overeating.  Gelatin desserts.  Wash your hands well to avoid spreading bacteria and viruses.  Only take over-the-counter or prescription medicines for pain, discomfort, or fever as directed by your caregiver.  Ask your caregiver if you should continue all prescribed and over-the-counter medicines.  Keep all follow-up appointments with your caregiver. SEEK MEDICAL CARE IF:  You have abdominal pain and it increases or stays in one area (localizes).  You have a rash, stiff neck, or severe headache.  You are irritable, sleepy, or difficult to awaken.  You are weak, dizzy, or extremely thirsty. SEEK IMMEDIATE MEDICAL CARE IF:   You are unable to keep fluids down or you get worse despite treatment.  You have frequent episodes of vomiting or diarrhea.  You have blood or green matter (bile) in your vomit.  You have blood in your stool or your stool looks black and tarry.  You have not urinated in 6 to 8 hours, or you have only urinated a small amount of very dark urine.  You have a fever.  You faint. MAKE SURE YOU:   Understand these instructions.  Will watch your condition.  Will get help right away if you are not doing well or get worse. Document Released: 01/15/2005 Document Revised: 04/09/2011 Document Reviewed: 09/04/2010 ExitCare Patient Information 2015 [employer]. This information is not intended to replace advice given to you by your health care provider. Make sure you discuss any questions you have with your health care   provider.  

## 2013-09-28 ENCOUNTER — Ambulatory Visit: Admission: RE | Admit: 2013-09-28 | Payer: Medicare Other | Source: Ambulatory Visit | Admitting: Radiation Oncology

## 2013-09-28 ENCOUNTER — Telehealth: Payer: Self-pay | Admitting: *Deleted

## 2013-09-28 ENCOUNTER — Ambulatory Visit: Payer: Medicare Other

## 2013-09-28 NOTE — Telephone Encounter (Signed)
In follow-up to patient's missed RT appt, called [facility] @ ca. 11:00 am, spoke with caregiver Andrew Osborn.  She reported that Andrew Osborn had been feeling nauseous this morning and did not want to come in for tmt.  I indicated the [facility] therapists could treat him around 3:20 if he was able to come in; [place] stated that there is no one who can provide transportation at that time.  I emphasized the importance of keeping RT appts, she acknowledged understanding.  I stated I would call again in about and hour to check on his status. I called @ 12:29, spoke with caregiver Andrew Osborn who reported that Andrew Osborn was feeling better s/p antiemetic.  I indicated to her that RT therapists could provide his tmt as soon as they could bring him.  Andrew Osborn replied that there is no one who can provide transportation after 12:00 noon today.  She indicated he would keep tomorrow's appt.  Patient.  Andrew Osborn and Dr. Isidore Moos notified.  Gayleen Orem, RN, BSN, [place] at [place] [phone]

## 2013-09-29 ENCOUNTER — Ambulatory Visit
Admission: RE | Admit: 2013-09-29 | Discharge: 2013-09-29 | Disposition: A | Discharge status: Home / Self Care | Payer: Medicare Other | Source: Ambulatory Visit | Attending: Radiation Oncology | Admitting: Radiation Oncology

## 2013-09-29 ENCOUNTER — Encounter: Payer: Self-pay | Admitting: Radiation Oncology

## 2013-09-29 VITALS — BP 128/82 | HR 91 | Temp 98.1°F | Resp 20 | Wt 151.1 lb

## 2013-09-29 DIAGNOSIS — C321 Malignant neoplasm of supraglottis: Secondary | ICD-10-CM

## 2013-09-29 DIAGNOSIS — Z510 Encounter for antineoplastic radiation therapy: Secondary | ICD-10-CM | POA: Diagnosis not present

## 2013-09-29 NOTE — Progress Notes (Addendum)
Patient is a resident at [facility]. Patient states he did not come in for radiation treatment yesterday due to n/v. He states he gagged and vomited x 1. He states he took medication for nausea and felt much better, but he does not remember whether he took Zofran or Phenergan. Pt states he had "acid reflux" yesterday as well which contributed to his nausea. He states his appetite is good, denies pain other than a small open sore on his lower left lip which he states "is getting better". Gave him Aquaphor and instructed not to apply until after radiation treatment each day. Pt denies fatigue, difficulty eating, swallowing. He states he continues to smoke 1/2 PPD, encouraged smoking cessation.

## 2013-09-29 NOTE — Progress Notes (Signed)
Weekly Management Note:  Site: Larynx/neck Current Dose:  1600  cGy Projected Dose: 7000  cGy  Narrative: The patient is seen today for routine under treatment assessment. CBCT/MVCT images/port films were reviewed. The chart was reviewed.   He missed his today's treatment because of "gagging and vomiting" he finishes first course of chemotherapy last week with his first week of radiation therapy. He tells me that he feels "great" today. He is getting all of his nutrition by mouth. He plans to using his PEG tube later this week or next week. His next chemotherapy is week of September 10. He denies pain/odynophagia.  Physical Examination:  Filed Vitals:   09/29/13 0827  BP: 128/82  Pulse: 91  Temp: 98.1 F (36.7 C)  Resp: 20  .  Weight: 151 lb 1.6 oz (68.539 kg). Oral cavity and oropharynx are without candidiasis.  Impression: Tolerating radiation therapy well. I'm not sure if he had acid reflux yesterday precipitating his gagging or if this is residual from his chemotherapy last week. Regardless, he feels well today. I instructed him to stay hydrated, and to stop smoking.  Plan: Continue radiation therapy as planned.

## 2013-09-30 ENCOUNTER — Other Ambulatory Visit: Payer: Self-pay

## 2013-09-30 ENCOUNTER — Ambulatory Visit: Payer: Self-pay | Admitting: Hematology and Oncology

## 2013-09-30 ENCOUNTER — Ambulatory Visit
Admission: RE | Admit: 2013-09-30 | Discharge: 2013-09-30 | Disposition: A | Discharge status: Home / Self Care | Payer: Medicare Other | Source: Ambulatory Visit | Attending: Radiation Oncology | Admitting: Radiation Oncology

## 2013-09-30 DIAGNOSIS — Z510 Encounter for antineoplastic radiation therapy: Secondary | ICD-10-CM | POA: Diagnosis not present

## 2013-10-01 ENCOUNTER — Ambulatory Visit (HOSPITAL_BASED_OUTPATIENT_CLINIC_OR_DEPARTMENT_OTHER): Payer: Medicare Other

## 2013-10-01 ENCOUNTER — Encounter: Payer: Self-pay | Admitting: *Deleted

## 2013-10-01 ENCOUNTER — Ambulatory Visit
Admission: RE | Admit: 2013-10-01 | Discharge: 2013-10-01 | Disposition: A | Discharge status: Home / Self Care | Payer: Medicare Other | Source: Ambulatory Visit | Attending: Radiation Oncology | Admitting: Radiation Oncology

## 2013-10-01 ENCOUNTER — Telehealth: Payer: Self-pay | Admitting: *Deleted

## 2013-10-01 DIAGNOSIS — Z510 Encounter for antineoplastic radiation therapy: Secondary | ICD-10-CM | POA: Diagnosis not present

## 2013-10-01 DIAGNOSIS — C321 Malignant neoplasm of supraglottis: Secondary | ICD-10-CM

## 2013-10-01 LAB — CBC WITH DIFFERENTIAL/PLATELET
BASO%: 0.4 % (ref 0.0–2.0)
Basophils Absolute: 0 10*3/uL (ref 0.0–0.1)
EOS%: 5.8 % (ref 0.0–7.0)
Eosinophils Absolute: 0.3 10*3/uL (ref 0.0–0.5)
HEMATOCRIT: 32.8 % — AB (ref 38.4–49.9)
HGB: 11.1 g/dL — ABNORMAL LOW (ref 13.0–17.1)
LYMPH#: 0.7 10*3/uL — AB (ref 0.9–3.3)
LYMPH%: 16.4 % (ref 14.0–49.0)
MCH: 30.4 pg (ref 27.2–33.4)
MCHC: 33.8 g/dL (ref 32.0–36.0)
MCV: 89.9 fL (ref 79.3–98.0)
MONO#: 0.5 10*3/uL (ref 0.1–0.9)
MONO%: 12.2 % (ref 0.0–14.0)
NEUT#: 2.9 10*3/uL (ref 1.5–6.5)
NEUT%: 65.2 % (ref 39.0–75.0)
Platelets: 202 10*3/uL (ref 140–400)
RBC: 3.64 10*6/uL — ABNORMAL LOW (ref 4.20–5.82)
RDW: 14.3 % (ref 11.0–14.6)
WBC: 4.4 10*3/uL (ref 4.0–10.3)

## 2013-10-01 LAB — COMPREHENSIVE METABOLIC PANEL (CC13)
ALT: 10 U/L (ref 0–55)
ANION GAP: 6 meq/L (ref 3–11)
AST: 12 U/L (ref 5–34)
Albumin: 2.8 g/dL — ABNORMAL LOW (ref 3.5–5.0)
Alkaline Phosphatase: 68 U/L (ref 40–150)
BUN: 12.6 mg/dL (ref 7.0–26.0)
CALCIUM: 9.7 mg/dL (ref 8.4–10.4)
CHLORIDE: 98 meq/L (ref 98–109)
CO2: 31 meq/L — AB (ref 22–29)
CREATININE: 0.7 mg/dL (ref 0.7–1.3)
Glucose: 106 mg/dl (ref 70–140)
Potassium: 4.1 mEq/L (ref 3.5–5.1)
Sodium: 136 mEq/L (ref 136–145)
Total Bilirubin: <0.2 mg/dL (ref 0.20–1.20)
Total Protein: 6.1 g/dL — ABNORMAL LOW (ref 6.4–8.3)

## 2013-10-01 LAB — MAGNESIUM (CC13): Magnesium: 1.9 mg/dl (ref 1.5–2.5)

## 2013-10-01 NOTE — Progress Notes (Signed)
Met with patient's transporter, Ivin Booty, while he was receiving his RT.  She explained that a different individual brought Andrew Osborn for his RT yesterday and was not aware of the labs and appt with Dr. Alvy Bimler.  We discussed steps she will take to insure that future appts are not missed when she does not bring him.  To that end, I provided her an Epic calendar with upcoming appts.  I arranged for Andrew Osborn to have labs drawn after today's RT.  I explained to Ivin Booty that a make-up appt with Dr. Alvy Bimler is pending.  Gayleen Orem, RN, BSN, [place] at [place] [phone]

## 2013-10-01 NOTE — Telephone Encounter (Signed)
Spoke with Andrew Osborn at [place] to inform that Andrew Osborn has an 0830 appt tomorrow to see Dr. Alvy Bimler.  She indicated she would communicate this to his transporter, Andrew Osborn.  Andrew Orem, Andrew Osborn, Andrew Osborn, [place] at [place] [phone]

## 2013-10-02 ENCOUNTER — Ambulatory Visit (HOSPITAL_BASED_OUTPATIENT_CLINIC_OR_DEPARTMENT_OTHER): Payer: Medicare Other | Admitting: Hematology and Oncology

## 2013-10-02 ENCOUNTER — Encounter: Payer: Self-pay | Admitting: Hematology and Oncology

## 2013-10-02 ENCOUNTER — Telehealth: Payer: Self-pay | Admitting: Hematology and Oncology

## 2013-10-02 ENCOUNTER — Ambulatory Visit
Admission: RE | Admit: 2013-10-02 | Discharge: 2013-10-02 | Disposition: A | Discharge status: Home / Self Care | Payer: Medicare Other | Source: Ambulatory Visit | Attending: Radiation Oncology | Admitting: Radiation Oncology

## 2013-10-02 VITALS — BP 125/80 | HR 86 | Temp 97.9°F | Resp 20 | Ht 70.5 in | Wt 150.8 lb

## 2013-10-02 DIAGNOSIS — Z510 Encounter for antineoplastic radiation therapy: Secondary | ICD-10-CM | POA: Diagnosis not present

## 2013-10-02 DIAGNOSIS — J988 Other specified respiratory disorders: Secondary | ICD-10-CM

## 2013-10-02 DIAGNOSIS — E46 Unspecified protein-calorie malnutrition: Secondary | ICD-10-CM

## 2013-10-02 DIAGNOSIS — F17200 Nicotine dependence, unspecified, uncomplicated: Secondary | ICD-10-CM

## 2013-10-02 DIAGNOSIS — I10 Essential (primary) hypertension: Secondary | ICD-10-CM

## 2013-10-02 DIAGNOSIS — Z931 Gastrostomy status: Secondary | ICD-10-CM

## 2013-10-02 DIAGNOSIS — R070 Pain in throat: Secondary | ICD-10-CM

## 2013-10-02 DIAGNOSIS — D638 Anemia in other chronic diseases classified elsewhere: Secondary | ICD-10-CM

## 2013-10-02 DIAGNOSIS — Z23 Encounter for immunization: Secondary | ICD-10-CM

## 2013-10-02 DIAGNOSIS — C321 Malignant neoplasm of supraglottis: Secondary | ICD-10-CM

## 2013-10-02 DIAGNOSIS — Z720 Tobacco use: Secondary | ICD-10-CM

## 2013-10-02 MED ORDER — INFLUENZA VAC SPLIT QUAD 0.5 ML IM SUSY
0.5000 mL | PREFILLED_SYRINGE | Freq: Once | INTRAMUSCULAR | Status: AC
  Administered 2013-10-02: 0.5 mL via INTRAMUSCULAR
  Filled 2013-10-02: qty 0.5

## 2013-10-02 NOTE — Assessment & Plan Note (Signed)
This has not been an issue. Continue close observation.

## 2013-10-02 NOTE — Assessment & Plan Note (Signed)
His blood pressure is now normal. Continue to hold taking lisinopril.

## 2013-10-02 NOTE — Telephone Encounter (Signed)
added pt appt per pof....per pof pt aware.

## 2013-10-02 NOTE — Assessment & Plan Note (Signed)
His feeding tube site looks okay without signs of infection.

## 2013-10-02 NOTE — Assessment & Plan Note (Signed)
He has 2 pound weight loss in one week. I suspect the majority of the weight loss is due to dehydration. The patient is able to swallow food and I encourage him to eat as much as he can.

## 2013-10-02 NOTE — Assessment & Plan Note (Signed)
I gave him prescription hydromorphone. I want to avoid Tylenol-containing products if possible. We discussed narcotics refill policy. He is not sure about his pain medication. He denies pain.

## 2013-10-02 NOTE — Assessment & Plan Note (Signed)
I spent some time counseling the patient the importance of tobacco cessation. He is currently attempting to quit now.

## 2013-10-02 NOTE — Assessment & Plan Note (Signed)
This is likely anemia of chronic disease. The patient denies recent history of bleeding such as epistaxis, hematuria or hematochezia. He is asymptomatic from the anemia. We will observe for now.  

## 2013-10-02 NOTE — Progress Notes (Signed)
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   08/13/2013 Procedure Laryngoscopy showed the right false vocal fold and the right and midline laryngeal surface of the epiglottis showed a fungating, irregular mass   08/26/2013 Imaging PET scan showed right supraglottic mass with metastatic bilateral level II and III lymph nodes. Resultant severe narrowing of the airway at or just above the vocal folds.    09/15/2013 Procedure He has placement of feeding tube and port.   09/16/2013 -  Chemotherapy He started on high dose cisplatin chemotherapy with radiation   09/16/2013 -  Radiation Therapy He started on daily radiation therapy    INTERVAL HISTORY: Please see below for problem oriented charting. He is seen prior to cycle 2 of treatment and his weekly supportive care followup.  REVIEW OF SYSTEMS:   Constitutional: Denies fevers, chills or abnormal weight loss Eyes: Denies blurriness of vision Ears, nose, mouth, throat, and face: Denies mucositis or sore throat Respiratory: Denies cough, dyspnea or wheezes Cardiovascular: Denies palpitation, chest discomfort or lower extremity swelling Gastrointestinal:  Denies nausea, heartburn or change in bowel habits Skin: Denies abnormal skin rashes Lymphatics: Denies new lymphadenopathy or easy bruising Neurological:Denies  numbness, tingling or new weaknesses Behavioral/Psych: Mood is stable, no new changes  All other systems were reviewed with the patient and are negative.  I have reviewed the past medical history, past surgical history, social history and family history with the patient and they are unchanged from previous note.  ALLERGIES:  has No Known Allergies.  MEDICATIONS:  Current Outpatient Prescriptions  Medication Sig Dispense Refill  . acetaminophen (TYLENOL) 325 MG tablet Take 650 mg by mouth every 6 (six) hours as needed (pain).      Marland Kitchen aspirin EC 81 MG tablet Take 81 mg by mouth daily.      . cholecalciferol (VITAMIN D) 400 UNITS TABS tablet Take 800 Units by mouth daily.      . cycloSPORINE (RESTASIS) 0.05 % ophthalmic emulsion Place 1 drop into both eyes 2 (two) times daily.      Marland Kitchen emollient (BIAFINE) cream Apply topically 2 (two) times daily.      Marland Kitchen guaifenesin (ROBITUSSIN) 100 MG/5ML syrup Take 200 mg by mouth 3 (three) times daily as needed for cough.      . hydrocerin (EUCERIN) CREA Apply 1 application topically 2 (two) times daily. Spread to bilateral palm twice daily      . HYDROCODONE-ACETAMINOPHEN PO Take 15 mLs by mouth every 4 (four) hours as needed (Hydorcodone 7.5/325mg /26ml oral solution.Pain).       Marland Kitchen HYDROmorphone (DILAUDID) 2 MG tablet Take 0.5 tablets (1 mg total) by mouth every 4 (four) hours as needed for moderate pain or severe pain.  30 tablet  0  . lidocaine-prilocaine (EMLA) cream Apply 1 application topically as needed.  30 g  6  . ondansetron (  ZOFRAN) 4 MG tablet Take 4 mg by mouth 3 (three) times daily as needed for nausea or vomiting.       Marland Kitchen OVER THE COUNTER MEDICATION Take 237 mLs by mouth 3 (three) times daily. [employer]      . polyethylene glycol (MIRALAX) packet Take 17 g by mouth daily.  14 each  0  . promethazine (PHENERGAN) 25 MG tablet Take 1 tablet (25 mg total) by mouth every 6 (six) hours as needed for nausea.  60 tablet  3  . risperiDONE (RISPERDAL)  2 MG tablet Take 2 mg by mouth 2 (two) times daily.      . simvastatin (ZOCOR) 10 MG tablet Take 10 mg by mouth at bedtime.       No current facility-administered medications for this visit.    PHYSICAL EXAMINATION: ECOG PERFORMANCE STATUS: 0 - Asymptomatic  Filed Vitals:   10/02/13 0836  BP: 125/80  Pulse: 86  Temp: 97.9 F (36.6 C)  Resp: 20   Filed Weights   10/02/13 0836  Weight: 150 lb 12.8 oz (68.402 kg)    GENERAL:alert, no distress and comfortable SKIN: skin color, texture, turgor are normal, no rashes or significant lesions EYES: normal, Conjunctiva are pink and non-injected, sclera clear OROPHARYNX:no exudate, no erythema and lips, buccal mucosa, and tongue normal . Poor dentition is noted NECK: supple, thyroid normal size, non-tender, without nodularity LYMPH:  no palpable lymphadenopathy in the cervical, axillary or inguinal LUNGS: clear to auscultation and percussion with normal breathing effort HEART: regular rate & rhythm and no murmurs and no lower extremity edema ABDOMEN:abdomen soft, non-tender and normal bowel sounds. Feeding tube site looks okay Musculoskeletal:no cyanosis of digits and no clubbing  NEURO: alert & oriented x 3 with fluent speech, no focal motor/sensory deficits  LABORATORY DATA:  I have reviewed the data as listed    Component Value Date/Time   NA 136 10/01/2013 0843   NA 137 09/15/2013 0940   K 4.1 10/01/2013 0843   K 3.9 09/15/2013 0940   CL 99 09/15/2013 0940   CO2 31* 10/01/2013 0843   CO2 28 09/15/2013 0940   GLUCOSE 106 10/01/2013 0843   GLUCOSE 112* 09/15/2013 0940   BUN 12.6 10/01/2013 0843   BUN 9 09/15/2013 0940   CREATININE 0.7 10/01/2013 0843   CREATININE 0.62 09/15/2013 0940   CALCIUM 9.7 10/01/2013 0843   CALCIUM 10.0 09/15/2013 0940   PROT 6.1* 10/01/2013 0843   PROT 6.5 09/07/2013 1430   ALBUMIN 2.8* 10/01/2013 0843   ALBUMIN 3.3* 09/07/2013 1430   AST 12 10/01/2013 0843   AST 11 09/07/2013 1430   ALT 10 10/01/2013 0843   ALT 7 09/07/2013  1430   ALKPHOS 68 10/01/2013 0843   ALKPHOS 92 09/07/2013 1430   BILITOT <0.20 10/01/2013 0843   BILITOT <0.2* 09/07/2013 1430   GFRNONAA >90 09/15/2013 0940   GFRAA >90 09/15/2013 0940    No results found for this basename: SPEP, UPEP,  kappa and lambda light chains    Lab Results  Component Value Date   WBC 4.4 10/01/2013   NEUTROABS 2.9 10/01/2013   HGB 11.1* 10/01/2013   HCT 32.8* 10/01/2013   MCV 89.9 10/01/2013   PLT 202 10/01/2013      Chemistry      Component Value Date/Time   NA 136 10/01/2013 0843   NA 137 09/15/2013 0940   K 4.1 10/01/2013 0843   K 3.9 09/15/2013 0940   CL 99 09/15/2013 0940  CO2 31* 10/01/2013 0843   CO2 28 09/15/2013 0940   BUN 12.6 10/01/2013 0843   BUN 9 09/15/2013 0940   CREATININE 0.7 10/01/2013 0843   CREATININE 0.62 09/15/2013 0940      Component Value Date/Time   CALCIUM 9.7 10/01/2013 0843   CALCIUM 10.0 09/15/2013 0940   ALKPHOS 68 10/01/2013 0843   ALKPHOS 92 09/07/2013 1430   AST 12 10/01/2013 0843   AST 11 09/07/2013 1430   ALT 10 10/01/2013 0843   ALT 7 09/07/2013 1430   BILITOT <0.20 10/01/2013 0843   BILITOT <0.2* 09/07/2013 1430      ASSESSMENT & PLAN:  Malignant neoplasm of supraglottis He tolerated cycle 2 of treatment fairly well. I will proceed with cycle 3 of treatment next week without dose adjustment.  Tobacco abuse I spent some time counseling the patient the importance of tobacco cessation. He is currently attempting to quit now.   Anemia of other chronic disease This is likely anemia of chronic disease. The patient denies recent history of bleeding such as epistaxis, hematuria or hematochezia. He is asymptomatic from the anemia. We will observe for now.      Hypertension His blood pressure is now normal. Continue to hold taking lisinopril.  Narrowing of airway This has not been an issue. Continue close observation.  Throat pain I gave him prescription hydromorphone. I want to avoid Tylenol-containing products if possible. We discussed  narcotics refill policy. He is not sure about his pain medication. He denies pain.     S/P gastrostomy His feeding tube site looks okay without signs of infection.      Protein calorie malnutrition He has 2 pound weight loss in one week. I suspect the majority of the weight loss is due to dehydration. The patient is able to swallow food and I encourage him to eat as much as he can.  We discussed the importance of preventive care and reviewed the vaccination programs. He does not have any prior allergic reactions to influenza vaccination. He agrees to proceed with influenza vaccination today and we will administer it today at the clinic.   No orders of the defined types were placed in this encounter.   All questions were answered. The patient knows to call the clinic with any problems, questions or concerns. No barriers to learning was detected. I spent 30 minutes counseling the patient face to face. The total time spent in the appointment was 40 minutes and more than 50% was on counseling and review of test results     [facility], [place], MD 10/02/2013 9:14 AM

## 2013-10-02 NOTE — Assessment & Plan Note (Signed)
He tolerated cycle 2 of treatment fairly well. I will proceed with cycle 3 of treatment next week without dose adjustment.

## 2013-10-06 ENCOUNTER — Telehealth: Payer: Self-pay | Admitting: *Deleted

## 2013-10-06 ENCOUNTER — Ambulatory Visit
Admission: RE | Admit: 2013-10-06 | Discharge: 2013-10-06 | Disposition: A | Discharge status: Home / Self Care | Payer: Medicare Other | Source: Ambulatory Visit | Attending: Radiation Oncology | Admitting: Radiation Oncology

## 2013-10-06 DIAGNOSIS — Z510 Encounter for antineoplastic radiation therapy: Secondary | ICD-10-CM | POA: Diagnosis not present

## 2013-10-06 NOTE — Telephone Encounter (Signed)
Per desk RN I have added lab appt for tomorrow

## 2013-10-07 ENCOUNTER — Ambulatory Visit: Payer: Medicare Other | Admitting: Nutrition

## 2013-10-07 ENCOUNTER — Encounter: Payer: Self-pay | Admitting: Radiation Oncology

## 2013-10-07 ENCOUNTER — Ambulatory Visit: Payer: Medicare Other | Admitting: Radiation Oncology

## 2013-10-07 ENCOUNTER — Telehealth: Payer: Self-pay | Admitting: Hematology and Oncology

## 2013-10-07 ENCOUNTER — Encounter: Payer: Self-pay | Admitting: Hematology and Oncology

## 2013-10-07 ENCOUNTER — Ambulatory Visit
Admission: RE | Admit: 2013-10-07 | Discharge: 2013-10-07 | Disposition: A | Discharge status: Home / Self Care | Payer: Medicare Other | Source: Ambulatory Visit | Attending: Radiation Oncology | Admitting: Radiation Oncology

## 2013-10-07 ENCOUNTER — Other Ambulatory Visit (HOSPITAL_BASED_OUTPATIENT_CLINIC_OR_DEPARTMENT_OTHER): Payer: Medicare Other

## 2013-10-07 ENCOUNTER — Ambulatory Visit (HOSPITAL_BASED_OUTPATIENT_CLINIC_OR_DEPARTMENT_OTHER): Payer: Medicare Other | Admitting: Hematology and Oncology

## 2013-10-07 ENCOUNTER — Ambulatory Visit (HOSPITAL_BASED_OUTPATIENT_CLINIC_OR_DEPARTMENT_OTHER): Payer: Medicare Other

## 2013-10-07 VITALS — BP 115/67 | HR 76 | Temp 98.4°F | Resp 20 | Wt 152.8 lb

## 2013-10-07 VITALS — BP 122/80 | HR 76 | Temp 97.8°F | Resp 20

## 2013-10-07 DIAGNOSIS — C321 Malignant neoplasm of supraglottis: Secondary | ICD-10-CM

## 2013-10-07 DIAGNOSIS — E46 Unspecified protein-calorie malnutrition: Secondary | ICD-10-CM

## 2013-10-07 DIAGNOSIS — D701 Agranulocytosis secondary to cancer chemotherapy: Secondary | ICD-10-CM

## 2013-10-07 DIAGNOSIS — Z510 Encounter for antineoplastic radiation therapy: Secondary | ICD-10-CM | POA: Diagnosis not present

## 2013-10-07 DIAGNOSIS — D72819 Decreased white blood cell count, unspecified: Secondary | ICD-10-CM

## 2013-10-07 DIAGNOSIS — Z931 Gastrostomy status: Secondary | ICD-10-CM

## 2013-10-07 DIAGNOSIS — Z5111 Encounter for antineoplastic chemotherapy: Secondary | ICD-10-CM

## 2013-10-07 DIAGNOSIS — R070 Pain in throat: Secondary | ICD-10-CM

## 2013-10-07 DIAGNOSIS — J988 Other specified respiratory disorders: Secondary | ICD-10-CM

## 2013-10-07 DIAGNOSIS — E871 Hypo-osmolality and hyponatremia: Secondary | ICD-10-CM

## 2013-10-07 DIAGNOSIS — Z720 Tobacco use: Secondary | ICD-10-CM

## 2013-10-07 DIAGNOSIS — D638 Anemia in other chronic diseases classified elsewhere: Secondary | ICD-10-CM

## 2013-10-07 DIAGNOSIS — F17200 Nicotine dependence, unspecified, uncomplicated: Secondary | ICD-10-CM

## 2013-10-07 DIAGNOSIS — T451X5A Adverse effect of antineoplastic and immunosuppressive drugs, initial encounter: Secondary | ICD-10-CM

## 2013-10-07 LAB — COMPREHENSIVE METABOLIC PANEL (CC13)
ALBUMIN: 3 g/dL — AB (ref 3.5–5.0)
ALT: 7 U/L (ref 0–55)
AST: 12 U/L (ref 5–34)
Alkaline Phosphatase: 65 U/L (ref 40–150)
Anion Gap: 8 mEq/L (ref 3–11)
BUN: 7.8 mg/dL (ref 7.0–26.0)
CO2: 27 meq/L (ref 22–29)
Calcium: 8.9 mg/dL (ref 8.4–10.4)
Chloride: 100 mEq/L (ref 98–109)
Creatinine: 0.7 mg/dL (ref 0.7–1.3)
GLUCOSE: 133 mg/dL (ref 70–140)
Potassium: 4.2 mEq/L (ref 3.5–5.1)
SODIUM: 135 meq/L — AB (ref 136–145)
Total Bilirubin: 0.2 mg/dL (ref 0.20–1.20)
Total Protein: 6.2 g/dL — ABNORMAL LOW (ref 6.4–8.3)

## 2013-10-07 LAB — CBC WITH DIFFERENTIAL/PLATELET
BASO%: 0.6 % (ref 0.0–2.0)
Basophils Absolute: 0 10*3/uL (ref 0.0–0.1)
EOS ABS: 0.4 10*3/uL (ref 0.0–0.5)
EOS%: 11.9 % — ABNORMAL HIGH (ref 0.0–7.0)
HCT: 34.4 % — ABNORMAL LOW (ref 38.4–49.9)
HGB: 11.4 g/dL — ABNORMAL LOW (ref 13.0–17.1)
LYMPH%: 17.6 % (ref 14.0–49.0)
MCH: 30.4 pg (ref 27.2–33.4)
MCHC: 33.2 g/dL (ref 32.0–36.0)
MCV: 91.6 fL (ref 79.3–98.0)
MONO#: 0.5 10*3/uL (ref 0.1–0.9)
MONO%: 14.2 % — ABNORMAL HIGH (ref 0.0–14.0)
NEUT%: 55.7 % (ref 39.0–75.0)
NEUTROS ABS: 1.9 10*3/uL (ref 1.5–6.5)
Platelets: 177 10*3/uL (ref 140–400)
RBC: 3.76 10*6/uL — AB (ref 4.20–5.82)
RDW: 14.9 % — ABNORMAL HIGH (ref 11.0–14.6)
WBC: 3.3 10*3/uL — AB (ref 4.0–10.3)
lymph#: 0.6 10*3/uL — ABNORMAL LOW (ref 0.9–3.3)

## 2013-10-07 LAB — MAGNESIUM (CC13): Magnesium: 2.1 mg/dl (ref 1.5–2.5)

## 2013-10-07 MED ORDER — DEXAMETHASONE SODIUM PHOSPHATE 20 MG/5ML IJ SOLN
12.0000 mg | Freq: Once | INTRAMUSCULAR | Status: AC
  Administered 2013-10-07: 12 mg via INTRAVENOUS

## 2013-10-07 MED ORDER — SODIUM CHLORIDE 0.9 % IV SOLN
100.0000 mg/m2 | Freq: Once | INTRAVENOUS | Status: AC
  Administered 2013-10-07: 180 mg via INTRAVENOUS
  Filled 2013-10-07: qty 180

## 2013-10-07 MED ORDER — BIAFINE EX EMUL
Freq: Two times a day (BID) | CUTANEOUS | Status: DC
  Administered 2013-10-07: 09:00:00 via TOPICAL

## 2013-10-07 MED ORDER — SODIUM CHLORIDE 0.9 % IJ SOLN
10.0000 mL | INTRAMUSCULAR | Status: DC | PRN
  Administered 2013-10-07: 10 mL
  Filled 2013-10-07: qty 10

## 2013-10-07 MED ORDER — POTASSIUM CHLORIDE 2 MEQ/ML IV SOLN
Freq: Once | INTRAVENOUS | Status: AC
  Administered 2013-10-07: 10:00:00 via INTRAVENOUS
  Filled 2013-10-07: qty 10

## 2013-10-07 MED ORDER — PALONOSETRON HCL INJECTION 0.25 MG/5ML
INTRAVENOUS | Status: AC
  Filled 2013-10-07: qty 5

## 2013-10-07 MED ORDER — PALONOSETRON HCL INJECTION 0.25 MG/5ML
0.2500 mg | Freq: Once | INTRAVENOUS | Status: AC
  Administered 2013-10-07: 0.25 mg via INTRAVENOUS

## 2013-10-07 MED ORDER — HEPARIN SOD (PORK) LOCK FLUSH 100 UNIT/ML IV SOLN
500.0000 [IU] | Freq: Once | INTRAVENOUS | Status: AC | PRN
  Administered 2013-10-07: 500 [IU]
  Filled 2013-10-07: qty 5

## 2013-10-07 MED ORDER — DEXAMETHASONE SODIUM PHOSPHATE 20 MG/5ML IJ SOLN
INTRAMUSCULAR | Status: AC
  Filled 2013-10-07: qty 5

## 2013-10-07 MED ORDER — MAGIC MOUTHWASH W/LIDOCAINE: ORAL | Status: DC

## 2013-10-07 MED ORDER — SODIUM CHLORIDE 0.9 % IV SOLN
Freq: Once | INTRAVENOUS | Status: AC
  Administered 2013-10-07: 10:00:00 via INTRAVENOUS

## 2013-10-07 MED ORDER — SODIUM CHLORIDE 0.9 % IV SOLN
150.0000 mg | Freq: Once | INTRAVENOUS | Status: AC
  Administered 2013-10-07: 150 mg via INTRAVENOUS
  Filled 2013-10-07: qty 5

## 2013-10-07 NOTE — Assessment & Plan Note (Signed)
This is likely anemia of chronic disease. The patient denies recent history of bleeding such as epistaxis, hematuria or hematochezia. He is asymptomatic from the anemia. We will observe for now.  

## 2013-10-07 NOTE — Progress Notes (Signed)
Nutrition followup completed with patient during chemotherapy.  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  Patient is status post PEG August 18.  He is flushing his PEG on a daily basis and has good understanding of how to do this.  Weight is relatively stable and documented as 152.8 pounds on September 9.  Patient would like samples of oral nutrition supplements.  Nutrition diagnosis: Predicted suboptimal energy intake continues.  Intervention: Patient will continue meals and snacks throughout the day to promote continued weight stabilization. Provided patient with samples of oral nutrition supplements. Patient educated to continue water flushes via PEG.  Monitoring, evaluation, goals: Patient is tolerating oral intake and has had weight maintenance.  Will begin tube feedings once patient's oral intake decreases and weight loss occurs less than 5% of usual body weight.  Next visit: Wednesday, September 30, during treatment.   **Disclaimer: This note was dictated with voice recognition software. Similar sounding words can inadvertently be transcribed and this note may contain transcription errors which may not have been corrected upon publication of note.**

## 2013-10-07 NOTE — Assessment & Plan Note (Signed)
  The patient is able to swallow food and I encourage him to eat as much as he can.

## 2013-10-07 NOTE — Progress Notes (Signed)
Weekly rad txs b/ lneck , patient stated slight sore throat mild,, mild erythema on neck,  still eating burgers,fries, drinking well ", has labs and infusion 7 hours today ,paged MD, no other c/o, has peg tube, flushes 2x day with water 1/2 full syringe stated patient, gave baifine cream applied to neck to show patient how to apply, will need to do 2x day,after rad treatments and bedtime, patient given rx for magicm w, and placed in envelope and bag with the baifine,escorted patient to lobby for lab and infusion appts, will call patient caregivers at [phone] and will also print instructions on biafine applications and magic mouthwash, called and spoke caregiver Darrick Meigs, she gave verbal understanding of magic mouth wash and biafine cream 8:53 AM

## 2013-10-07 NOTE — Telephone Encounter (Signed)
s.w. pt living facility....they will have him pick up new sched tomorrow

## 2013-10-07 NOTE — Progress Notes (Signed)
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   08/13/2013 Procedure Laryngoscopy showed the right false vocal fold and the right and midline laryngeal surface of the epiglottis showed a fungating, irregular mass   08/26/2013 Imaging PET scan showed right supraglottic mass with metastatic bilateral level II and III lymph nodes. Resultant severe narrowing of the airway at or just above the vocal folds.    09/15/2013 Procedure He has placement of feeding tube and port.   09/16/2013 -  Chemotherapy He started on high dose cisplatin chemotherapy with radiation   09/16/2013 -  Radiation Therapy He started on daily radiation therapy    INTERVAL HISTORY: Please see below for problem oriented charting. He is seen prior to cycle 2 of treatment. He is doing well. He has minimum pain and nausea but no vomiting. He is still able to tolerate diet 100%.  REVIEW OF SYSTEMS:   Constitutional: Denies fevers, chills or abnormal weight loss Eyes: Denies blurriness of vision Ears, nose, mouth, throat, and face: Denies mucositis or sore throat Respiratory: Denies cough, dyspnea or wheezes Cardiovascular: Denies palpitation, chest discomfort or lower extremity swelling Skin: Denies abnormal skin rashes Lymphatics: Denies new lymphadenopathy or easy bruising Neurological:Denies  numbness, tingling or new weaknesses Behavioral/Psych: Mood is stable, no new changes  All other systems were reviewed with the patient and are negative.  I have reviewed the past medical history, past surgical history, social history and family history with the patient and they are unchanged from previous note.  ALLERGIES:  has No Known Allergies.  MEDICATIONS:  Current Outpatient Prescriptions  Medication Sig Dispense Refill  . acetaminophen (TYLENOL) 325 MG tablet Take 650 mg by mouth every 6 (six) hours as needed (pain).      . Alum & Mag Hydroxide-Simeth (MAGIC MOUTHWASH W/LIDOCAINE) SOLN Pharmacy: Mix 1 part 2% viscous lidocaine with one part H2O.  No other ingredients. Patient sig: swallow 10 mL 30 min before meals and 30 before bedtime, up to QID PRN sore throat.  500 mL  5  . aspirin EC 81 MG tablet Take 81 mg by mouth daily.      . cholecalciferol (VITAMIN D) 400 UNITS TABS tablet Take 800 Units by mouth daily.      . cycloSPORINE (RESTASIS) 0.05 % ophthalmic emulsion Place 1 drop into both eyes 2 (two) times daily.      Marland Kitchen emollient (BIAFINE) cream Apply 1 application topically 2 (two) times daily. Apply after rad txs and bedtime daily and on weekends      . guaifenesin (ROBITUSSIN) 100 MG/5ML syrup Take 200 mg by mouth 3 (three) times daily as needed for cough.      . hydrocerin (EUCERIN) CREA Apply 1 application topically 2 (two) times daily. Spread to bilateral palm twice daily      . HYDROCODONE-ACETAMINOPHEN PO Take 15 mLs by mouth  every 4 (four) hours as needed (Hydorcodone 7.5/325mg /28ml oral solution.Pain).       Marland Kitchen HYDROmorphone (DILAUDID) 2 MG tablet Take 0.5 tablets (1 mg total) by mouth every 4 (four) hours as needed for moderate pain or severe pain.  30 tablet  0  . lidocaine-prilocaine (EMLA) cream Apply 1 application topically as needed.  30 g  6  . ondansetron (ZOFRAN) 4 MG tablet Take 4 mg by mouth 3 (three) times daily as needed for nausea or vomiting.       .  polyethylene glycol (MIRALAX) packet Take 17 g by mouth daily.  14 each  0  . promethazine (PHENERGAN) 25 MG tablet Take 1 tablet (25 mg total) by mouth every 6 (six) hours as needed for nausea.  60 tablet  3  . risperiDONE (RISPERDAL) 2 MG tablet Take 2 mg by mouth 2 (two) times daily.      . simvastatin (ZOCOR) 10 MG tablet Take 10 mg by mouth at bedtime.      Marland Kitchen OVER THE COUNTER MEDICATION Take 237 mLs by mouth 3 (three) times daily. [place]       No current facility-administered medications for this visit.   Facility-Administered Medications Ordered in Other Visits  Medication Dose Route Frequency Provider Last Rate Last Dose  . 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 No ulceration is noted. EYES: normal, Conjunctiva are pink and non-injected, sclera clear OROPHARYNX:no exudate, no erythema and lips, buccal mucosa, and tongue normal  NECK: supple, thyroid normal size, non-tender, without nodularity LYMPH:  no palpable lymphadenopathy in the cervical, axillary or inguinal LUNGS: clear to auscultation and percussion with normal breathing effort HEART: regular rate & rhythm and no murmurs and no lower extremity edema ABDOMEN:abdomen soft, non-tender and normal bowel sounds. The feeding tube in situ with no signs of infection Musculoskeletal:no cyanosis of digits and no clubbing  NEURO: alert & oriented x 3 with fluent speech, no focal motor/sensory deficits  LABORATORY DATA:  I have reviewed the data as listed    Component Value Date/Time   NA 135* 10/07/2013 0855   NA 137 09/15/2013 0940   K 4.2 10/07/2013 0855   K 3.9 09/15/2013 0940   CL 99 09/15/2013 0940   CO2 27 10/07/2013 0855   CO2 28 09/15/2013 0940   GLUCOSE 133 10/07/2013 0855   GLUCOSE  112* 09/15/2013 0940   BUN 7.8 10/07/2013 0855   BUN 9 09/15/2013 0940   CREATININE 0.7 10/07/2013 0855   CREATININE 0.62 09/15/2013 0940   CALCIUM 8.9 10/07/2013 0855   CALCIUM 10.0 09/15/2013 0940   PROT 6.2* 10/07/2013 0855   PROT 6.5 09/07/2013 1430   ALBUMIN 3.0* 10/07/2013 0855   ALBUMIN 3.3* 09/07/2013 1430   AST 12 10/07/2013 0855   AST 11 09/07/2013 1430   ALT 7 10/07/2013 0855   ALT 7 09/07/2013 1430   ALKPHOS 65 10/07/2013 0855   ALKPHOS 92 09/07/2013 1430   BILITOT 0.20 10/07/2013 0855   BILITOT <0.2* 09/07/2013 1430   GFRNONAA >90 09/15/2013 0940   GFRAA >90 09/15/2013 0940    No results found for this basename: SPEP, UPEP,  kappa and lambda light chains    Lab Results  Component Value Date   WBC 3.3* 10/07/2013   NEUTROABS 1.9  10/07/2013   HGB 11.4* 10/07/2013   HCT 34.4* 10/07/2013   MCV 91.6 10/07/2013   PLT 177 10/07/2013      Chemistry      Component Value Date/Time   NA 135* 10/07/2013 0855   NA 137 09/15/2013 0940   K 4.2 10/07/2013 0855   K 3.9 09/15/2013 0940   CL 99 09/15/2013 0940   CO2 27 10/07/2013 0855   CO2 28 09/15/2013 0940   BUN 7.8 10/07/2013 0855   BUN 9 09/15/2013 0940   CREATININE 0.7 10/07/2013 0855   CREATININE 0.62 09/15/2013 0940      Component Value Date/Time   CALCIUM 8.9 10/07/2013 0855   CALCIUM 10.0 09/15/2013 0940   ALKPHOS 65 10/07/2013 0855   ALKPHOS 92 09/07/2013 1430   AST 12 10/07/2013 0855   AST 11 09/07/2013 1430   ALT 7 10/07/2013 0855   ALT 7 09/07/2013 1430   BILITOT 0.20 10/07/2013 0855   BILITOT <0.2* 09/07/2013 1430      ASSESSMENT & PLAN:  Malignant neoplasm of supraglottis Overall, he is tolerating treatment well apart from minor expected side effects. We will proceed with treatment today without dose adjustment.  Tobacco abuse I spent some time counseling the patient the importance of tobacco cessation. He is currently attempting to quit now.     Anemia of other chronic disease This is likely anemia of chronic disease. The patient denies recent history  of bleeding such as epistaxis, hematuria or hematochezia. He is asymptomatic from the anemia. We will observe for now.        Narrowing of airway This has not been an issue. Continue close observation.    Throat pain I gave him prescription hydromorphone. I want to avoid Tylenol-containing products if possible. We discussed narcotics refill policy. He is not sure about his pain medication. He denies pain.       S/P gastrostomy His feeding tube site looks okay without signs of infection.        Protein calorie malnutrition  The patient is able to swallow food and I encourage him to eat as much as he can.     Leukopenia due to antineoplastic chemotherapy This is likely due to recent treatment. The patient denies recent history of fevers, cough, chills, diarrhea or dysuria. He is asymptomatic from the leukopenia. I will observe for now.  I will continue the chemotherapy at current dose without dosage adjustment.  If the leukopenia gets progressive worse in the future, I might have to delay his treatment or adjust the chemotherapy dose.    Hyponatremia Likely related to mild dehydration and recent chemotherapy. Observe only.   No orders of the defined types were placed in this encounter.   All questions were answered. The patient knows to call the clinic with any problems, questions or concerns. No barriers to learning was detected. I spent 30 minutes counseling the patient face to face. The total time spent in the appointment was 40 minutes and more than 50% was on counseling and review of test results     [facility], [place], MD 10/07/2013 8:09 PM

## 2013-10-07 NOTE — Assessment & Plan Note (Signed)
Overall, he is tolerating treatment well apart from minor expected side effects. We will proceed with treatment today without dose adjustment.

## 2013-10-07 NOTE — Assessment & Plan Note (Signed)
This has not been an issue. Continue close observation.

## 2013-10-07 NOTE — Assessment & Plan Note (Signed)
Likely related to mild dehydration and recent chemotherapy. Observe only.

## 2013-10-07 NOTE — Patient Instructions (Signed)
[facility] Discharge Instructions for Patients Receiving Chemotherapy  Today you received the following chemotherapy agents cisplatin.  To help prevent nausea and vomiting after your treatment, we encourage you to take your nausea medication phenergan.(promethazine).  Wait for 3 days before using the zofran.  May start using zofran this Saturday for nausea.   If you develop nausea and vomiting that is not controlled by your nausea medication, call the clinic.   BELOW ARE SYMPTOMS THAT SHOULD BE REPORTED IMMEDIATELY:  *FEVER GREATER THAN 100.5 F  *CHILLS WITH OR WITHOUT FEVER  NAUSEA AND VOMITING THAT IS NOT CONTROLLED WITH YOUR NAUSEA MEDICATION  *UNUSUAL SHORTNESS OF BREATH  *UNUSUAL BRUISING OR BLEEDING  TENDERNESS IN MOUTH AND THROAT WITH OR WITHOUT PRESENCE OF ULCERS  *URINARY PROBLEMS  *BOWEL PROBLEMS  UNUSUAL RASH Items with * indicate a potential emergency and should be followed up as soon as possible.  Feel free to call the clinic you have any questions or concerns. The clinic phone number is [phone].

## 2013-10-07 NOTE — Assessment & Plan Note (Signed)
I spent some time counseling the patient the importance of tobacco cessation. He is currently attempting to quit now.

## 2013-10-07 NOTE — Assessment & Plan Note (Signed)
His feeding tube site looks okay without signs of infection.

## 2013-10-07 NOTE — Progress Notes (Signed)
   Weekly Management Note:  outpatient    ICD-9-CM ICD-10-CM   1. Malignant neoplasm of supraglottis 161.1 C32.1 Alum & Mag Hydroxide-Simeth (MAGIC MOUTHWASH W/LIDOCAINE) SOLN     topical emolient (BIAFINE) emulsion    Current Dose:  28 Gy  Projected Dose: 70 Gy   Narrative:  The patient presents for routine under treatment assessment.  CBCT/MVCT images/Port film x-rays were reviewed.  The chart was checked. Feels well, slight sore throat. Infusion upstairs today, with labs. Not applying cream to neck, yet  Physical Findings:  weight is 152 lb 12.8 oz (69.31 kg). His oral temperature is 98.4 F (36.9 C). His blood pressure is 115/67 and his pulse is 76. His respiration is 20 and oxygen saturation is 100%.  NAD, + erythematous oropharynx, no thrush, skin dry, intact  CBC    Component Value Date/Time   WBC 3.3* 10/07/2013 0855   WBC 9.1 09/15/2013 0940   RBC 3.76* 10/07/2013 0855   RBC 4.37 09/15/2013 0940   HGB 11.4* 10/07/2013 0855   HGB 13.0 09/15/2013 0940   HCT 34.4* 10/07/2013 0855   HCT 38.2* 09/15/2013 0940   PLT 177 10/07/2013 0855   PLT 232 09/15/2013 0940   MCV 91.6 10/07/2013 0855   MCV 87.4 09/15/2013 0940   MCH 30.4 10/07/2013 0855   MCH 29.7 09/15/2013 0940   MCHC 33.2 10/07/2013 0855   MCHC 34.0 09/15/2013 0940   RDW 14.9* 10/07/2013 0855   RDW 13.8 09/15/2013 0940   LYMPHSABS 0.6* 10/07/2013 0855   LYMPHSABS 1.8 09/15/2013 0940   MONOABS 0.5 10/07/2013 0855   MONOABS 0.8 09/15/2013 0940   EOSABS 0.4 10/07/2013 0855   EOSABS 0.2 09/15/2013 0940   BASOSABS 0.0 10/07/2013 0855   BASOSABS 0.0 09/15/2013 0940     CMP     Component Value Date/Time   NA 136 10/01/2013 0843   NA 137 09/15/2013 0940   K 4.1 10/01/2013 0843   K 3.9 09/15/2013 0940   CL 99 09/15/2013 0940   CO2 31* 10/01/2013 0843   CO2 28 09/15/2013 0940   GLUCOSE 106 10/01/2013 0843   GLUCOSE 112* 09/15/2013 0940   BUN 12.6 10/01/2013 0843   BUN 9 09/15/2013 0940   CREATININE 0.7 10/01/2013 0843   CREATININE 0.62 09/15/2013 0940   CALCIUM 9.7 10/01/2013 0843   CALCIUM 10.0 09/15/2013 0940   PROT 6.1* 10/01/2013 0843   PROT 6.5 09/07/2013 1430   ALBUMIN 2.8* 10/01/2013 0843   ALBUMIN 3.3* 09/07/2013 1430   AST 12 10/01/2013 0843   AST 11 09/07/2013 1430   ALT 10 10/01/2013 0843   ALT 7 09/07/2013 1430   ALKPHOS 68 10/01/2013 0843   ALKPHOS 92 09/07/2013 1430   BILITOT <0.20 10/01/2013 0843   BILITOT <0.2* 09/07/2013 1430   GFRNONAA >90 09/15/2013 0940   GFRAA >90 09/15/2013 0940     Impression:  The patient is tolerating radiotherapy.   Plan:  Continue radiotherapy as planned. biafine given for skin over neck; lidocaine mouthwash Rx given for sore throat. Thayer Headings, RN, will call pt's home caregivers to explain how to use these for symptom management.  -----------------------------------  Eppie Gibson, MD

## 2013-10-07 NOTE — Assessment & Plan Note (Signed)
I gave him prescription hydromorphone. I want to avoid Tylenol-containing products if possible. We discussed narcotics refill policy. He is not sure about his pain medication. He denies pain.

## 2013-10-07 NOTE — Assessment & Plan Note (Signed)
This is likely due to recent treatment. The patient denies recent history of fevers, cough, chills, diarrhea or dysuria. He is asymptomatic from the leukopenia. I will observe for now.  I will continue the chemotherapy at current dose without dosage adjustment.  If the leukopenia gets progressive worse in the future, I might have to delay his treatment or adjust the chemotherapy dose. 

## 2013-10-07 NOTE — Progress Notes (Signed)
Dr. Alvy Bimler here to see patient in Infusion room

## 2013-10-08 ENCOUNTER — Encounter: Payer: Self-pay | Admitting: *Deleted

## 2013-10-08 ENCOUNTER — Ambulatory Visit
Admission: RE | Admit: 2013-10-08 | Discharge: 2013-10-08 | Disposition: A | Discharge status: Home / Self Care | Payer: Medicare Other | Source: Ambulatory Visit | Attending: Radiation Oncology | Admitting: Radiation Oncology

## 2013-10-08 DIAGNOSIS — Z510 Encounter for antineoplastic radiation therapy: Secondary | ICD-10-CM | POA: Diagnosis not present

## 2013-10-09 ENCOUNTER — Ambulatory Visit
Admission: RE | Admit: 2013-10-09 | Discharge: 2013-10-09 | Disposition: A | Discharge status: Home / Self Care | Payer: Medicare Other | Source: Ambulatory Visit | Attending: Radiation Oncology | Admitting: Radiation Oncology

## 2013-10-09 DIAGNOSIS — Z510 Encounter for antineoplastic radiation therapy: Secondary | ICD-10-CM | POA: Diagnosis not present

## 2013-10-12 ENCOUNTER — Ambulatory Visit
Admission: RE | Admit: 2013-10-12 | Discharge: 2013-10-12 | Disposition: A | Discharge status: Home / Self Care | Payer: Medicare Other | Source: Ambulatory Visit | Attending: Radiation Oncology | Admitting: Radiation Oncology

## 2013-10-12 VITALS — BP 117/77 | HR 83 | Temp 98.1°F | Wt 157.0 lb

## 2013-10-12 DIAGNOSIS — Z510 Encounter for antineoplastic radiation therapy: Secondary | ICD-10-CM | POA: Diagnosis not present

## 2013-10-12 DIAGNOSIS — C321 Malignant neoplasm of supraglottis: Secondary | ICD-10-CM

## 2013-10-12 NOTE — Progress Notes (Signed)
Patient here for weekly assessment of radiation to head/neck for supraglottic cancer.Today will be treatment #17 as not treated yet.Denies pain.Had nausea over the week-end that was finally relieved with zofran.States he had to lie down for several hours.Skin mildly discolored.Applying biafine at bedtime.Small area of redness around peg tube, cleaned with normal saline and will apply neosporin and drain sponge.Rednessfrom flange sticking in skin.

## 2013-10-12 NOTE — Progress Notes (Signed)
   Weekly Management Note:  Outpatient  Supraglottic carcinoma  Current Dose:  32 Gy  Projected Dose: 70 Gy   Narrative:  The patient presents for routine under treatment assessment.  CBCT/MVCT images/Port film x-rays were reviewed.  The chart was checked. Still smoking 4 cigs/day.  Would like to quit.  Slightly sore throat.  Not getting offered Lidocaine MMW as Rx'd.  Physical Findings:  weight is 157 lb (71.215 kg). His temperature is 98.1 F (36.7 C). His blood pressure is 117/77 and his pulse is 83. His oxygen saturation is 97%.  oropharynx erythema.  Neck - erythema of skin, intact.  CBC    Component Value Date/Time   WBC 3.3* 10/07/2013 0855   WBC 9.1 09/15/2013 0940   RBC 3.76* 10/07/2013 0855   RBC 4.37 09/15/2013 0940   HGB 11.4* 10/07/2013 0855   HGB 13.0 09/15/2013 0940   HCT 34.4* 10/07/2013 0855   HCT 38.2* 09/15/2013 0940   PLT 177 10/07/2013 0855   PLT 232 09/15/2013 0940   MCV 91.6 10/07/2013 0855   MCV 87.4 09/15/2013 0940   MCH 30.4 10/07/2013 0855   MCH 29.7 09/15/2013 0940   MCHC 33.2 10/07/2013 0855   MCHC 34.0 09/15/2013 0940   RDW 14.9* 10/07/2013 0855   RDW 13.8 09/15/2013 0940   LYMPHSABS 0.6* 10/07/2013 0855   LYMPHSABS 1.8 09/15/2013 0940   MONOABS 0.5 10/07/2013 0855   MONOABS 0.8 09/15/2013 0940   EOSABS 0.4 10/07/2013 0855   EOSABS 0.2 09/15/2013 0940   BASOSABS 0.0 10/07/2013 0855   BASOSABS 0.0 09/15/2013 0940     CMP     Component Value Date/Time   NA 135* 10/07/2013 0855   NA 137 09/15/2013 0940   K 4.2 10/07/2013 0855   K 3.9 09/15/2013 0940   CL 99 09/15/2013 0940   CO2 27 10/07/2013 0855   CO2 28 09/15/2013 0940   GLUCOSE 133 10/07/2013 0855   GLUCOSE 112* 09/15/2013 0940   BUN 7.8 10/07/2013 0855   BUN 9 09/15/2013 0940   CREATININE 0.7 10/07/2013 0855   CREATININE 0.62 09/15/2013 0940   CALCIUM 8.9 10/07/2013 0855   CALCIUM 10.0 09/15/2013 0940   PROT 6.2* 10/07/2013 0855   PROT 6.5 09/07/2013 1430   ALBUMIN 3.0* 10/07/2013 0855   ALBUMIN 3.3* 09/07/2013 1430   AST 12 10/07/2013  0855   AST 11 09/07/2013 1430   ALT 7 10/07/2013 0855   ALT 7 09/07/2013 1430   ALKPHOS 65 10/07/2013 0855   ALKPHOS 92 09/07/2013 1430   BILITOT 0.20 10/07/2013 0855   BILITOT <0.2* 09/07/2013 1430   GFRNONAA >90 09/15/2013 0940   GFRAA >90 09/15/2013 0940     Impression:  The patient is tolerating radiotherapy.   Plan:  Continue radiotherapy as planned. The patient continues to use tobacco. The patient was counseled to stop using tobacco and was offered pharmacotherapy and further counseling to help with this. The patient would like to try pharmacotherapy and further counseling at this time. [facility] hotline help # provided. Gayleen Orem, RN, our Head and Neck Oncology Navigator Will call his group home to find out how I should Rx Nicotine Patches that can be available to him. He will also inquire as to the status of the lidocaine MMW Rx that we sent home w/ the pt and make sure they are offering this to him PRN for odynophagia.   -----------------------------------  Eppie Gibson, MD

## 2013-10-12 NOTE — Progress Notes (Signed)
Met with Andrew Osborn during RT to check on his well-being and to assess for needs.  He did not express any needs or concerns at this time, I encouraged him to contact me if that changes before I see him next, he verbalized understanding.  In addition: 1)  In response to my inquiry, he reported that he is smoking 5-6 cigarettes/day.  I encouraged him to set a goal of reducing daily usage 4/day during the upcoming week.  He acknowledged understanding. 2)  I provided Andrew Osborn and his transporter, Ivin Booty, an Epic calendar with upcoming appts. Continuing navigation as L2 patient (treatments established).  Gayleen Orem, RN, BSN, [place] [facility] [place] [phone]

## 2013-10-13 ENCOUNTER — Telehealth: Payer: Self-pay | Admitting: Hematology and Oncology

## 2013-10-13 ENCOUNTER — Encounter: Payer: Self-pay | Admitting: *Deleted

## 2013-10-13 ENCOUNTER — Ambulatory Visit
Admission: RE | Admit: 2013-10-13 | Discharge: 2013-10-13 | Disposition: A | Discharge status: Home / Self Care | Payer: Medicare Other | Source: Ambulatory Visit | Attending: Radiation Oncology | Admitting: Radiation Oncology

## 2013-10-13 ENCOUNTER — Encounter: Payer: Self-pay | Admitting: Hematology and Oncology

## 2013-10-13 ENCOUNTER — Ambulatory Visit (HOSPITAL_BASED_OUTPATIENT_CLINIC_OR_DEPARTMENT_OTHER): Payer: Medicare Other | Admitting: Hematology and Oncology

## 2013-10-13 VITALS — BP 113/63 | HR 76 | Temp 98.3°F | Resp 18 | Ht 70.5 in | Wt 154.2 lb

## 2013-10-13 DIAGNOSIS — Z931 Gastrostomy status: Secondary | ICD-10-CM

## 2013-10-13 DIAGNOSIS — L589 Radiodermatitis, unspecified: Secondary | ICD-10-CM

## 2013-10-13 DIAGNOSIS — Z720 Tobacco use: Secondary | ICD-10-CM

## 2013-10-13 DIAGNOSIS — R070 Pain in throat: Secondary | ICD-10-CM

## 2013-10-13 DIAGNOSIS — C321 Malignant neoplasm of supraglottis: Secondary | ICD-10-CM

## 2013-10-13 DIAGNOSIS — Z510 Encounter for antineoplastic radiation therapy: Secondary | ICD-10-CM | POA: Diagnosis not present

## 2013-10-13 DIAGNOSIS — F17200 Nicotine dependence, unspecified, uncomplicated: Secondary | ICD-10-CM

## 2013-10-13 MED ORDER — HYDROMORPHONE HCL 2 MG PO TABS
2.0000 mg | ORAL_TABLET | ORAL | Status: DC | PRN
Start: 2013-10-13 — End: 2014-03-05

## 2013-10-13 NOTE — Assessment & Plan Note (Signed)
I spent some time counseling the patient the importance of tobacco cessation. He is currently attempting to quit now.

## 2013-10-13 NOTE — Progress Notes (Signed)
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   08/13/2013 Procedure Laryngoscopy showed the right false vocal fold and the right and midline laryngeal surface of the epiglottis showed a fungating, irregular mass   08/26/2013 Imaging PET scan showed right supraglottic mass with metastatic bilateral level II and III lymph nodes. Resultant severe narrowing of the airway at or just above the vocal folds.    09/15/2013 Procedure He has placement of feeding tube and port.   09/16/2013 -  Chemotherapy He started on high dose cisplatin chemotherapy with radiation   09/16/2013 -  Radiation Therapy He started on daily radiation therapy    INTERVAL HISTORY: Please see below for problem oriented charting. He's doing well. He is still eating 100% by mouth. He has persistent sore throat, well-controlled with current pain medicine.  REVIEW OF SYSTEMS:   Constitutional: Denies fevers, chills or abnormal weight loss Eyes: Denies blurriness of vision Respiratory: Denies cough, dyspnea or wheezes Cardiovascular: Denies palpitation, chest discomfort or lower extremity swelling Gastrointestinal:  Denies nausea, heartburn or change in bowel habits Lymphatics: Denies new lymphadenopathy or easy  bruising Neurological:Denies numbness, tingling or new weaknesses Behavioral/Psych: Mood is stable, no new changes  All other systems were reviewed with the patient and are negative.  I have reviewed the past medical history, past surgical history, social history and family history with the patient and they are unchanged from previous note.  ALLERGIES:  has No Known Allergies.  MEDICATIONS:  Current Outpatient Prescriptions  Medication Sig Dispense Refill  . acetaminophen (TYLENOL) 325 MG tablet Take 650 mg by mouth every 6 (six) hours as needed (pain).      . Alum & Mag Hydroxide-Simeth (MAGIC MOUTHWASH W/LIDOCAINE) SOLN Pharmacy: Mix 1 part 2% viscous lidocaine with one part H2O.  No other ingredients. Patient sig: swallow 10 mL 30 min before meals and 30 before bedtime, up to QID PRN sore throat.  500 mL  5  . aspirin EC 81 MG tablet Take 81 mg by mouth daily.      . cholecalciferol (VITAMIN D) 400 UNITS TABS tablet Take 800 Units by mouth daily.      . cycloSPORINE (RESTASIS) 0.05 % ophthalmic emulsion Place 1 drop into both eyes 2 (two) times daily.      Marland Kitchen emollient (BIAFINE) cream Apply 1 application topically 2 (two) times daily. Apply after rad txs and bedtime daily and on weekends      . guaifenesin (ROBITUSSIN) 100 MG/5ML syrup Take 200 mg by mouth 3 (three) times daily as needed for cough.      . hydrocerin (EUCERIN) CREA Apply 1 application topically 2 (two) times daily. Spread to bilateral palm twice daily      . HYDROCODONE-ACETAMINOPHEN PO Take 15 mLs by mouth every 4 (four) hours as needed (Hydorcodone  7.5/325mg /2ml oral solution.Pain).       Marland Kitchen HYDROmorphone (DILAUDID) 2 MG tablet Take 1 tablet (2 mg total) by mouth every 4 (four) hours as needed for moderate pain or severe pain.  30 tablet  0  . lidocaine-prilocaine (EMLA) cream Apply 1 application topically as needed.  30 g  6  . ondansetron (ZOFRAN) 4 MG tablet Take 4 mg by mouth 3 (three) times daily as needed for nausea  or vomiting.       Marland Kitchen OVER THE COUNTER MEDICATION Take 237 mLs by mouth 3 (three) times daily. [employer]      . polyethylene glycol (MIRALAX) packet Take 17 g by mouth daily.  14 each  0  . promethazine (PHENERGAN) 25 MG tablet Take 1 tablet (25 mg total) by mouth every 6 (six) hours as needed for nausea.  60 tablet  3  . risperiDONE (RISPERDAL) 2 MG tablet Take 2 mg by mouth 2 (two) times daily.      . simvastatin (ZOCOR) 10 MG tablet Take 10 mg by mouth at bedtime.       No current facility-administered medications for this visit.    PHYSICAL EXAMINATION: ECOG PERFORMANCE STATUS: 1 - Symptomatic but completely ambulatory  Filed Vitals:   10/13/13 0854  BP: 113/63  Pulse: 76  Temp: 98.3 F (36.8 C)  Resp: 18   Filed Weights   10/13/13 0854  Weight: 154 lb 3.2 oz (69.945 kg)    GENERAL:alert, no distress and comfortable SKIN: He has radiation-induced skin injury. No ulceration. EYES: normal, Conjunctiva are pink and non-injected, sclera clear OROPHARYNX:no exudate, no erythema and lips, buccal mucosa, and tongue normal  NECK: supple, thyroid normal size, non-tender, without nodularity LYMPH:  no palpable lymphadenopathy in the cervical, axillary or inguinal LUNGS: clear to auscultation and percussion with normal breathing effort HEART: regular rate & rhythm and no murmurs and no lower extremity edema ABDOMEN:abdomen soft, non-tender and normal bowel sounds. Feeding tube site looks okay. Musculoskeletal:no cyanosis of digits and no clubbing  NEURO: alert & oriented x 3 with fluent speech, no focal motor/sensory deficits  LABORATORY DATA:  I have reviewed the data as listed    Component Value Date/Time   NA 135* 10/07/2013 0855   NA 137 09/15/2013 0940   K 4.2 10/07/2013 0855   K 3.9 09/15/2013 0940   CL 99 09/15/2013 0940   CO2 27 10/07/2013 0855   CO2 28 09/15/2013 0940   GLUCOSE 133 10/07/2013 0855   GLUCOSE 112* 09/15/2013 0940   BUN 7.8 10/07/2013 0855   BUN 9 09/15/2013 0940    CREATININE 0.7 10/07/2013 0855   CREATININE 0.62 09/15/2013 0940   CALCIUM 8.9 10/07/2013 0855   CALCIUM 10.0 09/15/2013 0940   PROT 6.2* 10/07/2013 0855   PROT 6.5 09/07/2013 1430   ALBUMIN 3.0* 10/07/2013 0855   ALBUMIN 3.3* 09/07/2013 1430   AST 12 10/07/2013 0855   AST 11 09/07/2013 1430   ALT 7 10/07/2013 0855   ALT 7 09/07/2013 1430   ALKPHOS 65 10/07/2013 0855   ALKPHOS 92 09/07/2013 1430   BILITOT 0.20 10/07/2013 0855   BILITOT <0.2* 09/07/2013 1430   GFRNONAA >90 09/15/2013 0940   GFRAA >90 09/15/2013 0940    No results found for this basename: SPEP, UPEP,  kappa and lambda light chains    Lab Results  Component Value Date   WBC 3.3* 10/07/2013   NEUTROABS 1.9 10/07/2013   HGB 11.4* 10/07/2013   HCT 34.4* 10/07/2013  MCV 91.6 10/07/2013   PLT 177 10/07/2013      Chemistry      Component Value Date/Time   NA 135* 10/07/2013 0855   NA 137 09/15/2013 0940   K 4.2 10/07/2013 0855   K 3.9 09/15/2013 0940   CL 99 09/15/2013 0940   CO2 27 10/07/2013 0855   CO2 28 09/15/2013 0940   BUN 7.8 10/07/2013 0855   BUN 9 09/15/2013 0940   CREATININE 0.7 10/07/2013 0855   CREATININE 0.62 09/15/2013 0940      Component Value Date/Time   CALCIUM 8.9 10/07/2013 0855   CALCIUM 10.0 09/15/2013 0940   ALKPHOS 65 10/07/2013 0855   ALKPHOS 92 09/07/2013 1430   AST 12 10/07/2013 0855   AST 11 09/07/2013 1430   ALT 7 10/07/2013 0855   ALT 7 09/07/2013 1430   BILITOT 0.20 10/07/2013 0855   BILITOT <0.2* 09/07/2013 1430      ASSESSMENT & PLAN:  Malignant neoplasm of supraglottis Overall, he is tolerating treatment well apart from minor expected side effects. We will continue weekly supportive care visit.   Tobacco abuse I spent some time counseling the patient the importance of tobacco cessation. He is currently attempting to quit now.       Throat pain I gave him prescription hydromorphone. I want to avoid Tylenol-containing products if possible. We discussed narcotics refill policy. He is not sure about his pain  medication. He denies pain.         S/P gastrostomy His feeding tube site looks okay without signs of infection.          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 He will continue using topical emollient cream.   No orders of the defined types were placed in this encounter.   All questions were answered. The patient knows to call the clinic with any problems, questions or concerns. No barriers to learning was detected. I spent 25 minutes counseling the patient face to face. The total time spent in the appointment was 30 minutes and more than 50% was on counseling and review of test results     [facility], [place], MD 10/13/2013 9:20 AM

## 2013-10-13 NOTE — Telephone Encounter (Signed)
Pt confirmed labs/ov per 09/15 POF, gave pt AVS......KJ °

## 2013-10-13 NOTE — Assessment & Plan Note (Signed)
Overall, he is tolerating treatment well apart from minor expected side effects. We will continue weekly supportive care visit.

## 2013-10-13 NOTE — Assessment & Plan Note (Signed)
I gave him prescription hydromorphone. I want to avoid Tylenol-containing products if possible. We discussed narcotics refill policy. He is not sure about his pain medication. He denies pain.

## 2013-10-13 NOTE — Assessment & Plan Note (Signed)
His feeding tube site looks okay without signs of infection.

## 2013-10-13 NOTE — Assessment & Plan Note (Signed)
There is no signs of skin ulceration. He will continue using topical emollient cream.

## 2013-10-14 ENCOUNTER — Ambulatory Visit
Admission: RE | Admit: 2013-10-14 | Discharge: 2013-10-14 | Disposition: A | Discharge status: Home / Self Care | Payer: Medicare Other | Source: Ambulatory Visit | Attending: Radiation Oncology | Admitting: Radiation Oncology

## 2013-10-14 ENCOUNTER — Telehealth: Payer: Self-pay | Admitting: *Deleted

## 2013-10-14 DIAGNOSIS — Z510 Encounter for antineoplastic radiation therapy: Secondary | ICD-10-CM | POA: Diagnosis not present

## 2013-10-14 DIAGNOSIS — C321 Malignant neoplasm of supraglottis: Secondary | ICD-10-CM

## 2013-10-14 MED ORDER — MAGIC MOUTHWASH: ORAL | Status: DC

## 2013-10-14 NOTE — Telephone Encounter (Signed)
VM from [place] at [facility] states pt needs some prescriptions.  Called back and she was unavailable.  Left message for her to call us back again.

## 2013-10-14 NOTE — Telephone Encounter (Signed)
Letecia called back from [facility]. She states they need Rx faxed to them for pt.. Faxed rx to #[phone].

## 2013-10-15 ENCOUNTER — Telehealth: Payer: Self-pay | Admitting: *Deleted

## 2013-10-15 ENCOUNTER — Ambulatory Visit
Admission: RE | Admit: 2013-10-15 | Discharge: 2013-10-15 | Disposition: A | Discharge status: Home / Self Care | Payer: Medicare Other | Source: Ambulatory Visit | Attending: Radiation Oncology | Admitting: Radiation Oncology

## 2013-10-15 DIAGNOSIS — Z510 Encounter for antineoplastic radiation therapy: Secondary | ICD-10-CM | POA: Diagnosis not present

## 2013-10-15 NOTE — Telephone Encounter (Signed)
Spoke with Med [place] [employer] at [facility]: 1.  Asked her to relay to Ivin Booty (transporter) that [employer] for tomorrow morning has been cancelled d/t machine problem. 2.  Confirmed that Greg's medications are available when needed, specifically asked about MMW for periodic throat pain.  She confirmed that when he expresses symptoms needing PRN medications, he receives them.  She further stated they periodically check his needs.    Gayleen Orem, RN, BSN, [place] at [place] [phone]

## 2013-10-16 ENCOUNTER — Ambulatory Visit: Payer: Medicare Other

## 2013-10-16 NOTE — Progress Notes (Signed)
Met with patient during RT, escorted him to f/u appt with Dr. Alvy Bimler.  He did not express any needs or concerns at this time, I encouraged him to contact me if that changes before I see him next, he verbalized agreement.    Gayleen Orem, RN, BSN, [place] at [place] [phone]

## 2013-10-16 NOTE — Telephone Encounter (Signed)
Entry error

## 2013-10-19 ENCOUNTER — Ambulatory Visit: Payer: Medicare Other

## 2013-10-19 ENCOUNTER — Ambulatory Visit: Admission: RE | Admit: 2013-10-19 | Payer: Medicare Other | Source: Ambulatory Visit | Admitting: Radiation Oncology

## 2013-10-19 ENCOUNTER — Telehealth: Payer: Self-pay | Admitting: *Deleted

## 2013-10-19 NOTE — Telephone Encounter (Addendum)
Spoke with med Engineer, civil (consulting) at [facility] re: their preferred pharmacy; she indicated it was [facility] in [place].  I entered this information into patient's demographics.  Gayleen Orem, RN, BSN, [place] at [place] [phone]

## 2013-10-20 ENCOUNTER — Telehealth: Payer: Self-pay | Admitting: Hematology and Oncology

## 2013-10-20 ENCOUNTER — Encounter: Payer: Self-pay | Admitting: Hematology and Oncology

## 2013-10-20 ENCOUNTER — Ambulatory Visit (HOSPITAL_BASED_OUTPATIENT_CLINIC_OR_DEPARTMENT_OTHER): Payer: Medicare Other | Admitting: Hematology and Oncology

## 2013-10-20 ENCOUNTER — Ambulatory Visit
Admission: RE | Admit: 2013-10-20 | Discharge: 2013-10-20 | Disposition: A | Discharge status: Home / Self Care | Payer: Medicare Other | Source: Ambulatory Visit | Attending: Radiation Oncology | Admitting: Radiation Oncology

## 2013-10-20 VITALS — BP 121/72 | HR 81 | Temp 98.4°F | Resp 18 | Ht 70.5 in | Wt 153.3 lb

## 2013-10-20 DIAGNOSIS — Z720 Tobacco use: Secondary | ICD-10-CM

## 2013-10-20 DIAGNOSIS — C321 Malignant neoplasm of supraglottis: Secondary | ICD-10-CM

## 2013-10-20 DIAGNOSIS — R070 Pain in throat: Secondary | ICD-10-CM

## 2013-10-20 DIAGNOSIS — L589 Radiodermatitis, unspecified: Secondary | ICD-10-CM

## 2013-10-20 DIAGNOSIS — Z931 Gastrostomy status: Secondary | ICD-10-CM

## 2013-10-20 DIAGNOSIS — F17200 Nicotine dependence, unspecified, uncomplicated: Secondary | ICD-10-CM

## 2013-10-20 DIAGNOSIS — E46 Unspecified protein-calorie malnutrition: Secondary | ICD-10-CM

## 2013-10-20 DIAGNOSIS — E43 Unspecified severe protein-calorie malnutrition: Secondary | ICD-10-CM

## 2013-10-20 DIAGNOSIS — Z510 Encounter for antineoplastic radiation therapy: Secondary | ICD-10-CM | POA: Diagnosis not present

## 2013-10-20 NOTE — Assessment & Plan Note (Signed)
The patient is able to swallow food and I encourage him to eat as much as he can. He is not using his feeding tube yet.

## 2013-10-20 NOTE — Assessment & Plan Note (Signed)
I spent some time counseling the patient the importance of tobacco cessation. He is currently attempting to quit now.

## 2013-10-20 NOTE — Assessment & Plan Note (Signed)
Overall, he is tolerating treatment well apart from minor expected side effects. We will continue weekly supportive care visit.

## 2013-10-20 NOTE — Assessment & Plan Note (Signed)
His feeding tube site looks okay without signs of infection.

## 2013-10-20 NOTE — Telephone Encounter (Signed)
, °

## 2013-10-20 NOTE — Progress Notes (Signed)
[place] OFFICE PROGRESS NOTE  Patient Care Team: Provider Not In System as PCP - General Brooks Sailors, RN as Oncology Nurse Navigator (Oncology) Eppie Gibson, MD as Attending Physician (Radiation Oncology) Heath Lark, MD as Consulting Physician (Hematology and Oncology)  SUMMARY OF ONCOLOGIC HISTORY: Oncology History   Malignant neoplasm of supraglottis   Primary site: Larynx - Supraglottis   Staging method: AJCC 7th Edition   Clinical: Stage IVA (T2, N2, M0) signed by Heath Lark, MD on 09/07/2013  3:55 PM   Summary: Stage IVA (T2, N2, M0)        Malignant neoplasm of supraglottis   08/13/2013 Pathology Results [id number]: 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   08/13/2013 Procedure Laryngoscopy showed the right false vocal fold and the right and midline laryngeal surface of the epiglottis showed a fungating, irregular mass   08/26/2013 Imaging PET scan showed right supraglottic mass with metastatic bilateral level II and III lymph nodes. Resultant severe narrowing of the airway at or just above the vocal folds.    09/15/2013 Procedure He has placement of feeding tube and port.   09/16/2013 -  Chemotherapy He started on high dose cisplatin chemotherapy with radiation   09/16/2013 -  Radiation Therapy He started on daily radiation therapy     INTERVAL HISTORY: Please see below for problem oriented charting. He is seen as part of his weekly supportive care visit. He is doing very well. He is able to tolerate oral diet 100% by mouth. His pain appears to be well-controlled with current pain regimen. He is putting topical emollient cream for dermatitis around his neck. REVIEW OF SYSTEMS:   Constitutional: Denies fevers, chills or abnormal weight loss Eyes: Denies blurriness of vision Respiratory: Denies cough, dyspnea or wheezes Cardiovascular: Denies palpitation, chest discomfort or lower extremity  swelling Gastrointestinal:  Denies nausea, heartburn or change in bowel habits Skin: Denies abnormal skin rashes Lymphatics: Denies new lymphadenopathy or easy bruising Neurological:Denies numbness, tingling or new weaknesses Behavioral/Psych: Mood is stable, no new changes  All other systems were reviewed with the patient and are negative.  I have reviewed the past medical history, past surgical history, social history and family history with the patient and they are unchanged from previous note.  ALLERGIES:  has No Known Allergies.  MEDICATIONS:  Current Outpatient Prescriptions  Medication Sig Dispense Refill  . acetaminophen (TYLENOL) 325 MG tablet Take 650 mg by mouth every 6 (six) hours as needed (pain).      . Alum & Mag Hydroxide-Simeth (MAGIC MOUTHWASH) SOLN Swish and Spit 5 ml QID PRN,  30 minutes before eating and at bedtime.  500 mL  5  . aspirin EC 81 MG tablet Take 81 mg by mouth daily.      . cholecalciferol (VITAMIN D) 400 UNITS TABS tablet Take 800 Units by mouth daily.      . cycloSPORINE (RESTASIS) 0.05 % ophthalmic emulsion Place 1 drop into both eyes 2 (two) times daily.      Marland Kitchen emollient (BIAFINE) cream Apply 1 application topically 2 (two) times daily. Apply after rad txs and bedtime daily and on weekends      . guaifenesin (ROBITUSSIN) 100 MG/5ML syrup Take 200 mg by mouth 3 (three) times daily as needed for cough.      . hydrocerin (EUCERIN) CREA Apply 1 application topically 2 (two) times daily. Spread to bilateral palm twice daily      . HYDROCODONE-ACETAMINOPHEN PO  Take 15 mLs by mouth every 4 (four) hours as needed (Hydorcodone 7.5/325mg /29ml oral solution.Pain).       Marland Kitchen HYDROmorphone (DILAUDID) 2 MG tablet Take 1 tablet (2 mg total) by mouth every 4 (four) hours as needed for moderate pain or severe pain.  30 tablet  0  . lidocaine-prilocaine (EMLA) cream Apply 1 application topically as needed.  30 g  6  . ondansetron (ZOFRAN) 4 MG tablet Take 4 mg by mouth 3  (three) times daily as needed for nausea or vomiting.       Marland Kitchen OVER THE COUNTER MEDICATION Take 237 mLs by mouth 3 (three) times daily. [employer]      . polyethylene glycol (MIRALAX) packet Take 17 g by mouth daily.  14 each  0  . promethazine (PHENERGAN) 25 MG tablet Take 1 tablet (25 mg total) by mouth every 6 (six) hours as needed for nausea.  60 tablet  3  . risperiDONE (RISPERDAL) 2 MG tablet Take 2 mg by mouth 2 (two) times daily.      . simvastatin (ZOCOR) 10 MG tablet Take 10 mg by mouth at bedtime.       No current facility-administered medications for this visit.    PHYSICAL EXAMINATION: ECOG PERFORMANCE STATUS: 0 - Asymptomatic  Filed Vitals:   10/20/13 0852  BP: 121/72  Pulse: 81  Temp: 98.4 F (36.9 C)  Resp: 18   Filed Weights   10/20/13 0852  Weight: 153 lb 4.8 oz (69.536 kg)    GENERAL:alert, no distress and comfortable SKIN: Radiation-induced dermatitis around his neck. No ulceration. EYES: normal, Conjunctiva are pink and non-injected, sclera clear OROPHARYNX:no exudate, no erythema and lips, buccal mucosa, and tongue normal . No mucositis no thrush. NECK: supple, thyroid normal size, non-tender, without nodularity LYMPH:  no palpable lymphadenopathy in the cervical, axillary or inguinal LUNGS: clear to auscultation and percussion with normal breathing effort HEART: regular rate & rhythm and no murmurs and no lower extremity edema ABDOMEN:abdomen soft, non-tender and normal bowel sounds. Feeding tube site looks okay Musculoskeletal:no cyanosis of digits and no clubbing  NEURO: alert & oriented x 3 with fluent speech, no focal motor/sensory deficits  LABORATORY DATA:  I have reviewed the data as listed    Component Value Date/Time   NA 135* 10/07/2013 0855   NA 137 09/15/2013 0940   K 4.2 10/07/2013 0855   K 3.9 09/15/2013 0940   CL 99 09/15/2013 0940   CO2 27 10/07/2013 0855   CO2 28 09/15/2013 0940   GLUCOSE 133 10/07/2013 0855   GLUCOSE 112* 09/15/2013 0940    BUN 7.8 10/07/2013 0855   BUN 9 09/15/2013 0940   CREATININE 0.7 10/07/2013 0855   CREATININE 0.62 09/15/2013 0940   CALCIUM 8.9 10/07/2013 0855   CALCIUM 10.0 09/15/2013 0940   PROT 6.2* 10/07/2013 0855   PROT 6.5 09/07/2013 1430   ALBUMIN 3.0* 10/07/2013 0855   ALBUMIN 3.3* 09/07/2013 1430   AST 12 10/07/2013 0855   AST 11 09/07/2013 1430   ALT 7 10/07/2013 0855   ALT 7 09/07/2013 1430   ALKPHOS 65 10/07/2013 0855   ALKPHOS 92 09/07/2013 1430   BILITOT 0.20 10/07/2013 0855   BILITOT <0.2* 09/07/2013 1430   GFRNONAA >90 09/15/2013 0940   GFRAA >90 09/15/2013 0940    No results found for this basename: SPEP, UPEP,  kappa and lambda light chains    Lab Results  Component Value Date   WBC 3.3* 10/07/2013   NEUTROABS  1.9 10/07/2013   HGB 11.4* 10/07/2013   HCT 34.4* 10/07/2013   MCV 91.6 10/07/2013   PLT 177 10/07/2013      Chemistry      Component Value Date/Time   NA 135* 10/07/2013 0855   NA 137 09/15/2013 0940   K 4.2 10/07/2013 0855   K 3.9 09/15/2013 0940   CL 99 09/15/2013 0940   CO2 27 10/07/2013 0855   CO2 28 09/15/2013 0940   BUN 7.8 10/07/2013 0855   BUN 9 09/15/2013 0940   CREATININE 0.7 10/07/2013 0855   CREATININE 0.62 09/15/2013 0940      Component Value Date/Time   CALCIUM 8.9 10/07/2013 0855   CALCIUM 10.0 09/15/2013 0940   ALKPHOS 65 10/07/2013 0855   ALKPHOS 92 09/07/2013 1430   AST 12 10/07/2013 0855   AST 11 09/07/2013 1430   ALT 7 10/07/2013 0855   ALT 7 09/07/2013 1430   BILITOT 0.20 10/07/2013 0855   BILITOT <0.2* 09/07/2013 1430    ASSESSMENT & PLAN:  Malignant neoplasm of supraglottis Overall, he is tolerating treatment well apart from minor expected side effects. We will continue weekly supportive care visit.  Tobacco abuse I spent some time counseling the patient the importance of tobacco cessation. He is currently attempting to quit now.    Throat pain I gave him prescription hydromorphone. I want to avoid Tylenol-containing products if possible. We discussed narcotics refill  policy. He is not sure about his pain medication. He denies pain.    Protein calorie malnutrition The patient is able to swallow food and I encourage him to eat as much as he can. He is not using his feeding tube yet.       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 He will continue using topical emollient cream.  S/P gastrostomy His feeding tube site looks okay without signs of infection.       No orders of the defined types were placed in this encounter.   All questions were answered. The patient knows to call the clinic with any problems, questions or concerns. No barriers to learning was detected. I spent 25 minutes counseling the patient face to face. The total time spent in the appointment was 30 minutes and more than 50% was on counseling and review of test results     [facility], [place], MD 10/20/2013 10:30 AM

## 2013-10-20 NOTE — Assessment & Plan Note (Signed)
There is no signs of skin ulceration. He will continue using topical emollient cream.

## 2013-10-20 NOTE — Assessment & Plan Note (Signed)
I gave him prescription hydromorphone. I want to avoid Tylenol-containing products if possible. We discussed narcotics refill policy. He is not sure about his pain medication. He denies pain.

## 2013-10-21 ENCOUNTER — Ambulatory Visit
Admission: RE | Admit: 2013-10-21 | Discharge: 2013-10-21 | Disposition: A | Discharge status: Home / Self Care | Payer: Medicare Other | Source: Ambulatory Visit | Attending: Radiation Oncology | Admitting: Radiation Oncology

## 2013-10-21 DIAGNOSIS — Z510 Encounter for antineoplastic radiation therapy: Secondary | ICD-10-CM | POA: Diagnosis not present

## 2013-10-22 ENCOUNTER — Ambulatory Visit
Admission: RE | Admit: 2013-10-22 | Discharge: 2013-10-22 | Disposition: A | Discharge status: Home / Self Care | Payer: Medicare Other | Source: Ambulatory Visit | Attending: Radiation Oncology | Admitting: Radiation Oncology

## 2013-10-22 ENCOUNTER — Encounter: Payer: Self-pay | Admitting: *Deleted

## 2013-10-22 DIAGNOSIS — Z510 Encounter for antineoplastic radiation therapy: Secondary | ICD-10-CM | POA: Diagnosis not present

## 2013-10-23 ENCOUNTER — Ambulatory Visit
Admission: RE | Admit: 2013-10-23 | Discharge: 2013-10-23 | Disposition: A | Discharge status: Home / Self Care | Payer: Medicare Other | Source: Ambulatory Visit | Attending: Radiation Oncology | Admitting: Radiation Oncology

## 2013-10-23 ENCOUNTER — Encounter: Payer: Self-pay | Admitting: Radiation Oncology

## 2013-10-23 VITALS — BP 112/71 | HR 76 | Temp 97.9°F | Resp 18 | Wt 152.1 lb

## 2013-10-23 DIAGNOSIS — Z510 Encounter for antineoplastic radiation therapy: Secondary | ICD-10-CM | POA: Diagnosis not present

## 2013-10-23 DIAGNOSIS — L589 Radiodermatitis, unspecified: Secondary | ICD-10-CM

## 2013-10-23 DIAGNOSIS — C321 Malignant neoplasm of supraglottis: Secondary | ICD-10-CM

## 2013-10-23 MED ORDER — BIAFINE EX EMUL
CUTANEOUS | Status: DC | PRN
  Administered 2013-10-23: 09:00:00 via TOPICAL

## 2013-10-23 NOTE — Progress Notes (Signed)
To provide support and encouragement, care continuity and to assess for needs, met with patient after his daily RT: 1. He reported he is flushing PEG on daily.  He correctly explained procedure to me. 2. He confirmed that when he needs PRN medications, they are readily available at his request. 3. He reported increasing thickened saliva.  I encouraged him to use baking soda/salt rinses frequently throughout the day.  He stated he has not been b/c not available.  I told him I would provide recipe. He did not express any needs or concerns at this time, I encouraged him to contact me if that changes before I see him next, he verbalized agreement.  Gayleen Orem, RN, BSN, [place] at [place] [phone]

## 2013-10-23 NOTE — Progress Notes (Signed)
   Weekly Management Note:  Outpatient    ICD-9-CM ICD-10-CM   1. Radiation dermatitis 692.82 L58.9 topical emolient (BIAFINE) emulsion  2. Malignant neoplasm of supraglottis 161.1 C32.1     Current Dose:  48 Gy  Projected Dose: 70 Gy   Narrative:  The patient presents for routine under treatment assessment.  CBCT/MVCT images/Port film x-rays were reviewed.  The chart was checked. Doing well.  Still smoking.  Started a "pill" to reduce cravings.  Hasn't started using patches.  He has some odynophagia but swallowing food well, like pizza. He is a little more hoarse.  Physical Findings:  weight is 152 lb 1.6 oz (68.992 kg). His oral temperature is 97.9 F (36.6 C). His blood pressure is 112/71 and his pulse is 76. His respiration is 18 and oxygen saturation is 100%.  Oral mucosa moist, no thrush / lesions. Skin over neck dry, erythematous.  CBC    Component Value Date/Time   WBC 3.3* 10/07/2013 0855   WBC 9.1 09/15/2013 0940   RBC 3.76* 10/07/2013 0855   RBC 4.37 09/15/2013 0940   HGB 11.4* 10/07/2013 0855   HGB 13.0 09/15/2013 0940   HCT 34.4* 10/07/2013 0855   HCT 38.2* 09/15/2013 0940   PLT 177 10/07/2013 0855   PLT 232 09/15/2013 0940   MCV 91.6 10/07/2013 0855   MCV 87.4 09/15/2013 0940   MCH 30.4 10/07/2013 0855   MCH 29.7 09/15/2013 0940   MCHC 33.2 10/07/2013 0855   MCHC 34.0 09/15/2013 0940   RDW 14.9* 10/07/2013 0855   RDW 13.8 09/15/2013 0940   LYMPHSABS 0.6* 10/07/2013 0855   LYMPHSABS 1.8 09/15/2013 0940   MONOABS 0.5 10/07/2013 0855   MONOABS 0.8 09/15/2013 0940   EOSABS 0.4 10/07/2013 0855   EOSABS 0.2 09/15/2013 0940   BASOSABS 0.0 10/07/2013 0855   BASOSABS 0.0 09/15/2013 0940     CMP     Component Value Date/Time   NA 135* 10/07/2013 0855   NA 137 09/15/2013 0940   K 4.2 10/07/2013 0855   K 3.9 09/15/2013 0940   CL 99 09/15/2013 0940   CO2 27 10/07/2013 0855   CO2 28 09/15/2013 0940   GLUCOSE 133 10/07/2013 0855   GLUCOSE 112* 09/15/2013 0940   BUN 7.8 10/07/2013 0855   BUN 9 09/15/2013 0940   CREATININE 0.7 10/07/2013 0855   CREATININE 0.62 09/15/2013 0940   CALCIUM 8.9 10/07/2013 0855   CALCIUM 10.0 09/15/2013 0940   PROT 6.2* 10/07/2013 0855   PROT 6.5 09/07/2013 1430   ALBUMIN 3.0* 10/07/2013 0855   ALBUMIN 3.3* 09/07/2013 1430   AST 12 10/07/2013 0855   AST 11 09/07/2013 1430   ALT 7 10/07/2013 0855   ALT 7 09/07/2013 1430   ALKPHOS 65 10/07/2013 0855   ALKPHOS 92 09/07/2013 1430   BILITOT 0.20 10/07/2013 0855   BILITOT <0.2* 09/07/2013 1430   GFRNONAA >90 09/15/2013 0940   GFRAA >90 09/15/2013 0940     Impression:  The patient is tolerating radiotherapy. Smoking cessation encouraged.  Plan:  Continue radiotherapy as planned.   -----------------------------------  Eppie Gibson, MD

## 2013-10-23 NOTE — Progress Notes (Signed)
Weight and vitals stable. Hyperpigmentation of anterior neck without desquamation noted. Report using biafine bid. Provided patient with another tube of Biafine. Reports string like sputum. Denies dry mouth. Reports taste on tongue is return. Eating and drinking by mouth. Denies pain associated with swallowing. Voice hoarse and gravely. Denies nausea, vomiting or cough.

## 2013-10-25 ENCOUNTER — Ambulatory Visit: Payer: Medicare Other

## 2013-10-26 ENCOUNTER — Ambulatory Visit: Payer: Medicare Other

## 2013-10-26 ENCOUNTER — Ambulatory Visit
Admission: RE | Admit: 2013-10-26 | Discharge: 2013-10-26 | Disposition: A | Discharge status: Home / Self Care | Payer: Medicare Other | Source: Ambulatory Visit | Attending: Radiation Oncology | Admitting: Radiation Oncology

## 2013-10-26 ENCOUNTER — Other Ambulatory Visit: Payer: Self-pay | Admitting: Hematology and Oncology

## 2013-10-26 VITALS — BP 108/85 | HR 73 | Temp 98.1°F | Resp 14 | Wt 153.1 lb

## 2013-10-26 DIAGNOSIS — Z510 Encounter for antineoplastic radiation therapy: Secondary | ICD-10-CM | POA: Diagnosis not present

## 2013-10-26 DIAGNOSIS — C321 Malignant neoplasm of supraglottis: Secondary | ICD-10-CM

## 2013-10-26 MED ORDER — DOCUSATE SODIUM 100 MG PO CAPS: 100.0000 mg | ORAL_CAPSULE | Freq: Two times a day (BID) | ORAL | Status: AC

## 2013-10-26 NOTE — Progress Notes (Signed)
   Weekly Management Note:  Outpatient    ICD-9-CM ICD-10-CM   1. 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  CBCT/MVCT images/Port film x-rays were reviewed.  The chart was checked. Stable, no new complaints, other than harder stools. BMs are QD or QOD. Weight stable  Physical Findings:  weight is 153 lb 1.6 oz (69.446 kg). His oral temperature is 98.1 F (36.7 C). His blood pressure is 108/85 and his pulse is 73. His respiration is 14 and oxygen saturation is 100%.  Oropharyngeal mucosa is intact with no thrush or lesions.   Skin is dry over neck.     CBC    Component Value Date/Time   WBC 3.3* 10/07/2013 0855   WBC 9.1 09/15/2013 0940   RBC 3.76* 10/07/2013 0855   RBC 4.37 09/15/2013 0940   HGB 11.4* 10/07/2013 0855   HGB 13.0 09/15/2013 0940   HCT 34.4* 10/07/2013 0855   HCT 38.2* 09/15/2013 0940   PLT 177 10/07/2013 0855   PLT 232 09/15/2013 0940   MCV 91.6 10/07/2013 0855   MCV 87.4 09/15/2013 0940   MCH 30.4 10/07/2013 0855   MCH 29.7 09/15/2013 0940   MCHC 33.2 10/07/2013 0855   MCHC 34.0 09/15/2013 0940   RDW 14.9* 10/07/2013 0855   RDW 13.8 09/15/2013 0940   LYMPHSABS 0.6* 10/07/2013 0855   LYMPHSABS 1.8 09/15/2013 0940   MONOABS 0.5 10/07/2013 0855   MONOABS 0.8 09/15/2013 0940   EOSABS 0.4 10/07/2013 0855   EOSABS 0.2 09/15/2013 0940   BASOSABS 0.0 10/07/2013 0855   BASOSABS 0.0 09/15/2013 0940     CMP     Component Value Date/Time   NA 135* 10/07/2013 0855   NA 137 09/15/2013 0940   K 4.2 10/07/2013 0855   K 3.9 09/15/2013 0940   CL 99 09/15/2013 0940   CO2 27 10/07/2013 0855   CO2 28 09/15/2013 0940   GLUCOSE 133 10/07/2013 0855   GLUCOSE 112* 09/15/2013 0940   BUN 7.8 10/07/2013 0855   BUN 9 09/15/2013 0940   CREATININE 0.7 10/07/2013 0855   CREATININE 0.62 09/15/2013 0940   CALCIUM 8.9 10/07/2013 0855   CALCIUM 10.0 09/15/2013 0940     PROT 6.2* 10/07/2013 0855   PROT 6.5 09/07/2013 1430   ALBUMIN 3.0* 10/07/2013 0855   ALBUMIN 3.3* 09/07/2013 1430   AST 12 10/07/2013 0855   AST 11 09/07/2013 1430   ALT 7 10/07/2013 0855   ALT 7 09/07/2013 1430   ALKPHOS 65 10/07/2013 0855   ALKPHOS 92 09/07/2013 1430   BILITOT 0.20 10/07/2013 0855   BILITOT <0.2* 09/07/2013 1430   GFRNONAA >90 09/15/2013 0940   GFRAA >90 09/15/2013 0940     Impression:  The patient is tolerating radiotherapy.   Plan:  Continue radiotherapy as planned. Colace Rx for constipation.  -----------------------------------  Eppie Gibson, MD

## 2013-10-26 NOTE — Progress Notes (Signed)
He is currently in no pain. Pt complains of skin sensitiveness and itching, Fatigue and Generalized Weakness.  Pt presenting appropriate quality, quantity and organization of sentences, loud. Pt denies dysphagia. The patient eats a regular, healthy diet.. Oral exam reveals mucous membranes moist, pharynx normal without lesions, noted a small amount of yellow exudate on tongue. Skin erythema, dry desquamation of neck. Reports having episodes of constipations, reports he has a bm qd or qod.

## 2013-10-27 ENCOUNTER — Ambulatory Visit: Payer: Medicare Other

## 2013-10-27 ENCOUNTER — Encounter: Payer: Self-pay | Admitting: *Deleted

## 2013-10-27 ENCOUNTER — Other Ambulatory Visit (HOSPITAL_BASED_OUTPATIENT_CLINIC_OR_DEPARTMENT_OTHER): Payer: Medicare Other

## 2013-10-27 ENCOUNTER — Ambulatory Visit: Payer: Medicare Other | Attending: Radiation Oncology

## 2013-10-27 ENCOUNTER — Ambulatory Visit
Admission: RE | Admit: 2013-10-27 | Discharge: 2013-10-27 | Disposition: A | Discharge status: Home / Self Care | Payer: Medicare Other | Source: Ambulatory Visit | Attending: Radiation Oncology | Admitting: Radiation Oncology

## 2013-10-27 DIAGNOSIS — IMO0001 Reserved for inherently not codable concepts without codable children: Secondary | ICD-10-CM | POA: Insufficient documentation

## 2013-10-27 DIAGNOSIS — R1310 Dysphagia, unspecified: Secondary | ICD-10-CM | POA: Diagnosis not present

## 2013-10-27 DIAGNOSIS — Z510 Encounter for antineoplastic radiation therapy: Secondary | ICD-10-CM | POA: Diagnosis not present

## 2013-10-27 DIAGNOSIS — C321 Malignant neoplasm of supraglottis: Secondary | ICD-10-CM

## 2013-10-27 LAB — CBC WITH DIFFERENTIAL/PLATELET
BASO%: 0.6 % (ref 0.0–2.0)
BASOS ABS: 0 10*3/uL (ref 0.0–0.1)
EOS ABS: 0.1 10*3/uL (ref 0.0–0.5)
EOS%: 4.6 % (ref 0.0–7.0)
HEMATOCRIT: 31.7 % — AB (ref 38.4–49.9)
HEMOGLOBIN: 10.6 g/dL — AB (ref 13.0–17.1)
LYMPH%: 22.1 % (ref 14.0–49.0)
MCH: 31.1 pg (ref 27.2–33.4)
MCHC: 33.4 g/dL (ref 32.0–36.0)
MCV: 93.2 fL (ref 79.3–98.0)
MONO#: 0.5 10*3/uL (ref 0.1–0.9)
MONO%: 23.4 % — AB (ref 0.0–14.0)
NEUT%: 49.3 % (ref 39.0–75.0)
NEUTROS ABS: 1.1 10*3/uL — AB (ref 1.5–6.5)
PLATELETS: 115 10*3/uL — AB (ref 140–400)
RBC: 3.4 10*6/uL — ABNORMAL LOW (ref 4.20–5.82)
RDW: 18.2 % — ABNORMAL HIGH (ref 11.0–14.6)
WBC: 2.2 10*3/uL — ABNORMAL LOW (ref 4.0–10.3)
lymph#: 0.5 10*3/uL — ABNORMAL LOW (ref 0.9–3.3)

## 2013-10-27 LAB — COMPREHENSIVE METABOLIC PANEL (CC13)
ALBUMIN: 3.2 g/dL — AB (ref 3.5–5.0)
ALK PHOS: 58 U/L (ref 40–150)
ALT: <6 U/L (ref 0–55)
AST: 9 U/L (ref 5–34)
Anion Gap: 4 mEq/L (ref 3–11)
BUN: 12.3 mg/dL (ref 7.0–26.0)
CO2: 30 mEq/L — ABNORMAL HIGH (ref 22–29)
CREATININE: 0.8 mg/dL (ref 0.7–1.3)
Calcium: 9.1 mg/dL (ref 8.4–10.4)
Chloride: 102 mEq/L (ref 98–109)
GLUCOSE: 83 mg/dL (ref 70–140)
POTASSIUM: 4 meq/L (ref 3.5–5.1)
Sodium: 136 mEq/L (ref 136–145)
Total Bilirubin: 0.27 mg/dL (ref 0.20–1.20)
Total Protein: 6.1 g/dL — ABNORMAL LOW (ref 6.4–8.3)

## 2013-10-27 LAB — MAGNESIUM (CC13): MAGNESIUM: 2.1 mg/dL (ref 1.5–2.5)

## 2013-10-27 NOTE — Progress Notes (Signed)
Met with patient after his RT: 1. Provided him copy of pg 31 from "Radiation Therapy and You" with highlight of guideline for baking soda/salt rinse.  Encouraged him to rinse several times/day to help with thickened saliva.  Discussed also with transporter Ivin Booty. 2. He reported he is smoking 4-5 cigarettes/d.  I encouraged him to decrease, explaining that this will benefit is prognosis.   He verbalized understanding of information provided.  Continuing navigation as L2 patient (treatments established).  Gayleen Orem, RN, BSN, [place] at [place] [phone]

## 2013-10-28 ENCOUNTER — Ambulatory Visit
Admission: RE | Admit: 2013-10-28 | Discharge: 2013-10-28 | Disposition: A | Discharge status: Home / Self Care | Payer: Medicare Other | Source: Ambulatory Visit | Attending: Radiation Oncology | Admitting: Radiation Oncology

## 2013-10-28 ENCOUNTER — Ambulatory Visit: Payer: Medicare Other | Admitting: Nutrition

## 2013-10-28 ENCOUNTER — Ambulatory Visit (HOSPITAL_BASED_OUTPATIENT_CLINIC_OR_DEPARTMENT_OTHER): Payer: Medicare Other | Admitting: Hematology and Oncology

## 2013-10-28 ENCOUNTER — Ambulatory Visit (HOSPITAL_BASED_OUTPATIENT_CLINIC_OR_DEPARTMENT_OTHER): Payer: Medicare Other

## 2013-10-28 ENCOUNTER — Encounter: Payer: Self-pay | Admitting: Hematology and Oncology

## 2013-10-28 ENCOUNTER — Telehealth: Payer: Self-pay | Admitting: Hematology and Oncology

## 2013-10-28 ENCOUNTER — Ambulatory Visit: Payer: Medicare Other

## 2013-10-28 VITALS — BP 123/70 | HR 75 | Temp 97.6°F | Resp 18 | Ht 70.5 in | Wt 154.1 lb

## 2013-10-28 DIAGNOSIS — Z5111 Encounter for antineoplastic chemotherapy: Secondary | ICD-10-CM

## 2013-10-28 DIAGNOSIS — B370 Candidal stomatitis: Secondary | ICD-10-CM

## 2013-10-28 DIAGNOSIS — L589 Radiodermatitis, unspecified: Secondary | ICD-10-CM

## 2013-10-28 DIAGNOSIS — C321 Malignant neoplasm of supraglottis: Secondary | ICD-10-CM

## 2013-10-28 DIAGNOSIS — E46 Unspecified protein-calorie malnutrition: Secondary | ICD-10-CM

## 2013-10-28 DIAGNOSIS — Z720 Tobacco use: Secondary | ICD-10-CM

## 2013-10-28 DIAGNOSIS — Z931 Gastrostomy status: Secondary | ICD-10-CM

## 2013-10-28 DIAGNOSIS — Z510 Encounter for antineoplastic radiation therapy: Secondary | ICD-10-CM | POA: Diagnosis not present

## 2013-10-28 DIAGNOSIS — D638 Anemia in other chronic diseases classified elsewhere: Secondary | ICD-10-CM

## 2013-10-28 DIAGNOSIS — R070 Pain in throat: Secondary | ICD-10-CM

## 2013-10-28 HISTORY — DX: Candidal stomatitis: B37.0

## 2013-10-28 MED ORDER — PALONOSETRON HCL INJECTION 0.25 MG/5ML
INTRAVENOUS | Status: AC
  Filled 2013-10-28: qty 5

## 2013-10-28 MED ORDER — PALONOSETRON HCL INJECTION 0.25 MG/5ML
0.2500 mg | Freq: Once | INTRAVENOUS | Status: AC
  Administered 2013-10-28: 0.25 mg via INTRAVENOUS

## 2013-10-28 MED ORDER — HEPARIN SOD (PORK) LOCK FLUSH 100 UNIT/ML IV SOLN
500.0000 [IU] | Freq: Once | INTRAVENOUS | Status: AC | PRN
  Administered 2013-10-28: 500 [IU]
  Filled 2013-10-28: qty 5

## 2013-10-28 MED ORDER — MANNITOL 25 % IV SOLN
Freq: Once | INTRAVENOUS | Status: AC
  Administered 2013-10-28: 10:00:00 via INTRAVENOUS
  Filled 2013-10-28: qty 10

## 2013-10-28 MED ORDER — SODIUM CHLORIDE 0.9 % IV SOLN
150.0000 mg | Freq: Once | INTRAVENOUS | Status: AC
  Administered 2013-10-28: 150 mg via INTRAVENOUS
  Filled 2013-10-28: qty 5

## 2013-10-28 MED ORDER — CISPLATIN CHEMO INJECTION 100MG/100ML
80.0000 mg/m2 | Freq: Once | INTRAVENOUS | Status: AC
  Administered 2013-10-28: 144 mg via INTRAVENOUS
  Filled 2013-10-28: qty 144

## 2013-10-28 MED ORDER — DEXAMETHASONE SODIUM PHOSPHATE 20 MG/5ML IJ SOLN
INTRAMUSCULAR | Status: AC
  Filled 2013-10-28: qty 5

## 2013-10-28 MED ORDER — SODIUM CHLORIDE 0.9 % IJ SOLN
10.0000 mL | INTRAMUSCULAR | Status: DC | PRN
  Administered 2013-10-28: 10 mL
  Filled 2013-10-28: qty 10

## 2013-10-28 MED ORDER — NYSTATIN 100000 UNIT/ML MT SUSP: 5.0000 mL | Freq: Four times a day (QID) | OROMUCOSAL | Status: DC

## 2013-10-28 MED ORDER — DEXAMETHASONE SODIUM PHOSPHATE 20 MG/5ML IJ SOLN
12.0000 mg | Freq: Once | INTRAMUSCULAR | Status: AC
  Administered 2013-10-28: 12 mg via INTRAVENOUS

## 2013-10-28 NOTE — Progress Notes (Signed)
Ok to treat with ANC 1.1 per MD Alvy Bimler.

## 2013-10-28 NOTE — Progress Notes (Signed)
Ok to treat with low ANC per Dr. Alvy Bimler.

## 2013-10-28 NOTE — Assessment & Plan Note (Signed)
I spent some time counseling the patient the importance of tobacco cessation. He is currently attempting to quit now.

## 2013-10-28 NOTE — Telephone Encounter (Signed)
gv and printed appt sched and avs for pt for OCT. °

## 2013-10-28 NOTE — Assessment & Plan Note (Signed)
He tolerated treatment well apart from mild leukopenia. I will proceed with cycle 3 of therapy without delay. I will reduce the dose by 20% due to leukopenia.

## 2013-10-28 NOTE — Assessment & Plan Note (Signed)
The patient is able to swallow food and I encourage him to eat as much as he can. He is not using his feeding tube yet.

## 2013-10-28 NOTE — Patient Instructions (Signed)
[facility] Discharge Instructions for Patients Receiving Chemotherapy  Today you received the following chemotherapy agents Cisplatin.  To help prevent nausea and vomiting after your treatment, we encourage you to take your nausea medication as directed.    If you develop nausea and vomiting that is not controlled by your nausea medication, call the clinic.   BELOW ARE SYMPTOMS THAT SHOULD BE REPORTED IMMEDIATELY:  *FEVER GREATER THAN 100.5 F  *CHILLS WITH OR WITHOUT FEVER  NAUSEA AND VOMITING THAT IS NOT CONTROLLED WITH YOUR NAUSEA MEDICATION  *UNUSUAL SHORTNESS OF BREATH  *UNUSUAL BRUISING OR BLEEDING  TENDERNESS IN MOUTH AND THROAT WITH OR WITHOUT PRESENCE OF ULCERS  *URINARY PROBLEMS  *BOWEL PROBLEMS  UNUSUAL RASH Items with * indicate a potential emergency and should be followed up as soon as possible.  Feel free to call the clinic you have any questions or concerns. The clinic phone number is [phone].    

## 2013-10-28 NOTE — Assessment & Plan Note (Signed)
I will prescribe nystatin swish and swallow.

## 2013-10-28 NOTE — Assessment & Plan Note (Signed)
I gave him prescription hydromorphone. I want to avoid Tylenol-containing products if possible. We discussed narcotics refill policy. He is not sure about his pain medication. He denies pain.

## 2013-10-28 NOTE — Progress Notes (Signed)
Patient reports he continues to eat well.  Weight remains stable and was documented as 154 pounds.  Patient has been supplementing oral intake with [employer] and Ensure.  He continues to flush PEG daily.  Nutrition diagnosis: Predicted suboptimal energy intake continues.  Intervention: Patient to continue meals and snacks along with oral nutrition supplements to promote weight maintenance. Patient to continue flushing PEG daily with water. Provided one complementary case of Ensure Plus.  Monitoring, evaluation, goals: Patient is tolerating oral intake and has had weight maintenance.  He is using feeding tube only for water flushes.  Next visit: Will continue to follow patient as needed.  **Disclaimer: This note was dictated with voice recognition software. Similar sounding words can inadvertently be transcribed and this note may contain transcription errors which may not have been corrected upon publication of note.**

## 2013-10-28 NOTE — Progress Notes (Signed)
[facility] OFFICE PROGRESS NOTE  Patient Care Team: Provider Not In System as PCP - General Brooks Sailors, RN as Oncology Nurse Navigator (Oncology) Eppie Gibson, MD as Attending Physician (Radiation Oncology) Heath Lark, MD as Consulting Physician (Hematology and Oncology)  SUMMARY OF ONCOLOGIC HISTORY: Oncology History   Malignant neoplasm of supraglottis   Primary site: Larynx - Supraglottis   Staging method: AJCC 7th Edition   Clinical free text: HPV positive   Clinical: Stage IVA (T2, N2, M0) signed by Heath Lark, MD on 10/28/2013  9:15 AM   Summary: Stage IVA (T2, N2, M0)           Malignant neoplasm of supraglottis   08/13/2013 Pathology Results [id number]: 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   08/13/2013 Procedure Laryngoscopy showed the right false vocal fold and the right and midline laryngeal surface of the epiglottis showed a fungating, irregular mass   08/26/2013 Imaging PET scan showed right supraglottic mass with metastatic bilateral level II and III lymph nodes. Resultant severe narrowing of the airway at or just above the vocal folds.    09/15/2013 Procedure He has placement of feeding tube and port.   09/16/2013 - 10/28/2013 Chemotherapy He started on high dose cisplatin chemotherapy with radiation. Last dose of cisplatin was reduced by 20% due to mild leukopenia.   09/16/2013 -  Radiation Therapy He started on daily radiation therapy    INTERVAL HISTORY: Please see below for problem oriented charting. He is employed to cycle 3 of therapy. He complained of mild throat pain. His current pain medicine is controlling it. He has not used his feeding tube for nutritional needs. He is applying topical cream to his neck.  REVIEW OF SYSTEMS:   Constitutional: Denies fevers, chills or abnormal weight loss Eyes: Denies blurriness of vision Respiratory: Denies cough, dyspnea or wheezes Cardiovascular:  Denies palpitation, chest discomfort or lower extremity swelling Gastrointestinal:  Denies nausea, heartburn or change in bowel habits Lymphatics: Denies new lymphadenopathy or easy bruising Neurological:Denies numbness, tingling or new weaknesses Behavioral/Psych: Mood is stable, no new changes  All other systems were reviewed with the patient and are negative.  I have reviewed the past medical history, past surgical history, social history and family history with the patient and they are unchanged from previous note.  ALLERGIES:  has No Known Allergies.  MEDICATIONS:  Current Outpatient Prescriptions  Medication Sig Dispense Refill  . acetaminophen (TYLENOL) 325 MG tablet Take 650 mg by mouth every 6 (six) hours as needed (pain).      . Alum & Mag Hydroxide-Simeth (MAGIC MOUTHWASH) SOLN Swish and Spit 5 ml QID PRN,  30 minutes before eating and at bedtime.  500 mL  5  . aspirin EC 81 MG tablet Take 81 mg by mouth daily.      . cholecalciferol (VITAMIN D) 400 UNITS TABS tablet Take 800 Units by mouth daily.      . cycloSPORINE (RESTASIS) 0.05 % ophthalmic emulsion Place 1 drop into both eyes 2 (two) times daily.      Marland Kitchen docusate sodium (COLACE) 100 MG capsule Take 1 capsule (100 mg total) by mouth 2 (two) times daily. Take once a day if stool gets too soft.  60 capsule  2  . emollient (BIAFINE) cream Apply 1 application topically 2 (two) times daily. Apply after rad txs and bedtime daily and on weekends      . guaifenesin (ROBITUSSIN) 100 MG/5ML syrup Take  200 mg by mouth 3 (three) times daily as needed for cough.      . hydrocerin (EUCERIN) CREA Apply 1 application topically 2 (two) times daily. Spread to bilateral palm twice daily      . HYDROCODONE-ACETAMINOPHEN PO Take 15 mLs by mouth every 4 (four) hours as needed (Hydorcodone 7.5/325mg /44ml oral solution.Pain).       Marland Kitchen HYDROmorphone (DILAUDID) 2 MG tablet Take 1 tablet (2 mg total) by mouth every 4 (four) hours as needed for moderate  pain or severe pain.  30 tablet  0  . lidocaine-prilocaine (EMLA) cream Apply 1 application topically as needed.  30 g  6  . ondansetron (ZOFRAN) 4 MG tablet Take 4 mg by mouth 3 (three) times daily as needed for nausea or vomiting.       Marland Kitchen OVER THE COUNTER MEDICATION Take 237 mLs by mouth 3 (three) times daily. [employer]      . polyethylene glycol (MIRALAX / GLYCOLAX) packet MIX 1 PACKET (17GM) IN SUITABLE LIQUID AND DRINK BY MOUTH EVERY DAY **NO REFILLS**  14 each  10  . promethazine (PHENERGAN) 25 MG tablet Take 1 tablet (25 mg total) by mouth every 6 (six) hours as needed for nausea.  60 tablet  3  . risperiDONE (RISPERDAL) 2 MG tablet Take 2 mg by mouth 2 (two) times daily.      . simvastatin (ZOCOR) 10 MG tablet Take 10 mg by mouth at bedtime.      Marland Kitchen nystatin (MYCOSTATIN) 100000 UNIT/ML suspension Take 5 mLs (500,000 Units total) by mouth 4 (four) times daily.  240 mL  0   No current facility-administered medications for this visit.    PHYSICAL EXAMINATION: ECOG PERFORMANCE STATUS: 1 - Symptomatic but completely ambulatory  Filed Vitals:   10/28/13 0847  BP: 123/70  Pulse: 75  Temp: 97.6 F (36.4 C)  Resp: 18   Filed Weights   10/28/13 0847  Weight: 154 lb 1.6 oz (69.899 kg)    GENERAL:alert, no distress and comfortable SKIN: No radiation-induced skin injury around his neck. No ulceration.  EYES: normal, Conjunctiva are pink and non-injected, sclera clear OROPHARYNX: No mucositis and early signs of thrush. No ulceration.  NECK: supple, thyroid normal size, non-tender, without nodularity LYMPH:  no palpable lymphadenopathy in the cervical, axillary or inguinal LUNGS: clear to auscultation and percussion with normal breathing effort HEART: regular rate & rhythm and no murmurs and no lower extremity edema ABDOMEN:abdomen soft, non-tender and normal bowel sounds. Feeding tube site looks okay Musculoskeletal:no cyanosis of digits and no clubbing  NEURO: alert & oriented x 3  with fluent speech, no focal motor/sensory deficits  LABORATORY DATA:  I have reviewed the data as listed    Component Value Date/Time   NA 136 10/27/2013 0919   NA 137 09/15/2013 0940   K 4.0 10/27/2013 0919   K 3.9 09/15/2013 0940   CL 99 09/15/2013 0940   CO2 30* 10/27/2013 0919   CO2 28 09/15/2013 0940   GLUCOSE 83 10/27/2013 0919   GLUCOSE 112* 09/15/2013 0940   BUN 12.3 10/27/2013 0919   BUN 9 09/15/2013 0940   CREATININE 0.8 10/27/2013 0919   CREATININE 0.62 09/15/2013 0940   CALCIUM 9.1 10/27/2013 0919   CALCIUM 10.0 09/15/2013 0940   PROT 6.1* 10/27/2013 0919   PROT 6.5 09/07/2013 1430   ALBUMIN 3.2* 10/27/2013 0919   ALBUMIN 3.3* 09/07/2013 1430   AST 9 10/27/2013 0919   AST 11 09/07/2013 1430   ALT <  6 10/27/2013 0919   ALT 7 09/07/2013 1430   ALKPHOS 58 10/27/2013 0919   ALKPHOS 92 09/07/2013 1430   BILITOT 0.27 10/27/2013 0919   BILITOT <0.2* 09/07/2013 1430   GFRNONAA >90 09/15/2013 0940   GFRAA >90 09/15/2013 0940    No results found for this basename: SPEP, UPEP,  kappa and lambda light chains    Lab Results  Component Value Date   WBC 2.2* 10/27/2013   NEUTROABS 1.1* 10/27/2013   HGB 10.6* 10/27/2013   HCT 31.7* 10/27/2013   MCV 93.2 10/27/2013   PLT 115* 10/27/2013      Chemistry      Component Value Date/Time   NA 136 10/27/2013 0919   NA 137 09/15/2013 0940   K 4.0 10/27/2013 0919   K 3.9 09/15/2013 0940   CL 99 09/15/2013 0940   CO2 30* 10/27/2013 0919   CO2 28 09/15/2013 0940   BUN 12.3 10/27/2013 0919   BUN 9 09/15/2013 0940   CREATININE 0.8 10/27/2013 0919   CREATININE 0.62 09/15/2013 0940      Component Value Date/Time   CALCIUM 9.1 10/27/2013 0919   CALCIUM 10.0 09/15/2013 0940   ALKPHOS 58 10/27/2013 0919   ALKPHOS 92 09/07/2013 1430   AST 9 10/27/2013 0919   AST 11 09/07/2013 1430   ALT <6 10/27/2013 0919   ALT 7 09/07/2013 1430   BILITOT 0.27 10/27/2013 0919   BILITOT <0.2* 09/07/2013 1430      ASSESSMENT & PLAN:  Malignant neoplasm of supraglottis He tolerated  treatment well apart from mild leukopenia. I will proceed with cycle 3 of therapy without delay. I will reduce the dose by 20% due to leukopenia.  Tobacco abuse I spent some time counseling the patient the importance of tobacco cessation. He is currently attempting to quit now.  Anemia of other chronic disease This is likely anemia of chronic disease and related to recent therapy. The patient denies recent history of bleeding such as epistaxis, hematuria or hematochezia. He is asymptomatic from the anemia. We will observe for now.       Throat pain I gave him prescription hydromorphone. I want to avoid Tylenol-containing products if possible. We discussed narcotics refill policy. He is not sure about his pain medication. He denies pain.   S/P gastrostomy His feeding tube site looks okay without signs of infection.   Protein calorie malnutrition The patient is able to swallow food and I encourage him to eat as much as he can. He is not using his feeding tube yet.  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 He will continue using topical emollient cream.  Thrush I will prescribe nystatin swish and swallow.   No orders of the defined types were placed in this encounter.   All questions were answered. The patient knows to call the clinic with any problems, questions or concerns. No barriers to learning was detected. I spent 30 minutes counseling the patient face to face. The total time spent in the appointment was 40 minutes and more than 50% was on counseling and review of test results     [facility], [place], MD 10/28/2013 9:18 AM

## 2013-10-28 NOTE — Assessment & Plan Note (Signed)
His feeding tube site looks okay without signs of infection.

## 2013-10-28 NOTE — Progress Notes (Signed)
Ok to continue with chemotherapy treatment per Dr. Alvy Bimler with urine output of 175 ml.

## 2013-10-28 NOTE — Assessment & Plan Note (Signed)
This is likely anemia of chronic disease and related to recent therapy. The patient denies recent history of bleeding such as epistaxis, hematuria or hematochezia. He is asymptomatic from the anemia. We will observe for now.

## 2013-10-28 NOTE — Assessment & Plan Note (Signed)
There is no signs of skin ulceration. He will continue using topical emollient cream.

## 2013-10-29 ENCOUNTER — Ambulatory Visit: Payer: Medicare Other

## 2013-10-29 ENCOUNTER — Ambulatory Visit
Admission: RE | Admit: 2013-10-29 | Discharge: 2013-10-29 | Disposition: A | Discharge status: Home / Self Care | Payer: Medicare Other | Source: Ambulatory Visit | Attending: Radiation Oncology | Admitting: Radiation Oncology

## 2013-10-29 DIAGNOSIS — Z510 Encounter for antineoplastic radiation therapy: Secondary | ICD-10-CM | POA: Diagnosis present

## 2013-10-29 DIAGNOSIS — R234 Changes in skin texture: Secondary | ICD-10-CM | POA: Insufficient documentation

## 2013-10-29 DIAGNOSIS — E46 Unspecified protein-calorie malnutrition: Secondary | ICD-10-CM | POA: Diagnosis not present

## 2013-10-29 DIAGNOSIS — Z720 Tobacco use: Secondary | ICD-10-CM | POA: Diagnosis not present

## 2013-10-29 DIAGNOSIS — D630 Anemia in neoplastic disease: Secondary | ICD-10-CM | POA: Insufficient documentation

## 2013-10-29 DIAGNOSIS — C321 Malignant neoplasm of supraglottis: Secondary | ICD-10-CM | POA: Insufficient documentation

## 2013-10-29 DIAGNOSIS — Z931 Gastrostomy status: Secondary | ICD-10-CM | POA: Diagnosis not present

## 2013-10-29 DIAGNOSIS — L598 Other specified disorders of the skin and subcutaneous tissue related to radiation: Secondary | ICD-10-CM | POA: Insufficient documentation

## 2013-10-30 ENCOUNTER — Ambulatory Visit: Payer: Medicare Other

## 2013-10-30 ENCOUNTER — Ambulatory Visit
Admission: RE | Admit: 2013-10-30 | Discharge: 2013-10-30 | Disposition: A | Discharge status: Home / Self Care | Payer: Medicare Other | Source: Ambulatory Visit | Attending: Radiation Oncology | Admitting: Radiation Oncology

## 2013-10-30 DIAGNOSIS — Z510 Encounter for antineoplastic radiation therapy: Secondary | ICD-10-CM | POA: Diagnosis not present

## 2013-11-02 ENCOUNTER — Ambulatory Visit
Admission: RE | Admit: 2013-11-02 | Discharge: 2013-11-02 | Disposition: A | Discharge status: Home / Self Care | Payer: Medicare Other | Source: Ambulatory Visit | Attending: Radiation Oncology | Admitting: Radiation Oncology

## 2013-11-02 VITALS — BP 121/70 | HR 80 | Temp 98.1°F | Resp 16 | Wt 155.5 lb

## 2013-11-02 DIAGNOSIS — Z510 Encounter for antineoplastic radiation therapy: Secondary | ICD-10-CM | POA: Diagnosis not present

## 2013-11-02 DIAGNOSIS — C321 Malignant neoplasm of supraglottis: Secondary | ICD-10-CM

## 2013-11-02 NOTE — Progress Notes (Signed)
He is currently in no pain. Pt complains of, Fatigue and Generalized Weakness.  Pt presenting appropriate quality, quantity and organization of sentences, loud. Pt reports nothing has become stuck, but feels a lump sensation when swallowing. The patient eats a regular, healthy diet.. Oral exam reveals mucous membranes moist, pharynx normal without lesions. Noted some hyperpigmentation and peeling crusty scabs over neck/clavicle. Reports hes having BM approx qd.

## 2013-11-02 NOTE — Progress Notes (Signed)
   Weekly Management Note:  outpatient    ICD-9-CM ICD-10-CM  1. 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  CBCT/MVCT images/Port film x-rays were reviewed.  The chart was checked. He is currently in no pain. Feels a lump sensation when swallowing. The patient eats a regular, healthy diet..  Reports hes having BM approx qd   Physical Findings:  weight is 155 lb 8 oz (70.534 kg). His oral temperature is 98.1 F (36.7 C). His blood pressure is 121/70 and his pulse is 80. His respiration is 16 and oxygen saturation is 99%.  Oral exam reveals mucous membranes moist, oropharynx erythematous. + hyperpigmentation and peeling crusty scabs over neck/clavicle. CBC    Component Value Date/Time   WBC 2.2* 10/27/2013 0919   WBC 9.1 09/15/2013 0940   RBC 3.40* 10/27/2013 0919   RBC 4.37 09/15/2013 0940   HGB 10.6* 10/27/2013 0919   HGB 13.0 09/15/2013 0940   HCT 31.7* 10/27/2013 0919   HCT 38.2* 09/15/2013 0940   PLT 115* 10/27/2013 0919   PLT 232 09/15/2013 0940   MCV 93.2 10/27/2013 0919   MCV 87.4 09/15/2013 0940   MCH 31.1 10/27/2013 0919   MCH 29.7 09/15/2013 0940   MCHC 33.4 10/27/2013 0919   MCHC 34.0 09/15/2013 0940   RDW 18.2* 10/27/2013 0919   RDW 13.8 09/15/2013 0940   LYMPHSABS 0.5* 10/27/2013 0919   LYMPHSABS 1.8 09/15/2013 0940   MONOABS 0.5 10/27/2013 0919   MONOABS 0.8 09/15/2013 0940   EOSABS 0.1 10/27/2013 0919   EOSABS 0.2 09/15/2013 0940   BASOSABS 0.0 10/27/2013 0919   BASOSABS 0.0 09/15/2013 0940     CMP     Component Value Date/Time   NA 136 10/27/2013 0919   NA 137 09/15/2013 0940   K 4.0 10/27/2013 0919   K 3.9 09/15/2013 0940   CL 99 09/15/2013 0940   CO2 30* 10/27/2013 0919   CO2 28 09/15/2013 0940   GLUCOSE 83 10/27/2013 0919   GLUCOSE 112* 09/15/2013 0940   BUN 12.3 10/27/2013 0919   BUN 9 09/15/2013 0940   CREATININE 0.8 10/27/2013 0919   CREATININE  0.62 09/15/2013 0940   CALCIUM 9.1 10/27/2013 0919   CALCIUM 10.0 09/15/2013 0940   PROT 6.1* 10/27/2013 0919   PROT 6.5 09/07/2013 1430   ALBUMIN 3.2* 10/27/2013 0919   ALBUMIN 3.3* 09/07/2013 1430   AST 9 10/27/2013 0919   AST 11 09/07/2013 1430   ALT <6 10/27/2013 0919   ALT 7 09/07/2013 1430   ALKPHOS 58 10/27/2013 0919   ALKPHOS 92 09/07/2013 1430   BILITOT 0.27 10/27/2013 0919   BILITOT <0.2* 09/07/2013 1430   GFRNONAA >90 09/15/2013 0940   GFRAA >90 09/15/2013 0940     Impression:  The patient is tolerating radiotherapy.   Plan:  Continue radiotherapy as planned.Of note, he is trying to cut down on smoking.   The patient was counseled to ideally, eventually stop using tobacco and was offered pharmacotherapy and further counseling to help with this. The patient declined pharmacotherapy and at this time. Declined nicotine patches.  -----------------------------------  Eppie Gibson, MD

## 2013-11-03 ENCOUNTER — Encounter: Payer: Self-pay | Admitting: *Deleted

## 2013-11-03 ENCOUNTER — Ambulatory Visit
Admission: RE | Admit: 2013-11-03 | Discharge: 2013-11-03 | Disposition: A | Discharge status: Home / Self Care | Payer: Medicare Other | Source: Ambulatory Visit | Attending: Radiation Oncology | Admitting: Radiation Oncology

## 2013-11-03 ENCOUNTER — Telehealth: Payer: Self-pay | Admitting: *Deleted

## 2013-11-03 DIAGNOSIS — Z510 Encounter for antineoplastic radiation therapy: Secondary | ICD-10-CM | POA: Diagnosis not present

## 2013-11-03 NOTE — Telephone Encounter (Signed)
Entry error

## 2013-11-03 NOTE — Progress Notes (Signed)
Patient's transporter, Ivin Booty, reported this morning that [facility] misplaced Rx for nystatin (MYCOSTATIN) 100000 UNIT/ML suspension issued by Dr. Alvy Bimler last week.  She asked that Rx be re-issued and confirmed that e-scripting to [facility], [place] (pharmacy noted in [facility]) is best route.  Dr. Alvy Bimler notified.  Gayleen Orem, RN, BSN, [place] at [place] [phone]

## 2013-11-04 ENCOUNTER — Telehealth: Payer: Self-pay | Admitting: *Deleted

## 2013-11-04 ENCOUNTER — Telehealth: Payer: Self-pay | Admitting: Hematology and Oncology

## 2013-11-04 ENCOUNTER — Ambulatory Visit
Admission: RE | Admit: 2013-11-04 | Discharge: 2013-11-04 | Disposition: A | Discharge status: Home / Self Care | Payer: Medicare Other | Source: Ambulatory Visit | Attending: Radiation Oncology | Admitting: Radiation Oncology

## 2013-11-04 DIAGNOSIS — Z510 Encounter for antineoplastic radiation therapy: Secondary | ICD-10-CM | POA: Diagnosis not present

## 2013-11-04 MED ORDER — NYSTATIN 100000 UNIT/ML MT SUSP: 5.0000 mL | Freq: Four times a day (QID) | OROMUCOSAL | Status: DC

## 2013-11-04 NOTE — Telephone Encounter (Signed)
advised on nut appt....ok and aware

## 2013-11-04 NOTE — Telephone Encounter (Signed)
[facility] cannot find Rx for Nystatin.  Re sent to Pharmacy electronically.

## 2013-11-05 ENCOUNTER — Other Ambulatory Visit: Payer: Self-pay

## 2013-11-05 ENCOUNTER — Ambulatory Visit
Admission: RE | Admit: 2013-11-05 | Discharge: 2013-11-05 | Disposition: A | Discharge status: Home / Self Care | Payer: Medicare Other | Source: Ambulatory Visit | Attending: Radiation Oncology | Admitting: Radiation Oncology

## 2013-11-05 ENCOUNTER — Ambulatory Visit: Payer: Self-pay | Admitting: Hematology and Oncology

## 2013-11-05 DIAGNOSIS — Z510 Encounter for antineoplastic radiation therapy: Secondary | ICD-10-CM | POA: Diagnosis not present

## 2013-11-06 ENCOUNTER — Telehealth: Payer: Self-pay | Admitting: Hematology and Oncology

## 2013-11-06 ENCOUNTER — Other Ambulatory Visit (HOSPITAL_BASED_OUTPATIENT_CLINIC_OR_DEPARTMENT_OTHER): Payer: Medicare Other

## 2013-11-06 ENCOUNTER — Other Ambulatory Visit: Payer: Self-pay | Admitting: *Deleted

## 2013-11-06 ENCOUNTER — Ambulatory Visit (HOSPITAL_BASED_OUTPATIENT_CLINIC_OR_DEPARTMENT_OTHER): Payer: Medicare Other | Admitting: Hematology and Oncology

## 2013-11-06 ENCOUNTER — Other Ambulatory Visit: Payer: Self-pay | Admitting: Hematology and Oncology

## 2013-11-06 ENCOUNTER — Encounter: Payer: Self-pay | Admitting: *Deleted

## 2013-11-06 ENCOUNTER — Ambulatory Visit
Admission: RE | Admit: 2013-11-06 | Discharge: 2013-11-06 | Disposition: A | Discharge status: Home / Self Care | Payer: Medicare Other | Source: Ambulatory Visit | Attending: Radiation Oncology | Admitting: Radiation Oncology

## 2013-11-06 ENCOUNTER — Encounter: Payer: Self-pay | Admitting: Hematology and Oncology

## 2013-11-06 VITALS — BP 130/74 | HR 79 | Temp 98.2°F | Resp 17 | Ht 70.5 in | Wt 151.6 lb

## 2013-11-06 DIAGNOSIS — E46 Unspecified protein-calorie malnutrition: Secondary | ICD-10-CM

## 2013-11-06 DIAGNOSIS — Z510 Encounter for antineoplastic radiation therapy: Secondary | ICD-10-CM | POA: Diagnosis not present

## 2013-11-06 DIAGNOSIS — Z931 Gastrostomy status: Secondary | ICD-10-CM

## 2013-11-06 DIAGNOSIS — D630 Anemia in neoplastic disease: Secondary | ICD-10-CM

## 2013-11-06 DIAGNOSIS — B370 Candidal stomatitis: Secondary | ICD-10-CM

## 2013-11-06 DIAGNOSIS — C321 Malignant neoplasm of supraglottis: Secondary | ICD-10-CM

## 2013-11-06 DIAGNOSIS — L589 Radiodermatitis, unspecified: Secondary | ICD-10-CM

## 2013-11-06 DIAGNOSIS — R070 Pain in throat: Secondary | ICD-10-CM

## 2013-11-06 DIAGNOSIS — Z720 Tobacco use: Secondary | ICD-10-CM

## 2013-11-06 LAB — COMPREHENSIVE METABOLIC PANEL (CC13)
ALBUMIN: 3.5 g/dL (ref 3.5–5.0)
ALT: 9 U/L (ref 0–55)
ANION GAP: 4 meq/L (ref 3–11)
AST: 11 U/L (ref 5–34)
Alkaline Phosphatase: 64 U/L (ref 40–150)
BUN: 14 mg/dL (ref 7.0–26.0)
CALCIUM: 9.4 mg/dL (ref 8.4–10.4)
CHLORIDE: 99 meq/L (ref 98–109)
CO2: 30 meq/L — AB (ref 22–29)
Creatinine: 0.8 mg/dL (ref 0.7–1.3)
Glucose: 86 mg/dl (ref 70–140)
POTASSIUM: 4.2 meq/L (ref 3.5–5.1)
Sodium: 133 mEq/L — ABNORMAL LOW (ref 136–145)
TOTAL PROTEIN: 6.5 g/dL (ref 6.4–8.3)
Total Bilirubin: 0.26 mg/dL (ref 0.20–1.20)

## 2013-11-06 LAB — CBC WITH DIFFERENTIAL/PLATELET
BASO%: 0.3 % (ref 0.0–2.0)
Basophils Absolute: 0 10*3/uL (ref 0.0–0.1)
EOS%: 1.1 % (ref 0.0–7.0)
Eosinophils Absolute: 0 10*3/uL (ref 0.0–0.5)
HEMATOCRIT: 31.9 % — AB (ref 38.4–49.9)
HEMOGLOBIN: 10.6 g/dL — AB (ref 13.0–17.1)
LYMPH%: 10.5 % — ABNORMAL LOW (ref 14.0–49.0)
MCH: 31.1 pg (ref 27.2–33.4)
MCHC: 33.2 g/dL (ref 32.0–36.0)
MCV: 93.4 fL (ref 79.3–98.0)
MONO#: 0.6 10*3/uL (ref 0.1–0.9)
MONO%: 17.7 % — ABNORMAL HIGH (ref 0.0–14.0)
NEUT#: 2.2 10*3/uL (ref 1.5–6.5)
NEUT%: 70.4 % (ref 39.0–75.0)
Platelets: 114 10*3/uL — ABNORMAL LOW (ref 140–400)
RBC: 3.42 10*6/uL — ABNORMAL LOW (ref 4.20–5.82)
RDW: 17.9 % — AB (ref 11.0–14.6)
WBC: 3.2 10*3/uL — AB (ref 4.0–10.3)
lymph#: 0.3 10*3/uL — ABNORMAL LOW (ref 0.9–3.3)

## 2013-11-06 LAB — MAGNESIUM (CC13): Magnesium: 2.1 mg/dl (ref 1.5–2.5)

## 2013-11-06 NOTE — Assessment & Plan Note (Signed)
I spent some time counseling the patient the importance of tobacco cessation. He is currently attempting to quit now.

## 2013-11-06 NOTE — Telephone Encounter (Signed)
gv and printed appt sched and avs for pt for OCT. °

## 2013-11-06 NOTE — Assessment & Plan Note (Signed)
His feeding tube site looks okay without signs of infection.

## 2013-11-06 NOTE — Assessment & Plan Note (Signed)
This has resolved.

## 2013-11-06 NOTE — Assessment & Plan Note (Signed)
This is likely due to recent treatment. The patient denies recent history of bleeding such as epistaxis, hematuria or hematochezia. He is asymptomatic from the anemia. I will observe for now.  He does not require transfusion now. 

## 2013-11-06 NOTE — Assessment & Plan Note (Signed)
The patient is able to swallow food and I encourage him to eat as much as he can. He is not using his feeding tube yet.

## 2013-11-06 NOTE — Assessment & Plan Note (Signed)
He tolerated treatment well apart from minor side effects. We'll continue supportive care.

## 2013-11-06 NOTE — Assessment & Plan Note (Signed)
There is no signs of skin ulceration. He will continue using topical emollient cream.

## 2013-11-06 NOTE — Progress Notes (Signed)
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   08/13/2013 Procedure Laryngoscopy showed the right false vocal fold and the right and midline laryngeal surface of the epiglottis showed a fungating, irregular mass   08/26/2013 Imaging PET scan showed right supraglottic mass with metastatic bilateral level II and III lymph nodes. Resultant severe narrowing of the airway at or just above the vocal folds.    09/15/2013 Procedure He has placement of feeding tube and port.   09/16/2013 - 10/28/2013 Chemotherapy He started on high dose cisplatin chemotherapy with radiation. Last dose of cisplatin was reduced by 20% due to mild leukopenia.   09/16/2013 -  Radiation Therapy He started on daily radiation therapy    INTERVAL HISTORY: Please see below for problem oriented charting. He is seen as part of his weekly follow-up. He is doing well apart from mild sore throat.  REVIEW OF SYSTEMS:   Constitutional: Denies fevers, chills or abnormal weight loss Eyes: Denies blurriness of vision Respiratory: Denies cough, dyspnea or wheezes Cardiovascular: Denies palpitation, chest discomfort or lower extremity swelling Gastrointestinal:  Denies nausea, heartburn or change in bowel  habits Skin: has mild radiation dermatitis Lymphatics: Denies new lymphadenopathy or easy bruising Neurological:Denies numbness, tingling or new weaknesses Behavioral/Psych: Mood is stable, no new changes  All other systems were reviewed with the patient and are negative.  I have reviewed the past medical history, past surgical history, social history and family history with the patient and they are unchanged from previous note.  ALLERGIES:  has No Known Allergies.  MEDICATIONS:  Current Outpatient Prescriptions  Medication Sig Dispense Refill  . acetaminophen (TYLENOL) 325 MG tablet Take 650 mg by mouth every 6 (six) hours as needed (pain).      . Alum & Mag Hydroxide-Simeth (MAGIC MOUTHWASH) SOLN Swish and Spit 5 ml QID PRN,  30 minutes before eating and at bedtime.  500 mL  5  . aspirin EC 81 MG tablet Take 81 mg by mouth daily.      . cholecalciferol (VITAMIN D) 400 UNITS TABS tablet Take 800 Units by mouth daily.      . cycloSPORINE (RESTASIS) 0.05 % ophthalmic emulsion Place 1 drop into both eyes 2 (two) times daily.      Marland Kitchen docusate sodium (COLACE) 100 MG capsule Take 1 capsule (100 mg total) by mouth 2 (two) times daily. Take once a day if stool gets too soft.  60 capsule  2  . emollient (BIAFINE) cream Apply 1 application topically 2 (two) times daily. Apply after rad txs and bedtime daily and on weekends      . guaifenesin (ROBITUSSIN) 100 MG/5ML syrup Take 200 mg by mouth 3 (three) times daily as needed for cough.      Marland Kitchen  hydrocerin (EUCERIN) CREA Apply 1 application topically 2 (two) times daily. Spread to bilateral palm twice daily      . HYDROCODONE-ACETAMINOPHEN PO Take 15 mLs by mouth every 4 (four) hours as needed (Hydorcodone 7.5/325mg /25ml oral solution.Pain).       Marland Kitchen HYDROmorphone (DILAUDID) 2 MG tablet Take 1 tablet (2 mg total) by mouth every 4 (four) hours as needed for moderate pain or severe pain.  30 tablet  0  . lidocaine-prilocaine (EMLA) cream Apply 1 application  topically as needed.  30 g  6  . nystatin (MYCOSTATIN) 100000 UNIT/ML suspension Take 5 mLs (500,000 Units total) by mouth 4 (four) times daily.  240 mL  0  . ondansetron (ZOFRAN) 4 MG tablet Take 4 mg by mouth 3 (three) times daily as needed for nausea or vomiting.       Marland Kitchen OVER THE COUNTER MEDICATION Take 237 mLs by mouth 3 (three) times daily. [employer]      . polyethylene glycol (MIRALAX / GLYCOLAX) packet MIX 1 PACKET (17GM) IN SUITABLE LIQUID AND DRINK BY MOUTH EVERY DAY **NO REFILLS**  14 each  10  . promethazine (PHENERGAN) 25 MG tablet Take 1 tablet (25 mg total) by mouth every 6 (six) hours as needed for nausea.  60 tablet  3  . risperiDONE (RISPERDAL) 2 MG tablet Take 2 mg by mouth 2 (two) times daily.      . simvastatin (ZOCOR) 10 MG tablet Take 10 mg by mouth at bedtime.       No current facility-administered medications for this visit.    PHYSICAL EXAMINATION: ECOG PERFORMANCE STATUS: 1 - Symptomatic but completely ambulatory  Filed Vitals:   11/06/13 1258  BP: 130/74  Pulse: 79  Temp: 98.2 F (36.8 C)  Resp: 17   Filed Weights   11/06/13 1258  Weight: 151 lb 9.6 oz (68.765 kg)    GENERAL:alert, no distress and comfortable SKIN: mild radiation dermatitis is noted EYES: normal, Conjunctiva are pink and non-injected, sclera clear OROPHARYNX:noted mucositis, no thrush  NECK: supple, thyroid normal size, non-tender, without nodularity LYMPH:  no palpable lymphadenopathy in the cervical, axillary or inguinal LUNGS: clear to auscultation and percussion with normal breathing effort HEART: regular rate & rhythm and no murmurs and no lower extremity edema ABDOMEN:abdomen soft, non-tender and normal bowel sounds. Feeding tube site looks OK Musculoskeletal:no cyanosis of digits and no clubbing  NEURO: alert & oriented x 3 with fluent speech, no focal motor/sensory deficits  LABORATORY DATA:  I have reviewed the data as listed    Component Value Date/Time   NA 133*  11/06/2013 1242   NA 137 09/15/2013 0940   K 4.2 11/06/2013 1242   K 3.9 09/15/2013 0940   CL 99 09/15/2013 0940   CO2 30* 11/06/2013 1242   CO2 28 09/15/2013 0940   GLUCOSE 86 11/06/2013 1242   GLUCOSE 112* 09/15/2013 0940   BUN 14.0 11/06/2013 1242   BUN 9 09/15/2013 0940   CREATININE 0.8 11/06/2013 1242   CREATININE 0.62 09/15/2013 0940   CALCIUM 9.4 11/06/2013 1242   CALCIUM 10.0 09/15/2013 0940   PROT 6.5 11/06/2013 1242   PROT 6.5 09/07/2013 1430   ALBUMIN 3.5 11/06/2013 1242   ALBUMIN 3.3* 09/07/2013 1430   AST 11 11/06/2013 1242   AST 11 09/07/2013 1430   ALT 9 11/06/2013 1242   ALT 7 09/07/2013 1430   ALKPHOS 64 11/06/2013 1242   ALKPHOS 92 09/07/2013 1430   BILITOT 0.26 11/06/2013 1242  BILITOT <0.2* 09/07/2013 1430   GFRNONAA >90 09/15/2013 0940   GFRAA >90 09/15/2013 0940    No results found for this basename: SPEP, UPEP,  kappa and lambda light chains    Lab Results  Component Value Date   WBC 3.2* 11/06/2013   NEUTROABS 2.2 11/06/2013   HGB 10.6* 11/06/2013   HCT 31.9* 11/06/2013   MCV 93.4 11/06/2013   PLT 114* 11/06/2013      Chemistry      Component Value Date/Time   NA 133* 11/06/2013 1242   NA 137 09/15/2013 0940   K 4.2 11/06/2013 1242   K 3.9 09/15/2013 0940   CL 99 09/15/2013 0940   CO2 30* 11/06/2013 1242   CO2 28 09/15/2013 0940   BUN 14.0 11/06/2013 1242   BUN 9 09/15/2013 0940   CREATININE 0.8 11/06/2013 1242   CREATININE 0.62 09/15/2013 0940      Component Value Date/Time   CALCIUM 9.4 11/06/2013 1242   CALCIUM 10.0 09/15/2013 0940   ALKPHOS 64 11/06/2013 1242   ALKPHOS 92 09/07/2013 1430   AST 11 11/06/2013 1242   AST 11 09/07/2013 1430   ALT 9 11/06/2013 1242   ALT 7 09/07/2013 1430   BILITOT 0.26 11/06/2013 1242   BILITOT <0.2* 09/07/2013 1430      ASSESSMENT & PLAN:  Malignant neoplasm of supraglottis He tolerated treatment well apart from minor side effects. We'll continue supportive care.    Tobacco abuse I spent some time counseling the patient the  importance of tobacco cessation. He is currently attempting to quit now.   Throat pain We discussed narcotics refill policy. He is not sure about his pain medication.  His pain appeared to be well controlled.     S/P gastrostomy His feeding tube site looks okay without signs of infection.  Protein calorie malnutrition The patient is able to swallow food and I encourage him to eat as much as he can. He is not using his feeding tube yet.  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 He will continue using topical emollient cream.  Thrush This has resolved.  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 The patient denies recent history of bleeding such as epistaxis, hematuria or hematochezia. He is asymptomatic from the anemia. I will observe for now.  He does not require transfusion now.   No orders of the defined types were placed in this encounter.   All questions were answered. The patient knows to call the clinic with any problems, questions or concerns. No barriers to learning was detected. I spent 30 minutes counseling the patient face to face. The total time spent in the appointment was 40 minutes and more than 50% was on counseling and review of test results     [facility], [place], MD 11/06/2013 2:56 PM

## 2013-11-06 NOTE — Assessment & Plan Note (Addendum)
We discussed narcotics refill policy. He is not sure about his pain medication.  His pain appeared to be well controlled.

## 2013-11-08 NOTE — Progress Notes (Signed)
To insure pt did not miss appts, met him during [facility] tmt, escorted to Lab, joined him during appt with Dr. Alvy Bimler.  I provided his driver an Epic schedule of patient's upcoming appts.  Patient expressed excitement re: final RT on Monday.  Gayleen Orem, RN, BSN, [facility] [place] [phone]

## 2013-11-09 ENCOUNTER — Encounter: Payer: Self-pay | Admitting: Radiation Oncology

## 2013-11-09 ENCOUNTER — Ambulatory Visit
Admission: RE | Admit: 2013-11-09 | Discharge: 2013-11-09 | Disposition: A | Discharge status: Home / Self Care | Payer: Medicare Other | Source: Ambulatory Visit | Attending: Radiation Oncology | Admitting: Radiation Oncology

## 2013-11-09 ENCOUNTER — Inpatient Hospital Stay
Admission: RE | Admit: 2013-11-09 | Discharge: 2013-11-09 | Disposition: A | Discharge status: Home / Self Care | Payer: Medicare Other | Source: Ambulatory Visit | Attending: Radiation Oncology | Admitting: Radiation Oncology

## 2013-11-09 VITALS — BP 129/80 | HR 90 | Temp 98.3°F | Resp 20 | Wt 154.8 lb

## 2013-11-09 DIAGNOSIS — C321 Malignant neoplasm of supraglottis: Secondary | ICD-10-CM

## 2013-11-09 DIAGNOSIS — Z510 Encounter for antineoplastic radiation therapy: Secondary | ICD-10-CM | POA: Diagnosis not present

## 2013-11-09 MED ORDER — BIAFINE EX EMUL
Freq: Every day | CUTANEOUS | Status: DC
  Administered 2013-11-09: 14:00:00 via TOPICAL

## 2013-11-09 NOTE — Progress Notes (Signed)
Weekly rad txs 35/35 supraglottic , erythema on b/l neck,dryness on right side, coughing congested ,white sputum, still eating  Solid foods, pizza, french fries, choc cakes, fried okra, fried chicken stated, drinks diet coke, , still has diff iculty swallowing but chews up his food real well stated, flushes his peg tube daily and sometimes 2x day, drinks ensure  2 daily also, gave another biafine cream to continue using for a coulple more weeks, f/u appt card given to pt  2:23 PM

## 2013-11-09 NOTE — Progress Notes (Signed)
Weekly Management Note:  Site: Supraglottic larynx/bilateral neck Current Dose:  7000  cGy Projected Dose: 7000  cGy  Narrative: The patient is seen today for routine under treatment assessment. CBCT/MVCT images/port films were reviewed. The chart was reviewed.   He does report thickened secretions with throat discomfort. He is getting all of his calories by mouth. Odynophagia is minimal. He flushes his PEG tube twice a day. He drinks 2 cans of Ensure a day. His weight is up 3 pounds over the past week.  Physical Examination:  Filed Vitals:   11/09/13 1400  BP: 129/80  Pulse: 90  Temp: 98.3 F (36.8 C)  Resp: 20  .  Weight: 154 lb 12.8 oz (70.217 kg). On inspection of the neck there is hyperpigmentation and patchy dry desquamation. There is no palpable adenopathy. On inspection of the oral cavity/oropharynx there is thickening of his secretions with no evidence for candidiasis.  Laboratory data: Lab Results  Component Value Date   WBC 3.2* 11/06/2013   HGB 10.6* 11/06/2013   HCT 31.9* 11/06/2013   MCV 93.4 11/06/2013   PLT 114* 11/06/2013     Impression: Tolerating radiation therapy well. Radiation therapy completed.  Plan: Followup with Dr. Isidore Moos in one month

## 2013-11-13 ENCOUNTER — Other Ambulatory Visit: Payer: Self-pay | Admitting: Hematology and Oncology

## 2013-11-14 NOTE — Progress Notes (Signed)
  Radiation Oncology         [phone] ________________________________  Name: Andrew Osborn MRN: [id number]  Date: 11/09/2013  DOB: [birth year]  End of Treatment Note  Diagnosis:   T2N2cM0 Supraglottic Squamous Cell Carcinoma, STAGE IVA    Indication for treatment:  Curative, with chemotherapy       Radiation treatment dates:   09/16/2013-11/09/13  SITE/DOSE: supraglottis and bilateral neck / 70 Gy in 35 fractions to gross disease, 63 Gy in 35 fractions to high risk nodal echelons, and 56 Gy in 35 fractions to intermediate risk nodal echelons  Beams/energy:   Helical IMRT / 6 MV photons  Narrative: The patient tolerated radiation treatment relatively well with minimal complaints and good maintenance of nutrition.  Plan: The patient has completed radiation treatment. The patient will return to radiation oncology clinic for routine followup in one half month. I advised them to call or return sooner if they have any questions or concerns related to their recovery or treatment.  -----------------------------------  Eppie Gibson, MD

## 2013-11-16 ENCOUNTER — Ambulatory Visit: Payer: Self-pay | Admitting: Hematology and Oncology

## 2013-11-16 ENCOUNTER — Other Ambulatory Visit: Payer: Self-pay

## 2013-11-20 ENCOUNTER — Encounter: Payer: Self-pay | Admitting: Radiation Oncology

## 2013-11-23 ENCOUNTER — Encounter: Payer: Self-pay | Admitting: Radiation Oncology

## 2013-11-23 ENCOUNTER — Ambulatory Visit
Admission: RE | Admit: 2013-11-23 | Discharge: 2013-11-23 | Disposition: A | Discharge status: Home / Self Care | Payer: Medicare Other | Source: Ambulatory Visit | Attending: Radiation Oncology | Admitting: Radiation Oncology

## 2013-11-23 ENCOUNTER — Telehealth: Payer: Self-pay | Admitting: *Deleted

## 2013-11-23 VITALS — BP 111/54 | HR 80 | Temp 98.0°F | Resp 24 | Wt 155.7 lb

## 2013-11-23 DIAGNOSIS — Z7982 Long term (current) use of aspirin: Secondary | ICD-10-CM | POA: Insufficient documentation

## 2013-11-23 DIAGNOSIS — F17210 Nicotine dependence, cigarettes, uncomplicated: Secondary | ICD-10-CM | POA: Insufficient documentation

## 2013-11-23 DIAGNOSIS — Z931 Gastrostomy status: Secondary | ICD-10-CM | POA: Insufficient documentation

## 2013-11-23 DIAGNOSIS — C321 Malignant neoplasm of supraglottis: Secondary | ICD-10-CM | POA: Insufficient documentation

## 2013-11-23 DIAGNOSIS — R635 Abnormal weight gain: Secondary | ICD-10-CM

## 2013-11-23 HISTORY — DX: Personal history of irradiation: Z92.3

## 2013-11-23 MED ORDER — BIAFINE EX EMUL
Freq: Two times a day (BID) | CUTANEOUS | Status: DC
  Administered 2013-11-23: 10:00:00 via TOPICAL

## 2013-11-23 NOTE — Addendum Note (Signed)
Encounter addended by: Jenene Slicker, RN on: 11/23/2013  9:59 AM<BR>     Documentation filed: Orders

## 2013-11-23 NOTE — Addendum Note (Signed)
Encounter addended by: Jenene Slicker, RN on: 11/23/2013 10:17 AM<BR>     Documentation filed: Inpatient MAR

## 2013-11-23 NOTE — Telephone Encounter (Signed)
Called [phone] spoke with nursing home male receptionist"he is on his way there now"

## 2013-11-23 NOTE — Progress Notes (Addendum)
Radiation Oncology         [phone] ________________________________  Name: Andrew Osborn MRN: [id number]  Date: 11/23/2013  DOB: [birth year]  Follow-Up Visit Note  CC: PROVIDER NOT IN SYSTEM  Ruby Cola, MD  Diagnosis and Prior Radiotherapy:       ICD-9-CM ICD-10-CM  1. Malignant neoplasm of supraglottis 161.1 C32.1    Completed 70 Gy with concurrent chemotherapy on 11-09-13  Narrative:  The patient returns today for routine follow-up.   He is currently in no pain. Pt complains of, Fatigue.  Pt denies dysphagia. PO Diet: Regular.  Continues to apply Biafine. Noted a moist cough, pt reports occasionally its productive for small amount of white sputum. Missed appt last week with Med/Onc.                    ALLERGIES:  has No Known Allergies.  Meds: Current Outpatient Prescriptions  Medication Sig Dispense Refill  . acetaminophen (TYLENOL) 325 MG tablet Take 650 mg by mouth every 6 (six) hours as needed (pain).      . Alum & Mag Hydroxide-Simeth (MAGIC MOUTHWASH) SOLN Swish and Spit 5 ml QID PRN,  30 minutes before eating and at bedtime.  500 mL  5  . aspirin EC 81 MG tablet Take 81 mg by mouth daily.      . cholecalciferol (VITAMIN D) 400 UNITS TABS tablet Take 800 Units by mouth daily.      . cycloSPORINE (RESTASIS) 0.05 % ophthalmic emulsion Place 1 drop into both eyes 2 (two) times daily.      Marland Kitchen docusate sodium (COLACE) 100 MG capsule Take 1 capsule (100 mg total) by mouth 2 (two) times daily. Take once a day if stool gets too soft.  60 capsule  2  . emollient (BIAFINE) cream Apply 1 application topically 2 (two) times daily. Apply after rad txs and bedtime daily and on weekends 3rd tube given 11/09/13 eot      . guaifenesin (ROBITUSSIN) 100 MG/5ML syrup Take 200 mg by mouth 3 (three) times daily as needed for cough.      . hydrocerin (EUCERIN) CREA Apply 1 application topically 2 (two) times daily. Spread to bilateral palm twice daily      .  HYDROCODONE-ACETAMINOPHEN PO Take 15 mLs by mouth every 4 (four) hours as needed (Hydorcodone 7.5/325mg /23ml oral solution.Pain).       Marland Kitchen HYDROmorphone (DILAUDID) 2 MG tablet Take 1 tablet (2 mg total) by mouth every 4 (four) hours as needed for moderate pain or severe pain.  30 tablet  0  . lidocaine-prilocaine (EMLA) cream Apply 1 application topically as needed.  30 g  6  . nystatin (MYCOSTATIN) 100000 UNIT/ML suspension Take 5 mLs (500,000 Units total) by mouth 4 (four) times daily.  240 mL  0  . ondansetron (ZOFRAN) 4 MG tablet Take 4 mg by mouth 3 (three) times daily as needed for nausea or vomiting.       Marland Kitchen OVER THE COUNTER MEDICATION Take 237 mLs by mouth 3 (three) times daily. [employer]      . polyethylene glycol (MIRALAX / GLYCOLAX) packet MIX 1 PACKET (17GM) IN SUITABLE LIQUID AND DRINK BY MOUTH EVERY DAY **NO REFILLS**  14 each  10  . promethazine (PHENERGAN) 25 MG tablet Take 1 tablet (25 mg total) by mouth every 6 (six) hours as needed for nausea.  60 tablet  3  . risperiDONE (RISPERDAL) 2 MG tablet Take 2 mg by mouth 2 (two)  times daily.      . simvastatin (ZOCOR) 10 MG tablet Take 10 mg by mouth at bedtime.       No current facility-administered medications for this encounter.    Physical Findings: The patient is in no acute distress. Patient is alert and oriented.  weight is 155 lb 11.2 oz (70.625 kg). His oral temperature is 98 F (36.7 C). His blood pressure is 111/54 and his pulse is 80. His respiration is 24 and oxygen saturation is 98%. .  Oropharyngeal mucosa is intact with no thrush or lesions. No palpable cervical or supraclavicular lymphadenopathy. Skin slightly dry, intact and smooth over neck.   PEG site clean, intact  Lab Findings: Lab Results  Component Value Date   WBC 3.2* 11/06/2013   HGB 10.6* 11/06/2013   HCT 31.9* 11/06/2013   MCV 93.4 11/06/2013   PLT 114* 11/06/2013    No results found for this basename: TSH    Radiographic Findings: No results  found.  Impression/Plan:    1) Head and Neck Cancer Status: healing from ChRT  2) Nutritional Status: - weight: increasing - PEG tube: flushing only  3) Risk Factors: The patient has been educated about risk factors including alcohol and tobacco abuse; they understand that avoidance of alcohol and tobacco is important to prevent recurrences as well as other cancers. Still smoking 7 cigs daily. Not motivated to quit yet.  4) Swallowing: good  5) Dental: Encouraged to continue regular followup with dentistry, and dental hygiene including fluoride rinses.   6) Thyroid function: check TSH in 3.5 mo  7) Social: Lives in group home. Missed med/onc appt. Alert Gayleen Orem, RN, our Head and Neck Oncology Navigator  8) Other: Biafine for skin.  9) Follow-up in 3.5 months with PET, TSH. See med/onc for subacute issues until then. I will follow him thereafter in the long term. The patient was encouraged to call with any issues or questions before then.    _____________________________________   Eppie Gibson, MD

## 2013-11-23 NOTE — Progress Notes (Signed)
He is currently in no pain. Pt complains of, Fatigue.  Pt presenting appropriate quality, quantity and organization of sentences, loud, garbled. Pt denies dysphagia. PO Diet: Regular. Oral exam reveals mucous membranes moist, pharynx normal without lesions. Skin slight erythema - neck. Continues to apply Biafine. Noted a moist cough, pt reports occasionally its productive for small amount of white sputum.

## 2013-11-23 NOTE — Telephone Encounter (Signed)
CALLED PATIENT TO INFORM OF LAB, TEST AND FU SPOKE WITH TISHA NURSE OF [facility] (@ [place]), SPOKE WITH TISHA AND SHE IS AWARE OF THESE APPTS.

## 2013-11-24 ENCOUNTER — Ambulatory Visit: Payer: Medicare Other | Attending: Radiation Oncology

## 2013-11-24 DIAGNOSIS — Z5189 Encounter for other specified aftercare: Secondary | ICD-10-CM | POA: Insufficient documentation

## 2013-11-24 DIAGNOSIS — R1310 Dysphagia, unspecified: Secondary | ICD-10-CM | POA: Diagnosis not present

## 2013-12-03 ENCOUNTER — Other Ambulatory Visit: Payer: Self-pay | Admitting: Hematology and Oncology

## 2013-12-04 ENCOUNTER — Telehealth: Payer: Self-pay | Admitting: *Deleted

## 2013-12-04 NOTE — Telephone Encounter (Signed)
Spoke with [place] at [facility], reminded her of patient's appts on Monday, 10:30 with Dr. Enrique Sack and 12:00 with Dory Peru.  She indicated she will most likely be bringing him.  Gayleen Orem, RN, BSN, [facility] at [place] [phone]

## 2013-12-07 ENCOUNTER — Encounter (INDEPENDENT_AMBULATORY_CARE_PROVIDER_SITE_OTHER): Payer: Self-pay

## 2013-12-07 ENCOUNTER — Encounter (HOSPITAL_COMMUNITY): Payer: Self-pay | Admitting: Dentistry

## 2013-12-07 ENCOUNTER — Ambulatory Visit: Payer: Medicare Other | Admitting: Nutrition

## 2013-12-07 ENCOUNTER — Ambulatory Visit (HOSPITAL_COMMUNITY): Payer: Medicaid - Dental | Admitting: Dentistry

## 2013-12-07 VITALS — BP 109/62 | HR 52 | Temp 98.1°F | Wt 152.0 lb

## 2013-12-07 DIAGNOSIS — M270 Developmental disorders of jaws: Secondary | ICD-10-CM

## 2013-12-07 DIAGNOSIS — Z9221 Personal history of antineoplastic chemotherapy: Secondary | ICD-10-CM

## 2013-12-07 DIAGNOSIS — K117 Disturbances of salivary secretion: Secondary | ICD-10-CM

## 2013-12-07 DIAGNOSIS — R432 Parageusia: Secondary | ICD-10-CM

## 2013-12-07 DIAGNOSIS — R1310 Dysphagia, unspecified: Secondary | ICD-10-CM

## 2013-12-07 DIAGNOSIS — R682 Dry mouth, unspecified: Secondary | ICD-10-CM

## 2013-12-07 DIAGNOSIS — K029 Dental caries, unspecified: Secondary | ICD-10-CM

## 2013-12-07 DIAGNOSIS — C321 Malignant neoplasm of supraglottis: Secondary | ICD-10-CM

## 2013-12-07 DIAGNOSIS — Z923 Personal history of irradiation: Secondary | ICD-10-CM

## 2013-12-07 DIAGNOSIS — K045 Chronic apical periodontitis: Secondary | ICD-10-CM

## 2013-12-07 DIAGNOSIS — K083 Retained dental root: Secondary | ICD-10-CM

## 2013-12-07 DIAGNOSIS — K0530 Chronic periodontitis, unspecified: Secondary | ICD-10-CM

## 2013-12-07 NOTE — Progress Notes (Signed)
12/07/2013  Patient Name:   Andrew Osborn Date of Birth:   [birth year] Medical Record Number: [id number]  BP 109/62 mmHg  Pulse 52  Temp(Src) 98.1 F (36.7 C) (Oral)  Wt 152 lb (68.947 kg)  [name] presents for oral examination after radiation therapy. Patient completed radiation therapy from 09/16/2013 through 11/09/2013. Patient also had radiosensitizing chemotherapy.  REVIEW OF CHIEF COMPLAINTS:  DRY MOUTH: Yes HARD TO SWALLOW: Yes  HURT TO SWALLOW: yes TASTE CHANGES: taste is returning slowly SORES IN MOUTH: none TRISMUS: no discomfort WEIGHT: 152 pounds.  HOME OH REGIMEN:  BRUSHING: twice a day FLOSSING: at bedtime RINSING: using salt water and baking soda rinses and Biotene rinses. FLUORIDE:none TRISMUS EXERCISES:  Maximum interincisal opening: 50 mm   DENTAL EXAM:  Oral Hygiene:(PLAQUE): good oral hygiene. LOCATION OF MUCOSITIS: none noted DESCRIPTION OF SALIVA: decreased saliva, mild xerostomia. ANY EXPOSED BONE: none noted OTHER WATCHED AREAS:  Remaining dentition in poor repair. Plan is for removal of remaining teeth with alveoloplasty and pre-prosthetic surgery as indicated in the operating room with general anesthesia. DX: Xerostomia, Dysgeusia, Dysphagia, Odynophagia and dental caries, retained root segments, apical periodontitis, chronic periodontitis, and mandibular tori  RECOMMENDATIONS: 1. Brush after meals and at bedtime.   2. Use trismus exercises as directed. 3. Use Biotene Rinse or salt water/baking soda rinses. 4. Multiple sips of water as needed. 5. Plan is to remove all remaining teeth with alveoloplasty and pre-prosthetic surgery as indicated in the operating room with general anesthesia. Will need discuss timing of these procedures with Dr. Alvy Bimler and Dr. Isidore Moos.  Will need to have adequate healing to allow of vocal cord area for use of general anesthesia. I will also need to utilize note from Dr. Alvy Bimler or Dr. Isidore Moos for use as  history and physical for the dental operating room procedure. Call if problems before then.  Lenn Cal, DDS

## 2013-12-07 NOTE — Progress Notes (Signed)
Nutrition followup completed with patient. Patient states he feels well.  He does report some increased fatigue. Continues to tolerate nutrition by mouth with weight documented as 152 pounds on November 9, down 2 pounds from 154 pounds. Patient is consuming one to 2 oral nutrition supplements by mouth daily.   He is consuming a soft diet. Patient reports flushing feeding tube every day.  Nutrition diagnosis: Predicted suboptimal energy intake continues.  Intervention: Patient educated to consume soft diet with 2 to 3 oral nutrition supplements between meals. Provided samples of oral nutrition supplements along with coupons. Questions were answered and teach back method used.  Monitoring, evaluation, goals: Patient is tolerating oral intake and continues to flush feeding tube as directed.  Next visit: Will continue to follow patient as needed.  **Disclaimer: This note was dictated with voice recognition software. Similar sounding words can inadvertently be transcribed and this note may contain transcription errors which may not have been corrected upon publication of note.**

## 2013-12-07 NOTE — Patient Instructions (Signed)
RECOMMENDATIONS: 1. Brush after meals and at bedtime.   2. Use trismus exercises as directed. 3. Use Biotene Rinse or salt water/baking soda rinses. 4. Multiple sips of water as needed. 5. Plan is to remove all remaining teeth with alveoloplasty and pre-prosthetic surgery as indicated in the operating room with general anesthesia. Will need discuss timing of these procedures with Dr. Alvy Bimler and Dr. Isidore Moos.  Will need to have adequate healing to allow of vocal cord area for use of general anesthesia. I will also need to utilize note from Dr. Alvy Bimler or Dr. Isidore Moos for use as history and physical for the dental operating room procedure. Call if problems before then.  Lenn Cal, DDS

## 2013-12-08 ENCOUNTER — Telehealth: Payer: Self-pay | Admitting: *Deleted

## 2013-12-08 ENCOUNTER — Other Ambulatory Visit: Payer: Self-pay | Admitting: *Deleted

## 2013-12-08 ENCOUNTER — Telehealth (HOSPITAL_COMMUNITY): Payer: Self-pay

## 2013-12-08 NOTE — Telephone Encounter (Signed)
Spoke with [name] at [facility], confirmed 12:45 lab appt and 1:15 appt with Dr. Alvy Bimler on 12/15/13.  I will call with reminder on 12/14/13.  Gayleen Orem, RN, BSN, [place] at [place] [phone]

## 2013-12-08 NOTE — Telephone Encounter (Signed)
12/08/13       Called Ms. Glennon Mac Adminstrative Director at [facility] regarding pt. Andrew Osborn.  Dr. Enrique Sack will follow up w/pt. regarding extractions after PET in Feburary w/ Dr. Isidore Moos .  LRI

## 2013-12-09 ENCOUNTER — Telehealth: Payer: Self-pay | Admitting: Hematology and Oncology

## 2013-12-09 NOTE — Telephone Encounter (Signed)
s.w living facility and advised on NOV appts...ok and aware

## 2013-12-15 ENCOUNTER — Ambulatory Visit (HOSPITAL_BASED_OUTPATIENT_CLINIC_OR_DEPARTMENT_OTHER): Payer: Medicare Other

## 2013-12-15 ENCOUNTER — Other Ambulatory Visit (HOSPITAL_BASED_OUTPATIENT_CLINIC_OR_DEPARTMENT_OTHER): Payer: Medicare Other

## 2013-12-15 ENCOUNTER — Telehealth: Payer: Self-pay | Admitting: Hematology and Oncology

## 2013-12-15 ENCOUNTER — Encounter: Payer: Self-pay | Admitting: Hematology and Oncology

## 2013-12-15 ENCOUNTER — Telehealth: Payer: Self-pay | Admitting: *Deleted

## 2013-12-15 ENCOUNTER — Ambulatory Visit (HOSPITAL_BASED_OUTPATIENT_CLINIC_OR_DEPARTMENT_OTHER): Payer: Medicare Other | Admitting: Hematology and Oncology

## 2013-12-15 VITALS — BP 133/45 | HR 42 | Temp 97.4°F | Resp 17 | Ht 70.0 in | Wt 157.1 lb

## 2013-12-15 DIAGNOSIS — L589 Radiodermatitis, unspecified: Secondary | ICD-10-CM

## 2013-12-15 DIAGNOSIS — C321 Malignant neoplasm of supraglottis: Secondary | ICD-10-CM

## 2013-12-15 DIAGNOSIS — D696 Thrombocytopenia, unspecified: Secondary | ICD-10-CM

## 2013-12-15 DIAGNOSIS — D630 Anemia in neoplastic disease: Secondary | ICD-10-CM

## 2013-12-15 DIAGNOSIS — E46 Unspecified protein-calorie malnutrition: Secondary | ICD-10-CM

## 2013-12-15 DIAGNOSIS — Z720 Tobacco use: Secondary | ICD-10-CM

## 2013-12-15 DIAGNOSIS — E871 Hypo-osmolality and hyponatremia: Secondary | ICD-10-CM

## 2013-12-15 DIAGNOSIS — Z95828 Presence of other vascular implants and grafts: Secondary | ICD-10-CM

## 2013-12-15 DIAGNOSIS — Z931 Gastrostomy status: Secondary | ICD-10-CM

## 2013-12-15 LAB — CBC WITH DIFFERENTIAL/PLATELET
BASO%: 0.2 % (ref 0.0–2.0)
Basophils Absolute: 0 10*3/uL (ref 0.0–0.1)
EOS%: 9.4 % — AB (ref 0.0–7.0)
Eosinophils Absolute: 0.5 10*3/uL (ref 0.0–0.5)
HCT: 35.1 % — ABNORMAL LOW (ref 38.4–49.9)
HGB: 11.9 g/dL — ABNORMAL LOW (ref 13.0–17.1)
LYMPH%: 16 % (ref 14.0–49.0)
MCH: 32.6 pg (ref 27.2–33.4)
MCHC: 33.9 g/dL (ref 32.0–36.0)
MCV: 96.2 fL (ref 79.3–98.0)
MONO#: 0.6 10*3/uL (ref 0.1–0.9)
MONO%: 12.7 % (ref 0.0–14.0)
NEUT#: 3 10*3/uL (ref 1.5–6.5)
NEUT%: 61.7 % (ref 39.0–75.0)
PLATELETS: 107 10*3/uL — AB (ref 140–400)
RBC: 3.65 10*6/uL — AB (ref 4.20–5.82)
RDW: 15.6 % — AB (ref 11.0–14.6)
WBC: 4.8 10*3/uL (ref 4.0–10.3)
lymph#: 0.8 10*3/uL — ABNORMAL LOW (ref 0.9–3.3)

## 2013-12-15 LAB — COMPREHENSIVE METABOLIC PANEL (CC13)
ALK PHOS: 66 U/L (ref 40–150)
ALT: <6 U/L (ref 0–55)
AST: 9 U/L (ref 5–34)
Albumin: 3.6 g/dL (ref 3.5–5.0)
Anion Gap: 7 mEq/L (ref 3–11)
BUN: 13.3 mg/dL (ref 7.0–26.0)
CO2: 27 mEq/L (ref 22–29)
Calcium: 9.4 mg/dL (ref 8.4–10.4)
Chloride: 95 mEq/L — ABNORMAL LOW (ref 98–109)
Creatinine: 0.8 mg/dL (ref 0.7–1.3)
GLUCOSE: 103 mg/dL (ref 70–140)
Potassium: 4.3 mEq/L (ref 3.5–5.1)
SODIUM: 128 meq/L — AB (ref 136–145)
TOTAL PROTEIN: 6.2 g/dL — AB (ref 6.4–8.3)
Total Bilirubin: 0.26 mg/dL (ref 0.20–1.20)

## 2013-12-15 LAB — MAGNESIUM (CC13): Magnesium: 2.3 mg/dl (ref 1.5–2.5)

## 2013-12-15 MED ORDER — HEPARIN SOD (PORK) LOCK FLUSH 100 UNIT/ML IV SOLN
500.0000 [IU] | Freq: Once | INTRAVENOUS | Status: AC
  Administered 2013-12-15: 500 [IU] via INTRAVENOUS
  Filled 2013-12-15: qty 5

## 2013-12-15 MED ORDER — SODIUM CHLORIDE 0.9 % IJ SOLN
10.0000 mL | INTRAMUSCULAR | Status: DC | PRN
  Administered 2013-12-15: 10 mL via INTRAVENOUS
  Filled 2013-12-15: qty 10

## 2013-12-15 NOTE — Telephone Encounter (Signed)
Confirmed with [facility] that patient will be here for today's 12:45 Labs and 1:15 appt with Dr. Alvy Bimler.  Gayleen Orem, RN, BSN, [place] at [place] [phone] -

## 2013-12-15 NOTE — Assessment & Plan Note (Signed)
This has resolved.

## 2013-12-15 NOTE — Assessment & Plan Note (Signed)
This is likely due to recent treatment. The patient denies recent history of bleeding such as epistaxis, hematuria or hematochezia. He is asymptomatic from the anemia. I will observe for now.  He does not require transfusion now. 

## 2013-12-15 NOTE — Assessment & Plan Note (Signed)
This is improving and he has not needed to use feeding tube. I continue to encourage oral intake.

## 2013-12-15 NOTE — Progress Notes (Signed)
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   08/13/2013 Procedure Laryngoscopy showed the right false vocal fold and the right and midline laryngeal surface of the epiglottis showed a fungating, irregular mass   08/26/2013 Imaging PET scan showed right supraglottic mass with metastatic bilateral level II and III lymph nodes. Resultant severe narrowing of the airway at or just above the vocal folds.    09/15/2013 Procedure He has placement of feeding tube and port.   09/16/2013 - 10/28/2013 Chemotherapy He started on high dose cisplatin chemotherapy with radiation. Last dose of cisplatin was reduced by 20% due to mild leukopenia.   09/16/2013 -  Radiation Therapy He started on daily radiation therapy    INTERVAL HISTORY: Please see below for problem oriented charting. He returns today for further follow-up. He is doing better. He has not used a lot of his medication. Pain has resolved.  REVIEW OF SYSTEMS:   Constitutional: Denies fevers, chills or abnormal weight loss Eyes: Denies blurriness of vision Ears, nose, mouth, throat, and face: Denies mucositis or sore throat Respiratory: Denies cough, dyspnea or wheezes Cardiovascular: Denies palpitation, chest  discomfort or lower extremity swelling Gastrointestinal:  Denies nausea, heartburn or change in bowel habits Skin: Denies abnormal skin rashes Lymphatics: Denies new lymphadenopathy or easy bruising Neurological:Denies numbness, tingling or new weaknesses Behavioral/Psych: Mood is stable, no new changes  All other systems were reviewed with the patient and are negative.  I have reviewed the past medical history, past surgical history, social history and family history with the patient and they are unchanged from previous note.  ALLERGIES:  has No Known Allergies.  MEDICATIONS:  Current Outpatient Prescriptions  Medication Sig Dispense Refill  . acetaminophen (TYLENOL) 325 MG tablet Take 650 mg by mouth every 6 (six) hours as needed (pain).    Marland Kitchen aspirin EC 81 MG tablet Take 81 mg by mouth daily.    . cholecalciferol (VITAMIN D) 400 UNITS TABS tablet Take 800 Units by mouth daily.    . cycloSPORINE (RESTASIS) 0.05 % ophthalmic emulsion Place 1 drop into both eyes 2 (two) times daily.    Marland Kitchen docusate sodium (COLACE) 100 MG capsule Take 1 capsule (100 mg total) by mouth 2 (two) times daily. Take once a day if stool gets too soft. 60 capsule 2  . emollient (BIAFINE) cream Apply 1 application topically 2 (two) times daily. Apply after rad txs and bedtime daily and on weekends 3rd tube given 11/09/13 eot    . HYDROCODONE-ACETAMINOPHEN PO Take 15 mLs by mouth every 4 (four) hours as needed (Hydorcodone 7.5/325mg /32ml oral solution.Pain).     Marland Kitchen HYDROmorphone (DILAUDID) 2 MG tablet Take 1 tablet (2 mg total) by mouth every 4 (four) hours as needed for moderate  pain or severe pain. 30 tablet 0  . lidocaine-prilocaine (EMLA) cream Apply 1 application topically as needed. 30 g 6  . risperiDONE (RISPERDAL) 2 MG tablet Take 2 mg by mouth 2 (two) times daily.    . simvastatin (ZOCOR) 10 MG tablet Take 10 mg by mouth at bedtime.     No current facility-administered medications for this visit.    Facility-Administered Medications Ordered in Other Visits  Medication Dose Route Frequency Provider Last Rate Last Dose  . sodium chloride 0.9 % injection 10 mL  10 mL Intravenous PRN Heath Lark, MD   10 mL at 12/15/13 1340    PHYSICAL EXAMINATION: ECOG PERFORMANCE STATUS: 0 - Asymptomatic  Filed Vitals:   12/15/13 1259  BP: 133/45  Pulse: 42  Temp: 97.4 F (36.3 C)  Resp: 17   Filed Weights   12/15/13 1259  Weight: 157 lb 1.6 oz (71.26 kg)    GENERAL:alert, no distress and comfortable SKIN: skin color, texture, turgor are normal, no rashes or significant lesions. Mild radiation-induced dermatitis, improve compared to prior exam. EYES: normal, Conjunctiva are pink and non-injected, sclera clear OROPHARYNX:no exudate, no erythema and lips, buccal mucosa, and tongue normal. Poor dentition is noted.  NECK: supple, thyroid normal size, non-tender, without nodularity LYMPH:  no palpable lymphadenopathy in the cervical, axillary or inguinal LUNGS: clear to auscultation and percussion with normal breathing effort HEART: regular rate & rhythm and no murmurs and no lower extremity edema ABDOMEN:abdomen soft, non-tender and normal bowel sounds. Feeding tube site looks okay Musculoskeletal:no cyanosis of digits and no clubbing  NEURO: alert & oriented x 3 with fluent speech, no focal motor/sensory deficits  LABORATORY DATA:  I have reviewed the data as listed    Component Value Date/Time   NA 128* 12/15/2013 1234   NA 137 09/15/2013 0940   K 4.3 12/15/2013 1234   K 3.9 09/15/2013 0940   CL 99 09/15/2013 0940   CO2 27 12/15/2013 1234   CO2 28 09/15/2013 0940   GLUCOSE 103 12/15/2013 1234   GLUCOSE 112* 09/15/2013 0940   BUN 13.3 12/15/2013 1234   BUN 9 09/15/2013 0940   CREATININE 0.8 12/15/2013 1234   CREATININE 0.62 09/15/2013 0940   CALCIUM 9.4 12/15/2013 1234   CALCIUM 10.0 09/15/2013 0940   PROT 6.2* 12/15/2013 1234   PROT 6.5 09/07/2013 1430   ALBUMIN 3.6  12/15/2013 1234   ALBUMIN 3.3* 09/07/2013 1430   AST 9 12/15/2013 1234   AST 11 09/07/2013 1430   ALT <6 12/15/2013 1234   ALT 7 09/07/2013 1430   ALKPHOS 66 12/15/2013 1234   ALKPHOS 92 09/07/2013 1430   BILITOT 0.26 12/15/2013 1234   BILITOT <0.2* 09/07/2013 1430   GFRNONAA >90 09/15/2013 0940   GFRAA >90 09/15/2013 0940    No results found for: SPEP, UPEP  Lab Results  Component Value Date   WBC 4.8 12/15/2013   NEUTROABS 3.0 12/15/2013   HGB 11.9* 12/15/2013   HCT 35.1* 12/15/2013   MCV 96.2 12/15/2013   PLT 107* 12/15/2013      Chemistry      Component Value Date/Time   NA 128* 12/15/2013 1234   NA 137 09/15/2013 0940   K 4.3 12/15/2013 1234   K 3.9 09/15/2013 0940   CL 99 09/15/2013 0940   CO2 27 12/15/2013 1234   CO2 28 09/15/2013 0940   BUN 13.3 12/15/2013 1234   BUN 9 09/15/2013 0940   CREATININE 0.8 12/15/2013 1234   CREATININE 0.62  09/15/2013 0940      Component Value Date/Time   CALCIUM 9.4 12/15/2013 1234   CALCIUM 10.0 09/15/2013 0940   ALKPHOS 66 12/15/2013 1234   ALKPHOS 92 09/07/2013 1430   AST 9 12/15/2013 1234   AST 11 09/07/2013 1430   ALT <6 12/15/2013 1234   ALT 7 09/07/2013 1430   BILITOT 0.26 12/15/2013 1234   BILITOT <0.2* 09/07/2013 1430      ASSESSMENT & PLAN:  Malignant neoplasm of supraglottis He has completed all treatment and is improving. I recommend removal of feeding tube. I will see him back in February to review results of PET CT scan. If it is negative, we will get the port removed. In the meantime, he will continue port flushes to maintain port patency.  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 The patient denies recent history of bleeding such as epistaxis, hematuria or hematochezia. He is asymptomatic from the anemia. I will observe for now.  He does not require transfusion now.  Protein calorie malnutrition This is improving and he has not needed to use feeding tube. I continue to  encourage oral intake.  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  S/P gastrostomy His feeding tube site looks okay without signs of infection.I recommend removal of feeding tube as he has not used it.    Tobacco abuse I spent some time counseling the patient the importance of tobacco cessation. He is currently attempting to quit now.   Hyponatremia Likely related to mild dehydration and his anti-psychiatric medication. He is not symptomatic. Observe only.    Thrombocytopenia The cause is unknown. It is mild and there is little change compared from previous platelet count. The patient denies recent history of bleeding such as epistaxis, hematuria or hematochezia. He is asymptomatic from the thrombocytopenia. I will observe for now.       Orders Placed This Encounter  Procedures  . IR GASTROSTOMY TUBE REMOVAL/REPAIR    Standing Status: Future     Number of Occurrences:      Standing Expiration Date: 02/15/2015    Order Specific Question:  Reason for Exam (SYMPTOM  OR DIAGNOSIS REQUIRED)    Answer:  no longer need PEG tube    Order Specific Question:  Preferred Imaging Location?    Answer:  [facility]   All questions were answered. The patient knows to call the clinic with any problems, questions or concerns. No barriers to learning was detected. I spent 30 minutes counseling the patient face to face. The total time spent in the appointment was 40 minutes and more than 50% was on counseling and review of test results     [facility], [place], MD 12/15/2013 4:12 PM

## 2013-12-15 NOTE — Assessment & Plan Note (Signed)
His feeding tube site looks okay without signs of infection.I recommend removal of feeding tube as he has not used it.

## 2013-12-15 NOTE — Assessment & Plan Note (Signed)
I spent some time counseling the patient the importance of tobacco cessation. He is currently attempting to quit now.

## 2013-12-15 NOTE — Telephone Encounter (Signed)
Pt confirmed labs/ov/flush per 11/17 POF, gave pt AVS.... KJ, called IR to schedule pt to have tube removed...Marland KitchenMarland Kitchen

## 2013-12-15 NOTE — Assessment & Plan Note (Signed)
He has completed all treatment and is improving. I recommend removal of feeding tube. I will see him back in February to review results of PET CT scan. If it is negative, we will get the port removed. In the meantime, he will continue port flushes to maintain port patency.

## 2013-12-15 NOTE — Telephone Encounter (Signed)
Pt confirmed labs/ov/inj per 11/17 POF, gave pt AVS.... KJ

## 2013-12-15 NOTE — Patient Instructions (Signed)

## 2013-12-15 NOTE — Assessment & Plan Note (Signed)
Likely related to mild dehydration and his anti-psychiatric medication. He is not symptomatic. Observe only.

## 2013-12-15 NOTE — Assessment & Plan Note (Signed)
The cause is unknown. It is mild and there is little change compared from previous platelet count. The patient denies recent history of bleeding such as epistaxis, hematuria or hematochezia. He is asymptomatic from the thrombocytopenia. I will observe for now.     

## 2013-12-22 ENCOUNTER — Ambulatory Visit (HOSPITAL_COMMUNITY)
Admission: RE | Admit: 2013-12-22 | Discharge: 2013-12-22 | Disposition: A | Discharge status: Home / Self Care | Payer: Medicare Other | Source: Ambulatory Visit | Attending: Hematology and Oncology | Admitting: Hematology and Oncology

## 2013-12-22 ENCOUNTER — Ambulatory Visit: Payer: Medicare Other

## 2013-12-22 ENCOUNTER — Ambulatory Visit (HOSPITAL_COMMUNITY): Admission: RE | Admit: 2013-12-22 | Payer: Medicare Other | Source: Ambulatory Visit

## 2013-12-22 DIAGNOSIS — Z4659 Encounter for fitting and adjustment of other gastrointestinal appliance and device: Secondary | ICD-10-CM | POA: Diagnosis present

## 2013-12-22 DIAGNOSIS — C321 Malignant neoplasm of supraglottis: Secondary | ICD-10-CM

## 2013-12-22 MED ORDER — LIDOCAINE VISCOUS 2 % MT SOLN
OROMUCOSAL | Status: AC
  Filled 2013-12-22: qty 15

## 2013-12-22 MED ORDER — LIDOCAINE VISCOUS 2 % MT SOLN
15.0000 mL | Freq: Once | OROMUCOSAL | Status: AC
  Administered 2013-12-22: 15 mL via OROMUCOSAL

## 2013-12-30 ENCOUNTER — Telehealth: Payer: Self-pay | Admitting: *Deleted

## 2013-12-30 NOTE — Telephone Encounter (Signed)
Confirmed with [place] patient's appt with Garald Balding tomorrow at [place].  They verbalized understanding.  Gayleen Orem, RN, BSN, [place] at [place] [phone]

## 2013-12-31 ENCOUNTER — Ambulatory Visit: Payer: Medicare Other | Attending: Radiation Oncology

## 2013-12-31 DIAGNOSIS — R1313 Dysphagia, pharyngeal phase: Secondary | ICD-10-CM

## 2013-12-31 DIAGNOSIS — Z5189 Encounter for other specified aftercare: Secondary | ICD-10-CM | POA: Diagnosis present

## 2013-12-31 DIAGNOSIS — R1310 Dysphagia, unspecified: Secondary | ICD-10-CM | POA: Insufficient documentation

## 2013-12-31 NOTE — Therapy (Signed)
[facility] [address] [place], [place], [address] Phone: [phone]   Fax:  [phone]  Speech Language Pathology Treatment  Patient Details  Name: Andrew Osborn MRN: [id number] Date of Birth: December 28, 1955  Encounter Date: 12/31/2013      End of Session - 12/31/13 1227    Visit Number 4   Number of Visits 4   Date for SLP Re-Evaluation 01/22/14   SLP Start Time 1104   SLP Stop Time  1145   SLP Time Calculation (min) 41 min   Activity Tolerance Patient tolerated treatment well      Past Medical History  Diagnosis Date  . Obesity   . Swallowing difficulty   . Schizophrenia   . 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 Hypertension   . Hyperlipidemia   . COPD (chronic obstructive pulmonary disease)   . Tobacco abuse 08/25/2013  . Throat pain 09/17/2013  . Constipation 09/17/2013  . Thrush 10/28/2013  . S/P radiation therapy Completed 11/09/13     7000  cGy- Supraglottic larynx/bilateral neck    Past Surgical History  Procedure Laterality Date  . Direct laryngoscopy N/A 08/13/2013    Procedure: DIRECT LARYNGOSCOPY;  Surgeon: Ruby Cola, MD;  Location: [facility];  Service: ENT;  Laterality: N/A;  Direct Laryngoscopy with biopsy.  . Inguinal hernia repair      as 58 year old    There were no vitals taken for this visit.  Visit Diagnosis: No diagnosis found.      Subjective Assessment - 12/31/13 1116    Symptoms PEG tube removed since last visit.   Currently in Pain? No/denies            ADULT SLP TREATMENT - 12/31/13 1146    General Information   Behavior/Cognition Alert;Cooperative;Pleasant mood;Impulsive;Requires cueing   Treatment Provided   Treatment provided Dysphagia   Dysphagia Treatment   Oral Cavity - Dentition Poor condition;Missing dentition   Treatment Methods Skilled observation;Therapeutic exercise;Patient/caregiver education   Patient observed directly with PO's Yes   Type  of PO's observed Thin liquids   Pharyngeal Phase Signs & Symptoms Delayed throat clear   Cognitive-Linquistic Treatment   Skilled Treatment Pt observed with granola bar and H2O. Delayed throat clear x2, clear voice afterwards. Pt without overt s/s aspiration PNA. Small bites/sips spontaneously taken. Timely swallow with adequate thyroid elevation. He req'd total A to tell why he was completing exercises. He told SLP some food he ate and SLP encouraged "soft and moist" foods only, due to assessment with granola bar today. Pt completed HEP with occasional min-mod A initially but was independent by session end.    Assessment / Recommendations / Plan   Plan Continue with current plan of care  recertification needed, x1 approx every 4 weeks x2 visits   Dysphagia Recommendations   Diet recommendations Dysphagia 3 (mechanical soft);Thin liquid   Progression Toward Goals   Progression toward goals Progressing toward goals          SLP Education - 12/31/13 1226    Education provided Yes   Education Details Dys III det ("Soft and moist food"), HEP duration   Person(s) Educated Patient   Methods Explanation   Comprehension Verbalized understanding          SLP Short Term Goals - 12/31/13 1230    SLP SHORT TERM GOAL #1   Title pt will tell SLP why he is completing HEP with mod A   Time 2  Period --  visits   Status On-going   SLP SHORT TERM GOAL #2   Title pt will complete HEP with rare min A   Time 2   Period --  visits   Status On-going   SLP SHORT TERM GOAL #3   Title pt will tell SLP two practical foods to eat while healing from head/neck rad tx   Time 2   Period --  visits   Status On-going          SLP Long Term Goals - 12/31/13 1232    SLP LONG TERM GOAL #1   Title pt will complete HEP with modified independence   Time 3   Period --  visits   Status On-going   SLP LONG TERM GOAL #2   Title pt will tell SLP why he is completing HEP with occasional min A   Time 3    Period --  visits   Status On-going          Plan - 12/31/13 1228    Clinical Impression Statement pt cont to require skilled ST to maintain independence with HEP as well as to assess current PO diet.   Speech Therapy Frequency --  x1 approx every 4 weeks    Duration --  x2 visits   Treatment/Interventions Pharyngeal strengthening exercises;Diet toleration management by SLP;Trials of upgraded texture/liquids;Patient/family education;SLP instruction and feedback;Compensatory strategies   Potential to Achieve Goals Good   Potential Considerations Family/community support;Previous level of function   SLP Home Exercise Plan pt reported he completes HEP x2 daily. SLP reiterated completion at that frequency until 05-14-14, then x3/week after that date.   Consulted and Agree with Plan of Care Patient            Problem List Patient Active Problem List   Diagnosis Date Noted  . Thrombocytopenia 12/15/2013  . Thrush 10/28/2013  . 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 Hyponatremia 10/07/2013  . Protein calorie malnutrition 09/23/2013  . Throat pain 09/17/2013  . Constipation 09/17/2013  . S/P gastrostomy 09/17/2013  . Hypertension 09/07/2013  . Schizophrenia 09/07/2013  . Narrowing of airway 09/07/2013  . Tobacco abuse 08/25/2013  . Decay, teeth 08/25/2013  . 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 Malignant neoplasm of supraglottis 08/21/2013    Garald Balding, MS, [place]  12/31/2013 12:38 PM  Phone: [phone] Fax: [phone]

## 2013-12-31 NOTE — Patient Instructions (Signed)
Continue to complete swallowing exercises until April 15th, then 3x a week after that.  If your voice does not improve over the next 4-6 weeks, contact Dr. Simeon Craft or Dr. Constance Holster at [facility] at [phone].

## 2014-01-26 ENCOUNTER — Other Ambulatory Visit: Payer: Self-pay

## 2014-01-26 ENCOUNTER — Other Ambulatory Visit: Payer: Self-pay | Admitting: Hematology and Oncology

## 2014-01-26 ENCOUNTER — Ambulatory Visit (HOSPITAL_BASED_OUTPATIENT_CLINIC_OR_DEPARTMENT_OTHER): Payer: Medicare Other

## 2014-01-26 VITALS — BP 137/80 | HR 78 | Temp 98.7°F | Resp 14

## 2014-01-26 DIAGNOSIS — C321 Malignant neoplasm of supraglottis: Secondary | ICD-10-CM

## 2014-01-26 DIAGNOSIS — Z452 Encounter for adjustment and management of vascular access device: Secondary | ICD-10-CM

## 2014-01-26 MED ORDER — MAGIC MOUTHWASH W/LIDOCAINE: 5.0000 mL | Freq: Four times a day (QID) | ORAL | Status: DC

## 2014-01-26 MED ORDER — LIDOCAINE-PRILOCAINE 2.5-2.5 % EX CREA: 1.0000 "application " | TOPICAL_CREAM | CUTANEOUS | Status: DC | PRN

## 2014-01-26 MED ORDER — SODIUM CHLORIDE 0.9 % IJ SOLN
10.0000 mL | INTRAMUSCULAR | Status: DC | PRN
  Administered 2014-01-26: 10 mL via INTRAVENOUS
  Filled 2014-01-26: qty 10

## 2014-01-26 MED ORDER — HEPARIN SOD (PORK) LOCK FLUSH 100 UNIT/ML IV SOLN
500.0000 [IU] | Freq: Once | INTRAVENOUS | Status: AC
  Administered 2014-01-26: 500 [IU] via INTRAVENOUS
  Filled 2014-01-26: qty 5

## 2014-02-09 ENCOUNTER — Ambulatory Visit: Payer: Medicare Other | Attending: Radiation Oncology

## 2014-02-09 DIAGNOSIS — R1310 Dysphagia, unspecified: Secondary | ICD-10-CM | POA: Insufficient documentation

## 2014-02-09 DIAGNOSIS — Z5189 Encounter for other specified aftercare: Secondary | ICD-10-CM | POA: Insufficient documentation

## 2014-03-02 ENCOUNTER — Ambulatory Visit (HOSPITAL_COMMUNITY): Payer: Medicare Other

## 2014-03-04 ENCOUNTER — Ambulatory Visit
Admission: RE | Admit: 2014-03-04 | Discharge: 2014-03-04 | Disposition: A | Discharge status: Home / Self Care | Payer: Medicare Other | Source: Ambulatory Visit | Attending: Radiation Oncology | Admitting: Radiation Oncology

## 2014-03-04 ENCOUNTER — Ambulatory Visit: Payer: Medicare Other

## 2014-03-04 ENCOUNTER — Ambulatory Visit (HOSPITAL_COMMUNITY)
Admission: RE | Admit: 2014-03-04 | Discharge: 2014-03-04 | Disposition: A | Discharge status: Home / Self Care | Payer: Medicare Other | Source: Ambulatory Visit | Attending: Radiation Oncology | Admitting: Radiation Oncology

## 2014-03-04 ENCOUNTER — Encounter (HOSPITAL_COMMUNITY): Payer: Self-pay

## 2014-03-04 ENCOUNTER — Other Ambulatory Visit (HOSPITAL_BASED_OUTPATIENT_CLINIC_OR_DEPARTMENT_OTHER): Payer: Medicare Other

## 2014-03-04 ENCOUNTER — Other Ambulatory Visit: Payer: Self-pay

## 2014-03-04 VITALS — BP 142/91 | HR 82 | Temp 98.5°F

## 2014-03-04 DIAGNOSIS — C321 Malignant neoplasm of supraglottis: Secondary | ICD-10-CM | POA: Insufficient documentation

## 2014-03-04 DIAGNOSIS — R5383 Other fatigue: Secondary | ICD-10-CM | POA: Insufficient documentation

## 2014-03-04 DIAGNOSIS — R635 Abnormal weight gain: Secondary | ICD-10-CM

## 2014-03-04 DIAGNOSIS — R5381 Other malaise: Secondary | ICD-10-CM

## 2014-03-04 DIAGNOSIS — Z95828 Presence of other vascular implants and grafts: Secondary | ICD-10-CM

## 2014-03-04 DIAGNOSIS — R634 Abnormal weight loss: Secondary | ICD-10-CM | POA: Insufficient documentation

## 2014-03-04 LAB — CBC WITH DIFFERENTIAL/PLATELET
BASO%: 0.4 % (ref 0.0–2.0)
Basophils Absolute: 0 10*3/uL (ref 0.0–0.1)
EOS ABS: 0.2 10*3/uL (ref 0.0–0.5)
EOS%: 3 % (ref 0.0–7.0)
HEMATOCRIT: 39.1 % (ref 38.4–49.9)
HEMOGLOBIN: 13.7 g/dL (ref 13.0–17.1)
LYMPH%: 12.7 % — ABNORMAL LOW (ref 14.0–49.0)
MCH: 32.6 pg (ref 27.2–33.4)
MCHC: 35 g/dL (ref 32.0–36.0)
MCV: 93.1 fL (ref 79.3–98.0)
MONO#: 0.7 10*3/uL (ref 0.1–0.9)
MONO%: 12.3 % (ref 0.0–14.0)
NEUT%: 71.6 % (ref 39.0–75.0)
NEUTROS ABS: 3.8 10*3/uL (ref 1.5–6.5)
PLATELETS: 118 10*3/uL — AB (ref 140–400)
RBC: 4.2 10*6/uL (ref 4.20–5.82)
RDW: 14.1 % (ref 11.0–14.6)
WBC: 5.3 10*3/uL (ref 4.0–10.3)
lymph#: 0.7 10*3/uL — ABNORMAL LOW (ref 0.9–3.3)

## 2014-03-04 LAB — COMPREHENSIVE METABOLIC PANEL (CC13)
ALT: 10 U/L (ref 0–55)
ANION GAP: 8 meq/L (ref 3–11)
AST: 11 U/L (ref 5–34)
Albumin: 3.8 g/dL (ref 3.5–5.0)
Alkaline Phosphatase: 75 U/L (ref 40–150)
BILIRUBIN TOTAL: 0.32 mg/dL (ref 0.20–1.20)
BUN: 6.9 mg/dL — ABNORMAL LOW (ref 7.0–26.0)
CO2: 29 meq/L (ref 22–29)
Calcium: 9.3 mg/dL (ref 8.4–10.4)
Chloride: 97 mEq/L — ABNORMAL LOW (ref 98–109)
Creatinine: 0.8 mg/dL (ref 0.7–1.3)
GLUCOSE: 106 mg/dL (ref 70–140)
Potassium: 4.1 mEq/L (ref 3.5–5.1)
Sodium: 134 mEq/L — ABNORMAL LOW (ref 136–145)
TOTAL PROTEIN: 6.5 g/dL (ref 6.4–8.3)

## 2014-03-04 LAB — TSH CHCC: TSH: 2.046 m(IU)/L (ref 0.320–4.118)

## 2014-03-04 LAB — GLUCOSE, CAPILLARY: Glucose-Capillary: 113 mg/dL — ABNORMAL HIGH (ref 70–99)

## 2014-03-04 MED ORDER — SODIUM CHLORIDE 0.9 % IJ SOLN
10.0000 mL | INTRAMUSCULAR | Status: DC | PRN
  Administered 2014-03-04: 10 mL via INTRAVENOUS
  Filled 2014-03-04: qty 10

## 2014-03-04 MED ORDER — FLUDEOXYGLUCOSE F - 18 (FDG) INJECTION
8.3000 | Freq: Once | INTRAVENOUS | Status: AC | PRN
  Administered 2014-03-04: 8.3 via INTRAVENOUS

## 2014-03-04 MED ORDER — HEPARIN SOD (PORK) LOCK FLUSH 100 UNIT/ML IV SOLN
500.0000 [IU] | Freq: Once | INTRAVENOUS | Status: AC
  Administered 2014-03-04: 500 [IU] via INTRAVENOUS
  Filled 2014-03-04: qty 5

## 2014-03-04 NOTE — Patient Instructions (Signed)

## 2014-03-05 ENCOUNTER — Encounter: Payer: Self-pay | Admitting: Radiation Oncology

## 2014-03-05 ENCOUNTER — Telehealth: Payer: Self-pay | Admitting: Hematology and Oncology

## 2014-03-05 ENCOUNTER — Ambulatory Visit (HOSPITAL_BASED_OUTPATIENT_CLINIC_OR_DEPARTMENT_OTHER): Payer: Medicare Other | Admitting: Hematology and Oncology

## 2014-03-05 VITALS — BP 109/89 | HR 84 | Resp 22

## 2014-03-05 VITALS — BP 109/89 | HR 84 | Temp 98.6°F | Resp 23 | Ht 70.0 in | Wt 150.3 lb

## 2014-03-05 DIAGNOSIS — C321 Malignant neoplasm of supraglottis: Secondary | ICD-10-CM

## 2014-03-05 DIAGNOSIS — Z720 Tobacco use: Secondary | ICD-10-CM

## 2014-03-05 MED ORDER — LARYNGOSCOPY SOLUTION RAD-ONC
15.0000 mL | Freq: Once | TOPICAL | Status: AC
  Administered 2014-03-05: 15 mL via TOPICAL
  Filled 2014-03-05: qty 15

## 2014-03-05 NOTE — Assessment & Plan Note (Signed)
I spent some time counseling the patient the importance of tobacco cessation. He is currently not interested to quit now.  

## 2014-03-05 NOTE — Assessment & Plan Note (Signed)
He has complete response to treatment. I recommend continue follow-up with radiation oncologist and ENT. I will get his case for next radiology review at the ENT tumor board. I will arrange for his port to be removed. I will discharge him from my office.

## 2014-03-05 NOTE — Progress Notes (Signed)
Patient denies pain, difficulty eating/swallowing, loss of appetite, fatigue. He continues to smoke 10-15 cigarettes daily.  Patient seen by Dr Alvy Bimler this morning. He continues to live in a group home.

## 2014-03-05 NOTE — Telephone Encounter (Signed)
Pt confirmed port removal per 02/05 POF, gave pt AVS.... KJ, called scheduled port removal with Tiffany.Marland KitchenMarland KitchenMarland Kitchen

## 2014-03-05 NOTE — Progress Notes (Signed)
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   08/13/2013 Procedure Laryngoscopy showed the right false vocal fold and the right and midline laryngeal surface of the epiglottis showed a fungating, irregular mass   08/26/2013 Imaging PET scan showed right supraglottic mass with metastatic bilateral level II and III lymph nodes. Resultant severe narrowing of the airway at or just above the vocal folds.    09/15/2013 Procedure He has placement of feeding tube and port.   09/16/2013 - 10/28/2013 Chemotherapy He started on high dose cisplatin chemotherapy with radiation. Last dose of cisplatin was reduced by 20% due to mild leukopenia.   09/16/2013 - 11/09/2013 Radiation Therapy He received daily radiation therapy   12/22/2013 Procedure Feeding tube was removed.   03/04/2014 Imaging PET CT scan showed complete response to treatment.    INTERVAL HISTORY: Please see below for problem oriented charting. He returns today for further follow-up. He has mild hoarseness. He denies new lymphadenopathy. All his prior mucositis pain had resolved. He denies swallowing difficulties.  REVIEW OF SYSTEMS:   Constitutional: Denies fevers, chills or abnormal weight loss Eyes: Denies  blurriness of vision Ears, nose, mouth, throat, and face: Denies mucositis or sore throat Respiratory: Denies cough, dyspnea or wheezes Cardiovascular: Denies palpitation, chest discomfort or lower extremity swelling Gastrointestinal:  Denies nausea, heartburn or change in bowel habits Skin: Denies abnormal skin rashes Lymphatics: Denies new lymphadenopathy or easy bruising Neurological:Denies numbness, tingling or new weaknesses Behavioral/Psych: Mood is stable, no new changes  All other systems were reviewed with the patient and are negative.  I have reviewed the past medical history, past surgical history, social history and family history with the patient and they are unchanged from previous note.  ALLERGIES:  has No Known Allergies.  MEDICATIONS:  Current Outpatient Prescriptions  Medication Sig Dispense Refill  . acetaminophen (TYLENOL) 325 MG tablet Take 650 mg by mouth every 6 (six) hours as needed (pain).    Marland Kitchen aspirin EC 81 MG tablet Take 81 mg by mouth every morning.     Marland Kitchen buPROPion (WELLBUTRIN XL) 150 MG 24 hr tablet Take 150 mg by mouth every morning.    . cholecalciferol (VITAMIN D) 400 UNITS TABS tablet Take 800 Units by mouth daily.    . cycloSPORINE (RESTASIS) 0.05 % ophthalmic emulsion Place 1 drop into both eyes 2 (two) times daily.    Marland Kitchen docusate sodium (COLACE) 100 MG capsule Take 1 capsule (100 mg total) by mouth 2 (two) times daily. Take once a day if stool gets too soft. 60 capsule 2  . OVER THE COUNTER MEDICATION Take 1 each by mouth 3 (three) times daily. [employer]    . risperiDONE (RISPERDAL) 2 MG tablet Take 2 mg by mouth 2 (  two) times daily.    . simvastatin (ZOCOR) 10 MG tablet Take 10 mg by mouth at bedtime.    . Skin Protectants, Misc. (HYDROCERIN EX) Apply 1 application topically 2 (two) times daily. Bilateral palm     No current facility-administered medications for this visit.    PHYSICAL EXAMINATION: ECOG PERFORMANCE STATUS: 0 -  Asymptomatic  Filed Vitals:   03/05/14 0906  BP: 109/89  Pulse: 84  Temp: 98.6 F (37 C)  Resp: 23   Filed Weights   03/05/14 0906  Weight: 150 lb 4.8 oz (68.176 kg)    GENERAL:alert, no distress and comfortable SKIN: skin color, texture, turgor are normal, no rashes or significant lesions EYES: normal, Conjunctiva are pink and non-injected, sclera clear OROPHARYNX:no exudate, no erythema and lips, buccal mucosa, and tongue normal . Per dentition is noted NECK: supple, thyroid normal size, non-tender, without nodularity LYMPH:  no palpable lymphadenopathy in the cervical, axillary or inguinal LUNGS: clear to auscultation and percussion with normal breathing effort HEART: regular rate & rhythm and no murmurs and no lower extremity edema ABDOMEN:abdomen soft, non-tender and normal bowel sounds Musculoskeletal:no cyanosis of digits and no clubbing  NEURO: alert & oriented x 3 with fluent speech, no focal motor/sensory deficits  LABORATORY DATA:  I have reviewed the data as listed    Component Value Date/Time   NA 134* 03/04/2014 1013   NA 137 09/15/2013 0940   K 4.1 03/04/2014 1013   K 3.9 09/15/2013 0940   CL 99 09/15/2013 0940   CO2 29 03/04/2014 1013   CO2 28 09/15/2013 0940   GLUCOSE 106 03/04/2014 1013   GLUCOSE 112* 09/15/2013 0940   BUN 6.9* 03/04/2014 1013   BUN 9 09/15/2013 0940   CREATININE 0.8 03/04/2014 1013   CREATININE 0.62 09/15/2013 0940   CALCIUM 9.3 03/04/2014 1013   CALCIUM 10.0 09/15/2013 0940   PROT 6.5 03/04/2014 1013   PROT 6.5 09/07/2013 1430   ALBUMIN 3.8 03/04/2014 1013   ALBUMIN 3.3* 09/07/2013 1430   AST 11 03/04/2014 1013   AST 11 09/07/2013 1430   ALT 10 03/04/2014 1013   ALT 7 09/07/2013 1430   ALKPHOS 75 03/04/2014 1013   ALKPHOS 92 09/07/2013 1430   BILITOT 0.32 03/04/2014 1013   BILITOT <0.2* 09/07/2013 1430   GFRNONAA >90 09/15/2013 0940   GFRAA >90 09/15/2013 0940    No results found for: SPEP, UPEP  Lab Results   Component Value Date   WBC 5.3 03/04/2014   NEUTROABS 3.8 03/04/2014   HGB 13.7 03/04/2014   HCT 39.1 03/04/2014   MCV 93.1 03/04/2014   PLT 118* 03/04/2014      Chemistry      Component Value Date/Time   NA 134* 03/04/2014 1013   NA 137 09/15/2013 0940   K 4.1 03/04/2014 1013   K 3.9 09/15/2013 0940   CL 99 09/15/2013 0940   CO2 29 03/04/2014 1013   CO2 28 09/15/2013 0940   BUN 6.9* 03/04/2014 1013   BUN 9 09/15/2013 0940   CREATININE 0.8 03/04/2014 1013   CREATININE 0.62 09/15/2013 0940      Component Value Date/Time   CALCIUM 9.3 03/04/2014 1013   CALCIUM 10.0 09/15/2013 0940   ALKPHOS 75 03/04/2014 1013   ALKPHOS 92 09/07/2013 1430   AST 11 03/04/2014 1013   AST 11 09/07/2013 1430   ALT 10 03/04/2014 1013   ALT 7 09/07/2013 1430   BILITOT 0.32 03/04/2014 1013   BILITOT <0.2* 09/07/2013 1430  RADIOGRAPHIC STUDIES: I have personally reviewed the radiological images as listed and agreed with the findings in the report. Nm Pet Image Restag (ps) Skull Base To Thigh  03/04/2014   CLINICAL DATA:  Subsequent Treatment strategy for laryngeal carcinoma. Status post radiation therapy.  EXAM: NUCLEAR MEDICINE PET SKULL BASE TO THIGH  TECHNIQUE: 8.3 mCi F-18 FDG was injected intravenously. Full-ring PET imaging was performed from the skull base to thigh after the radiotracer. CT data was obtained and used for attenuation correction and anatomic localization.  FASTING BLOOD GLUCOSE:  Value: 113 mg/dl  COMPARISON:  08/26/2013  FINDINGS: NECK  Residual soft tissue thickening in the supraglottic area but the bulky mass is no longer visualized. Residual diffuse FDG uptake with SUV max of 4.1. Residual tumor versus radiation affect. Resolution of hypermetabolic neck adenopathy. No supraclavicular disease.  CHEST  No hypermetabolic mediastinal or hilar nodes. No suspicious pulmonary nodules on the CT scan. Advanced emphysematous changes. No metastatic pulmonary disease. A small  pericardial effusion is noted. Three-vessel coronary artery calcifications are demonstrated.  ABDOMEN/PELVIS  No abnormal hypermetabolic activity within the liver, pancreas, adrenal glands, or spleen. No hypermetabolic lymph nodes in the abdomen or pelvis. Small focus of FDG uptake in the splenic flexure region of the colon could be a diverticulum. No mass is seen.  SKELETON  No focal hypermetabolic activity to suggest skeletal metastasis.  IMPRESSION: PET-CT findings demonstrate good response to therapy. Minimal residual soft tissue thickening in the supraglottic larynx and low level FDG uptake could be due to radiation effect. Resolution of the hypermetabolic neck adenopathy.  No findings for metastatic disease in the chest, abdomen or pelvis.   Electronically Signed   By: Kalman Jewels M.D.   On: 03/04/2014 13:32     ASSESSMENT & PLAN:  Malignant neoplasm of supraglottis He has complete response to treatment. I recommend continue follow-up with radiation oncologist and ENT. I will get his case for next radiology review at the ENT tumor board. I will arrange for his port to be removed. I will discharge him from my office.   Tobacco abuse I spent some time counseling the patient the importance of tobacco cessation. He is currently not interested to quit now.     Orders Placed This Encounter  Procedures  . IR Removal Tun Access W/ Port W/O FL    Standing Status: Future     Number of Occurrences:      Standing Expiration Date: 05/04/2015    Order Specific Question:  Reason for exam:    Answer:  remove port no longer needed    Order Specific Question:  Preferred Imaging Location?    Answer:  [facility]   All questions were answered. The patient knows to call the clinic with any problems, questions or concerns. No barriers to learning was detected. I spent 25 minutes counseling the patient face to face. The total time spent in the appointment was 30 minutes and more than 50% was  on counseling and review of test results     [facility], [place], MD 03/05/2014 9:47 AM

## 2014-03-05 NOTE — Progress Notes (Signed)
Radiation Oncology         [phone] ________________________________  Name: Andrew Osborn MRN: [id number]  Date: 03/05/2014  DOB: 1956/01/26  Follow-Up Visit Note  CC: PROVIDER NOT IN SYSTEM  Ruby Cola, MD  Diagnosis and Prior Radiotherapy:       ICD-9-CM ICD-10-CM   1. Malignant neoplasm of supraglottis 161.1 C32.1     [id number] Supraglottic Squamous Cell Carcinoma, STAGE IVA    Indication for treatment:  Curative, with chemotherapy       Radiation treatment dates:   09/16/2013-11/09/13  SITE/DOSE: supraglottis and bilateral neck / 70 Gy in 35 fractions to gross disease, 63 Gy in 35 fractions to high risk nodal echelons, and 56 Gy in 35 fractions to intermediate risk nodal echelons  Narrative:  The patient returns today for routine follow-up.  Patient denies pain, difficulty eating/swallowing, loss of appetite, fatigue. He continues to smoke 10-15 cigarettes daily.   Patient seen by Dr Alvy Bimler this morning. He continues to live in a group home. Voice is less hoarse.    PET scan shows good response to ChRT.                  ALLERGIES:  has No Known Allergies.  Meds: Current Outpatient Prescriptions  Medication Sig Dispense Refill  . acetaminophen (TYLENOL) 325 MG tablet Take 650 mg by mouth every 6 (six) hours as needed (pain).    Marland Kitchen aspirin EC 81 MG tablet Take 81 mg by mouth every morning.     Marland Kitchen buPROPion (WELLBUTRIN XL) 150 MG 24 hr tablet Take 150 mg by mouth every morning.    . cholecalciferol (VITAMIN D) 400 UNITS TABS tablet Take 800 Units by mouth daily.    . cycloSPORINE (RESTASIS) 0.05 % ophthalmic emulsion Place 1 drop into both eyes 2 (two) times daily.    Marland Kitchen docusate sodium (COLACE) 100 MG capsule Take 1 capsule (100 mg total) by mouth 2 (two) times daily. Take once a day if stool gets too soft. 60 capsule 2  . OVER THE COUNTER MEDICATION Take 1 each by mouth 3 (three) times daily. [employer]    . risperiDONE (RISPERDAL) 2 MG tablet Take 2 mg by mouth  2 (two) times daily.    . simvastatin (ZOCOR) 10 MG tablet Take 10 mg by mouth at bedtime.    . Skin Protectants, Misc. (HYDROCERIN EX) Apply 1 application topically 2 (two) times daily. Bilateral palm     No current facility-administered medications for this encounter.    Physical Findings:  Wt Readings from Last 3 Encounters:  03/05/14 150 lb 4.8 oz (68.176 kg)  12/15/13 157 lb 1.6 oz (71.26 kg)  12/07/13 152 lb (68.947 kg)    The patient is in no acute distress. Patient is alert and oriented.  blood pressure is 109/89 and his pulse is 84. His respiration is 22. Marland Kitchen  Oropharyngeal mucosa is intact with no thrush or lesions. Poor dentition. Voice is less hoarse. No palpable cervical or supraclavicular lymphadenopathy. Skin  intact and smooth over neck.  HEART RRR, CHEST CTAB  PROCEDURE NOTE: After anesthetizing the nasal cavity with topical lidocaine and phenylephrine, the flexible endoscope was introduced and passed through the nasal cavity. Pharynx unremarkable.  Notable for swelling of supraglottis.  I could not pass scope beyond epiglottis. No obvious tumor, but exam limited due to swelling.     Lab Findings: Lab Results  Component Value Date   WBC 5.3 03/04/2014   HGB 13.7 03/04/2014  HCT 39.1 03/04/2014   MCV 93.1 03/04/2014   PLT 118* 03/04/2014    Lab Results  Component Value Date   TSH 2.046 03/04/2014    Radiographic Findings: Nm Pet Image Restag (ps) Skull Base To Thigh  03/04/2014   CLINICAL DATA:  Subsequent Treatment strategy for laryngeal carcinoma. Status post radiation therapy.  EXAM: NUCLEAR MEDICINE PET SKULL BASE TO THIGH  TECHNIQUE: 8.3 mCi F-18 FDG was injected intravenously. Full-ring PET imaging was performed from the skull base to thigh after the radiotracer. CT data was obtained and used for attenuation correction and anatomic localization.  FASTING BLOOD GLUCOSE:  Value: 113 mg/dl  COMPARISON:  08/26/2013  FINDINGS: NECK  Residual soft tissue  thickening in the supraglottic area but the bulky mass is no longer visualized. Residual diffuse FDG uptake with SUV max of 4.1. Residual tumor versus radiation affect. Resolution of hypermetabolic neck adenopathy. No supraclavicular disease.  CHEST  No hypermetabolic mediastinal or hilar nodes. No suspicious pulmonary nodules on the CT scan. Advanced emphysematous changes. No metastatic pulmonary disease. A small pericardial effusion is noted. Three-vessel coronary artery calcifications are demonstrated.  ABDOMEN/PELVIS  No abnormal hypermetabolic activity within the liver, pancreas, adrenal glands, or spleen. No hypermetabolic lymph nodes in the abdomen or pelvis. Small focus of FDG uptake in the splenic flexure region of the colon could be a diverticulum. No mass is seen.  SKELETON  No focal hypermetabolic activity to suggest skeletal metastasis.  IMPRESSION: PET-CT findings demonstrate good response to therapy. Minimal residual soft tissue thickening in the supraglottic larynx and low level FDG uptake could be due to radiation effect. Resolution of the hypermetabolic neck adenopathy.  No findings for metastatic disease in the chest, abdomen or pelvis.   Electronically Signed   By: Kalman Jewels M.D.   On: 03/04/2014 13:32   Impression/Plan:    1) Head and Neck Cancer Status: NED; will discuss PET at tumor board.  I will tentatively plan on ordering a CT of neck/chest in 80mo to verify he is still NED.  2) Nutritional Status: - weight: increasing - PEG tube: flushing only  3) Risk Factors: The patient has been educated about risk factors including alcohol and tobacco abuse; they understand that avoidance of alcohol and tobacco is important to prevent recurrences as well as other cancers. Still smoking.    4) Swallowing: good  5) Dental: Encouraged to continue regular followup with dentistry, and dental hygiene including fluoride rinses.  Plan is to remove all remaining teeth with alveoloplasty and  pre-prosthetic surgery as indicated in the operating room with general anesthesia.   Will need to have adequate healing of vocal cord area for use of general anesthesia. This area is still edematous.  6) Thyroid function: normal TSH  7) Social: Lives in group home.   8) Other:   Follow-up in 6 months with me with CT of neck / chest and TSH lab.  F/u with ENT in 2 months - Gayleen Orem, RN, our Head and Neck Oncology Navigator will refer back.   _____________________________________   Eppie Gibson, MD

## 2014-03-08 ENCOUNTER — Other Ambulatory Visit: Payer: Self-pay | Admitting: Radiology

## 2014-03-08 ENCOUNTER — Telehealth: Payer: Self-pay | Admitting: *Deleted

## 2014-03-08 NOTE — Telephone Encounter (Signed)
CALLED PATIENT TO INFORM OF LABS, TEST AND FU VISIT, SPOKE WITH A LORRAINE JACKSON (NURSE) AND GAVE HER ALL INFO. REGARDING THESE APPTS. (THIS PATIENT LIVES IN AN ASSISTED LIVING CENTER)

## 2014-03-08 NOTE — Telephone Encounter (Signed)
Per Dr. Pearlie Oyster f/u with patient last Friday, I spoke with Andrew Osborn at [facility], requested they contact patient to arrange routine 2 month f/u with Dr. Simeon Craft.  She verbalized understanding.  Gayleen Orem, RN, BSN, [place] [facility] [phone]

## 2014-03-09 ENCOUNTER — Ambulatory Visit (HOSPITAL_COMMUNITY)
Admission: RE | Admit: 2014-03-09 | Discharge: 2014-03-09 | Disposition: A | Discharge status: Home / Self Care | Payer: Medicare Other | Source: Ambulatory Visit | Attending: Hematology and Oncology | Admitting: Hematology and Oncology

## 2014-03-09 DIAGNOSIS — Z79899 Other long term (current) drug therapy: Secondary | ICD-10-CM | POA: Insufficient documentation

## 2014-03-09 DIAGNOSIS — J449 Chronic obstructive pulmonary disease, unspecified: Secondary | ICD-10-CM | POA: Diagnosis not present

## 2014-03-09 DIAGNOSIS — F17210 Nicotine dependence, cigarettes, uncomplicated: Secondary | ICD-10-CM | POA: Diagnosis not present

## 2014-03-09 DIAGNOSIS — R58 Hemorrhage, not elsewhere classified: Secondary | ICD-10-CM | POA: Insufficient documentation

## 2014-03-09 DIAGNOSIS — F209 Schizophrenia, unspecified: Secondary | ICD-10-CM | POA: Diagnosis not present

## 2014-03-09 DIAGNOSIS — I10 Essential (primary) hypertension: Secondary | ICD-10-CM | POA: Insufficient documentation

## 2014-03-09 DIAGNOSIS — Z7982 Long term (current) use of aspirin: Secondary | ICD-10-CM | POA: Diagnosis not present

## 2014-03-09 DIAGNOSIS — Z452 Encounter for adjustment and management of vascular access device: Secondary | ICD-10-CM | POA: Insufficient documentation

## 2014-03-09 DIAGNOSIS — E785 Hyperlipidemia, unspecified: Secondary | ICD-10-CM | POA: Insufficient documentation

## 2014-03-09 DIAGNOSIS — E669 Obesity, unspecified: Secondary | ICD-10-CM | POA: Insufficient documentation

## 2014-03-09 DIAGNOSIS — C321 Malignant neoplasm of supraglottis: Secondary | ICD-10-CM | POA: Insufficient documentation

## 2014-03-09 DIAGNOSIS — Z923 Personal history of irradiation: Secondary | ICD-10-CM | POA: Insufficient documentation

## 2014-03-09 DIAGNOSIS — Z801 Family history of malignant neoplasm of trachea, bronchus and lung: Secondary | ICD-10-CM | POA: Insufficient documentation

## 2014-03-09 LAB — CBC WITH DIFFERENTIAL/PLATELET
BASOS ABS: 0 10*3/uL (ref 0.0–0.1)
BASOS PCT: 0 % (ref 0–1)
EOS ABS: 0.2 10*3/uL (ref 0.0–0.7)
Eosinophils Relative: 3 % (ref 0–5)
HCT: 37.5 % — ABNORMAL LOW (ref 39.0–52.0)
HEMOGLOBIN: 13.1 g/dL (ref 13.0–17.0)
LYMPHS PCT: 10 % — AB (ref 12–46)
Lymphs Abs: 0.6 10*3/uL — ABNORMAL LOW (ref 0.7–4.0)
MCH: 32.7 pg (ref 26.0–34.0)
MCHC: 34.9 g/dL (ref 30.0–36.0)
MCV: 93.5 fL (ref 78.0–100.0)
Monocytes Absolute: 0.6 10*3/uL (ref 0.1–1.0)
Monocytes Relative: 12 % (ref 3–12)
NEUTROS ABS: 4.1 10*3/uL (ref 1.7–7.7)
NEUTROS PCT: 75 % (ref 43–77)
Platelets: 134 10*3/uL — ABNORMAL LOW (ref 150–400)
RBC: 4.01 MIL/uL — AB (ref 4.22–5.81)
RDW: 14.1 % (ref 11.5–15.5)
WBC: 5.5 10*3/uL (ref 4.0–10.5)

## 2014-03-09 LAB — PROTIME-INR
INR: 0.92 (ref 0.00–1.49)
PROTHROMBIN TIME: 12.4 s (ref 11.6–15.2)

## 2014-03-09 MED ORDER — CEFAZOLIN SODIUM-DEXTROSE 2-3 GM-% IV SOLR
2.0000 g | Freq: Once | INTRAVENOUS | Status: AC
  Administered 2014-03-09: 2 g via INTRAVENOUS

## 2014-03-09 MED ORDER — CEFAZOLIN SODIUM-DEXTROSE 2-3 GM-% IV SOLR
INTRAVENOUS | Status: AC
Start: 2014-03-09 — End: 2014-03-09
  Filled 2014-03-09: qty 50

## 2014-03-09 MED ORDER — FENTANYL CITRATE 0.05 MG/ML IJ SOLN
INTRAMUSCULAR | Status: AC
  Filled 2014-03-09: qty 4

## 2014-03-09 MED ORDER — LIDOCAINE HCL 1 % IJ SOLN
INTRAMUSCULAR | Status: AC
  Filled 2014-03-09: qty 20

## 2014-03-09 MED ORDER — MIDAZOLAM HCL 2 MG/2ML IJ SOLN
INTRAMUSCULAR | Status: AC
  Filled 2014-03-09: qty 6

## 2014-03-09 MED ORDER — SODIUM CHLORIDE 0.9 % IV SOLN
INTRAVENOUS | Status: DC
  Administered 2014-03-09: 08:00:00 via INTRAVENOUS

## 2014-03-09 NOTE — H&P (Signed)
Chief Complaint: "I'm here to get my port out"  Referring Physician(s): Gorsuch,Ni  History of Present Illness: Andrew Osborn is a 59 y.o. 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 He presents today for port a cath removal .  Past Medical History  Diagnosis Date  . Obesity   . Swallowing difficulty   . Schizophrenia   . 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 Hypertension   . Hyperlipidemia   . COPD (chronic obstructive pulmonary disease)   . Tobacco abuse 08/25/2013  . Throat pain 09/17/2013  . Constipation 09/17/2013  . Thrush 10/28/2013  . S/P radiation therapy Completed 11/09/13     7000  cGy- Supraglottic larynx/bilateral neck    Past Surgical History  Procedure Laterality Date  . Direct laryngoscopy N/A 08/13/2013    Procedure: DIRECT LARYNGOSCOPY;  Surgeon: Ruby Cola, MD;  Location: [facility];  Service: ENT;  Laterality: N/A;  Direct Laryngoscopy with biopsy.  . Inguinal hernia repair      as 59 year old    Allergies: Haldol  Medications: Prior to Admission medications   Medication Sig Start Date End Date Taking? Authorizing Provider  acetaminophen (TYLENOL) 325 MG tablet Take 650 mg by mouth every 6 (six) hours as needed (pain).   Yes Historical Provider, MD  Alum & Mag Hydroxide-Simeth (MAGIC MOUTHWASH W/LIDOCAINE) SOLN Take 5 mLs by mouth 4 (four) times daily.   Yes Historical Provider, MD  aspirin EC 81 MG tablet Take 81 mg by mouth every morning.    Yes Historical Provider, MD  buPROPion (WELLBUTRIN XL) 150 MG 24 hr tablet Take 150 mg by mouth every morning.   Yes Historical Provider, MD  cholecalciferol (VITAMIN D) 400 UNITS TABS tablet Take 800 Units by mouth daily.   Yes Historical Provider, MD  cycloSPORINE (RESTASIS) 0.05 % ophthalmic emulsion Place 1 drop into both eyes 2 (two) times daily.   Yes Historical Provider, MD  docusate sodium (COLACE) 100 MG capsule Take 1 capsule (100 mg total)  by mouth 2 (two) times daily. Take once a day if stool gets too soft. 10/26/13  Yes Eppie Gibson, MD  lidocaine-prilocaine (EMLA) cream Apply 1 application topically as needed (To numb port).   Yes Historical Provider, MD  OVER THE COUNTER MEDICATION Take 1 each by mouth 3 (three) times daily. Great Shakes   Yes Historical Provider, MD  polyethylene glycol (MIRALAX / GLYCOLAX) packet Take 17 g by mouth daily.   Yes Historical Provider, MD  promethazine (PHENERGAN) 25 MG tablet Take 25 mg by mouth every 6 (six) hours as needed for nausea or vomiting.   Yes Historical Provider, MD  risperiDONE (RISPERDAL) 2 MG tablet Take 2 mg by mouth 2 (two) times daily.   Yes Historical Provider, MD  simvastatin (ZOCOR) 10 MG tablet Take 10 mg by mouth at bedtime.   Yes Historical Provider, MD  Skin Protectants, Misc. (HYDROCERIN EX) Apply 1 application topically 2 (two) times daily. Bilateral palm   Yes Historical Provider, MD  guaiFENesin (ROBITUSSIN) 100 MG/5ML SOLN Take 5 mLs by mouth every 4 (four) hours as needed for cough or to loosen phlegm.    Historical Provider, MD    Family History  Problem Relation Age of Onset  . Cancer Mother     lung cancer  . Cancer Father     lung cancer    History   Social History  . Marital Status: Single  Spouse Name: N/A    Number of Children: N/A  . Years of Education: N/A   Social History Main Topics  . Smoking status: Current Every Day Smoker -- 0.50 packs/day for 20 years    Types: Cigarettes  . Smokeless tobacco: Never Used     Comment: 10/20/13 about 2-3 cigs daily,   03/05/14 smoking 10-15 cigs daily  . Alcohol Use: No  . Drug Use: No  . Sexual Activity: Not on file   Other Topics Concern  . Not on file   Social History Narrative      Review of Systems  Constitutional: Negative for fever and chills.  HENT:       Hoarseness; dry mouth  Respiratory:       Occ dyspnea/cough with exertion  Cardiovascular: Negative for chest pain.    Gastrointestinal: Negative for nausea, vomiting, abdominal pain and blood in stool.  Genitourinary: Negative for dysuria and hematuria.  Musculoskeletal:       Occ back pain  Neurological: Negative for headaches.  Hematological: Bruises/bleeds easily.    Vital Signs: BP 133/85 mmHg  Pulse 88  Temp(Src) 97.6 F (36.4 C) (Oral)  Resp 22  SpO2 96%  Physical Exam  Constitutional: He is oriented to person, place, and time. He appears well-developed and well-nourished.  Cardiovascular: Normal rate and regular rhythm.   Clean, intact rt chest wall PAC  Pulmonary/Chest:  Distant BS bilat  Abdominal: Soft. Bowel sounds are normal. There is no tenderness.  Musculoskeletal: Normal range of motion. He exhibits no edema.  Neurological: He is alert and oriented to person, place, and time.  Skin:  Few scatt echymoses    Imaging: Nm Pet Image Restag (ps) Skull Base To Thigh  03/04/2014   CLINICAL DATA:  Subsequent Treatment strategy for laryngeal carcinoma. Status post radiation therapy.  EXAM: NUCLEAR MEDICINE PET SKULL BASE TO THIGH  TECHNIQUE: 8.3 mCi F-18 FDG was injected intravenously. Full-ring PET imaging was performed from the skull base to thigh after the radiotracer. CT data was obtained and used for attenuation correction and anatomic localization.  FASTING BLOOD GLUCOSE:  Value: 113 mg/dl  COMPARISON:  08/26/2013  FINDINGS: NECK  Residual soft tissue thickening in the supraglottic area but the bulky mass is no longer visualized. Residual diffuse FDG uptake with SUV max of 4.1. Residual tumor versus radiation affect. Resolution of hypermetabolic neck adenopathy. No supraclavicular disease.  CHEST  No hypermetabolic mediastinal or hilar nodes. No suspicious pulmonary nodules on the CT scan. Advanced emphysematous changes. No metastatic pulmonary disease. A small pericardial effusion is noted. Three-vessel coronary artery calcifications are demonstrated.  ABDOMEN/PELVIS  No abnormal  hypermetabolic activity within the liver, pancreas, adrenal glands, or spleen. No hypermetabolic lymph nodes in the abdomen or pelvis. Small focus of FDG uptake in the splenic flexure region of the colon could be a diverticulum. No mass is seen.  SKELETON  No focal hypermetabolic activity to suggest skeletal metastasis.  IMPRESSION: PET-CT findings demonstrate good response to therapy. Minimal residual soft tissue thickening in the supraglottic larynx and low level FDG uptake could be due to radiation effect. Resolution of the hypermetabolic neck adenopathy.  No findings for metastatic disease in the chest, abdomen or pelvis.   Electronically Signed   By: Kalman Jewels M.D.   On: 03/04/2014 13:32    Labs:  CBC:  Recent Labs  11/06/13 1242 12/15/13 1233 03/04/14 1012 03/09/14 0750  WBC 3.2* 4.8 5.3 5.5  HGB 10.6* 11.9* 13.7 13.1  HCT 31.9*  35.1* 39.1 37.5*  PLT 114* 107* 118* 134*    COAGS:  Recent Labs  09/15/13 0940 03/09/14 0750  INR 0.98 0.92  APTT 28  --     BMP:  Recent Labs  08/13/13 0950 09/07/13 1430 09/15/13 0940  10/27/13 0919 11/06/13 1242 12/15/13 1234 03/04/14 1013  NA 134* 131* 137  < > 136 133* 128* 134*  K 4.2 4.5 3.9  < > 4.0 4.2 4.3 4.1  CL 94* 93* 99  --   --   --   --   --   CO2 27 27 28   < > 30* 30* 27 29  GLUCOSE 106* 130* 112*  < > 83 86 103 106  BUN 7 11 9   < > 12.3 14.0 13.3 6.9*  CALCIUM 9.5 9.7 10.0  < > 9.1 9.4 9.4 9.3  CREATININE 0.63 0.70 0.62  < > 0.8 0.8 0.8 0.8  GFRNONAA >90  --  >90  --   --   --   --   --   GFRAA >90  --  >90  --   --   --   --   --   < > = values in this interval not displayed.  LIVER FUNCTION TESTS:  Recent Labs  10/27/13 0919 11/06/13 1242 12/15/13 1234 03/04/14 1013  BILITOT 0.27 0.26 0.26 0.32  AST 9 11 9 11   ALT <6 9 <6 10  ALKPHOS 58 64 66 75  PROT 6.1* 6.5 6.2* 6.5  ALBUMIN 3.2* 3.5 3.6 3.8    TUMOR MARKERS: No results for input(s): AFPTM, CEA, CA199, CHROMGRNA in the last 8760  hours.  Assessment and Plan: Andrew Osborn is a 59 y.o. 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 He presents today for port a cath removal . Details/risks of procedure d/w pt with his understanding and consent.    Signed: Autumn Messing 03/09/2014, 9:10 AM

## 2014-03-09 NOTE — Progress Notes (Signed)
Call to [facility] to send driver to pick up patient.

## 2014-03-09 NOTE — Discharge Instructions (Signed)
No change in medications or diet. No heavy lifting or heavy work with right arm. Leave dressing in place for 24 hours them may remove and shower as usual. Call [facility] [phone] for any problems or questions.    Incision Care An incision is when a surgeon cuts into your body tissues. After surgery, the incision needs to be cared for properly to prevent infection.  HOME CARE INSTRUCTIONS   Take all medicine as directed by your caregiver. Only take over-the-counter or prescription medicines for pain, discomfort, or fever as directed by your caregiver.  Do not remove your bandage (dressing) or get your incision wet until your surgeon gives you permission. In the event that your dressing becomes wet, dirty, or starts to smell, change the dressing and call your surgeon for instructions as soon as possible.  Take showers. Do not take tub baths, swim, or do anything that may soak the wound until it is healed.  Resume your normal diet and activities as directed or allowed.  Avoid lifting any weight until you are instructed otherwise.  Use anti-itch antihistamine medicine as directed by your caregiver. The wound may itch when it is healing. Do not pick or scratch at the wound.  Follow up with your caregiver for stitch (suture) or staple removal as directed.  Drink enough fluids to keep your urine clear or pale yellow. SEEK MEDICAL CARE IF:   You have redness, swelling, or increasing pain in the wound that is not controlled with medicine.  You have drainage, blood, or pus coming from the wound that lasts longer than 1 day.  You develop muscle aches, chills, or a general ill feeling.  You notice a bad smell coming from the wound or dressing.  Your wound edges separate after the sutures, staples, or skin adhesive strips have been removed.  You develop persistent nausea or vomiting. SEEK IMMEDIATE MEDICAL CARE IF:   You have a fever.  You develop a rash.  You develop dizzy  episodes or faint while standing.  You have difficulty breathing.  You develop any reaction or side effects to medicine given. MAKE SURE YOU:   Understand these instructions.  Will watch your condition.  Will get help right away if you are not doing well or get worse. Document Released: 08/04/2004 Document Revised: 04/09/2011 Document Reviewed: 03/11/2013 [facility] Patient Information 2015 [place], [place]. This information is not intended to replace advice given to you by your health care provider. Make sure you discuss any questions you have with your health care provider. Conscious Sedation Sedation is the use of medicines to promote relaxation and relieve discomfort and anxiety. Conscious sedation is a type of sedation. Under conscious sedation you are less alert than normal but are still able to respond to instructions or stimulation. Conscious sedation is used during short medical and dental procedures. It is milder than deep sedation or general anesthesia and allows you to return to your regular activities sooner.  LET [facility] CARE PROVIDER KNOW ABOUT:   Any allergies you have.  All medicines you are taking, including vitamins, herbs, eye drops, creams, and over-the-counter medicines.  Use of steroids (by mouth or creams).  Previous problems you or members of your family have had with the use of anesthetics.  Any blood disorders you have.  Previous surgeries you have had.  Medical conditions you have.  Possibility of pregnancy, if this applies.  Use of cigarettes, alcohol, or illegal drugs. RISKS AND COMPLICATIONS Generally, this is a safe procedure.  However, as with any procedure, problems can occur. Possible problems include:  Oversedation.  Trouble breathing on your own. You may need to have a breathing tube until you are awake and breathing on your own.  Allergic reaction to any of the medicines used for the procedure. BEFORE THE PROCEDURE  You may have blood  tests done. These tests can help show how well your kidneys and liver are working. They can also show how well your blood clots.  A physical exam will be done.  Only take medicines as directed by your health care provider. You may need to stop taking medicines (such as blood thinners, aspirin, or nonsteroidal anti-inflammatory drugs) before the procedure.   Do not eat or drink at least 6 hours before the procedure or as directed by your health care provider.  Arrange for a responsible adult, family member, or friend to take you home after the procedure. He or she should stay with you for at least 24 hours after the procedure, until the medicine has worn off. PROCEDURE   An intravenous (IV) catheter will be inserted into one of your veins. Medicine will be able to flow directly into your body through this catheter. You may be given medicine through this tube to help prevent pain and help you relax.  The medical or dental procedure will be done. AFTER THE PROCEDURE  You will stay in a recovery area until the medicine has worn off. Your blood pressure and pulse will be checked.   Depending on the procedure you had, you may be allowed to go home when you can tolerate liquids and your pain is under control. Document Released: 10/10/2000 Document Revised: 01/20/2013 Document Reviewed: 09/22/2012 [facility] Patient Information 2015 [place], [place]. This information is not intended to replace advice given to you by your health care provider. Make sure you discuss any questions you have with your health care provider.

## 2014-03-09 NOTE — Procedures (Signed)
Successful removal of the right chest Port.  No immediate complication.  See imaging report.

## 2014-03-12 NOTE — Addendum Note (Signed)
Encounter addended by: Andria Rhein, RN on: 03/12/2014  8:10 AM<BR>     Documentation filed: Charges VN

## 2014-03-16 ENCOUNTER — Ambulatory Visit: Payer: Medicare Other | Attending: Radiation Oncology

## 2014-03-16 DIAGNOSIS — Z5189 Encounter for other specified aftercare: Secondary | ICD-10-CM | POA: Insufficient documentation

## 2014-03-16 DIAGNOSIS — R1310 Dysphagia, unspecified: Secondary | ICD-10-CM | POA: Diagnosis not present

## 2014-03-16 DIAGNOSIS — R1312 Dysphagia, oropharyngeal phase: Secondary | ICD-10-CM

## 2014-03-16 NOTE — Patient Instructions (Addendum)
   SWALLOW HARD 20 TIMES  SWALLOW HARD WITH TONGUE OUT 20 TIMES  SAY "HE" AS HIGH AS YOU CAN (MICHAEL JACKSON!) 20 TIMES  START SWALLOWING BUT SQUEEZE TIGHT AND HOLD 5 SECONDS, 20 TIMES  SAY "HUH" AND HOLD BREATH 3 SECONDS, 20 TIMES  MOUTH OPEN, PUSH YOUR CHIN WITH YOUR FIST FOR 5 SECONDS, 10 TIMES

## 2014-03-16 NOTE — Therapy (Signed)
[facility] [address] [place], [place], [address] Phone: [phone]   Fax:  [phone]  Speech Language Pathology Treatment  Patient Details  Name: Andrew Osborn MRN: [id number] Date of Birth: 19-Mar-1955 Referring Provider:  Eppie Gibson, MD  Encounter Date: 03/16/2014      End of Session - 03/16/14 1142    Visit Number 5   Number of Visits 7   Date for SLP Re-Evaluation 05/14/14   SLP Start Time 2505   SLP Stop Time  1018   SLP Time Calculation (min) 40 min   Activity Tolerance Patient tolerated treatment well      Past Medical History  Diagnosis Date  . Obesity   . Swallowing difficulty   . Schizophrenia   . 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 Hypertension   . Hyperlipidemia   . COPD (chronic obstructive pulmonary disease)   . Tobacco abuse 08/25/2013  . Throat pain 09/17/2013  . Constipation 09/17/2013  . Thrush 10/28/2013  . S/P radiation therapy Completed 11/09/13     7000  cGy- Supraglottic larynx/bilateral neck    Past Surgical History  Procedure Laterality Date  . Direct laryngoscopy N/A 08/13/2013    Procedure: DIRECT LARYNGOSCOPY;  Surgeon: Ruby Cola, MD;  Location: [facility];  Service: ENT;  Laterality: N/A;  Direct Laryngoscopy with biopsy.  . Inguinal hernia repair      as 59 year old    There were no vitals taken for this visit.  Visit Diagnosis: Dysphagia, oropharyngeal - Plan: SLP plan of care cert/re-cert      Subjective Assessment - 03/16/14 1141    Symptoms Eating dys III. reportedly.             ADULT SLP TREATMENT - 03/16/14 1133    General Information   Behavior/Cognition Alert;Cooperative;Pleasant mood   Treatment Provided   Treatment provided Dysphagia   Dysphagia Treatment   Respiratory Status --  lungs CTAB at Dr. Pearlie Oyster visit with pt 03-05-14   Oral Cavity - Dentition Poor condition;Missing dentition   Treatment Methods Skilled  observation;Upgraded PO texture trial;Therapeutic exercise;Patient/caregiver education   Patient observed directly with PO's Yes   Type of PO's observed Regular;Dysphagia 3 (soft);Thin liquids   Feeding Able to feed self   Liquids provided via Cup   Oral Phase Signs & Symptoms --  prolonged mastication due to poor/missing dentition   Pharyngeal Phase Signs & Symptoms Immediate throat clear;Immediate cough  with granola bar; with cereal bar (dysIII)-NO overt s/s   Other treatment/comments Pt's pharyngeal stage appeared impaired with granola bar due to immediate cough and throat clear. With cereal bar ("Nutri-Grain") *no overt signs/symptoms observed. Pt reported not completing HEP as directed. SLP reviewed rationale and procedure for HEP, inlcuding duration/frequency. Pt req'd min A occasionally for procedure.   Pain Assessment   Pain Assessment No/denies pain   Assessment / Recommendations / Plan   Plan Continue with current plan of care   Dysphagia Recommendations   Diet recommendations Dysphagia 3 (mechanical soft);Thin liquid   Progression Toward Goals   Progression toward goals Progressing toward goals          SLP Education - 03/16/14 1131    Education provided Yes   Education Details HEP procedure and duration, need for soft (dysIII) diet, no crunchy/dry/crumbly food   Person(s) Educated Patient   Methods Explanation;Demonstration;Handout   Comprehension Verbalized understanding;Returned demonstration          SLP Short Term  Goals - Apr 05, 2014 1144    SLP SHORT TERM GOAL #1   Title pt will tell SLP why he is completing HEP with mod A   Time 1   Period --  visits   Status On-going  continued   SLP SHORT TERM GOAL #2   Title pt will complete HEP with rare min A   Time 1   Period --  visits   Status On-going  continued   SLP SHORT TERM GOAL #3   Title pt will tell SLP two practical foods to eat while healing from head/neck rad tx   Time 1   Period --  visits    Status On-going  continued          SLP Long Term Goals - 04-05-14 0943    SLP LONG TERM GOAL #1   Title pt will complete HEP with modified independence   Time 3   Period --  visits   Status On-going  continued   SLP LONG TERM GOAL #2   Title pt will tell SLP why he is completing HEP with occasional min A   Time 3   Period --  visits   Status On-going  continued          Plan - 2014-04-05 1143    Clinical Impression Statement pt cont to require skilled ST to maintain independence with HEP as well as to assess current PO diet.   Speech Therapy Frequency --  approx once per month   Duration --  in 60 days   Treatment/Interventions Pharyngeal strengthening exercises;Diet toleration management by SLP;Trials of upgraded texture/liquids;Patient/family education;SLP instruction and feedback;Compensatory strategies   Potential to Achieve Goals Good   Potential Considerations Family/community support;Previous level of function   Consulted and Agree with Plan of Care Patient          G-Codes - Apr 05, 2014 1147    Functional Assessment Tool Used noms   Functional Limitations Swallowing   Swallow Current Status [phone]) At least 20 percent but less than 40 percent impaired, limited or restricted   Swallow Goal Status (D3267) At least 1 percent but less than 20 percent impaired, limited or restricted      Problem List Patient Active Problem List   Diagnosis Date Noted  . Thrombocytopenia 12/15/2013  . Thrush 10/28/2013  . 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 Hyponatremia 10/07/2013  . Protein calorie malnutrition 09/23/2013  . Throat pain 09/17/2013  . Constipation 09/17/2013  . S/P gastrostomy 09/17/2013  . Hypertension 09/07/2013  . Schizophrenia 09/07/2013  . Narrowing of airway 09/07/2013  . Tobacco abuse 08/25/2013  . Decay, teeth 08/25/2013  . 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 [place], [place], [address] Phone: [phone]   Fax:  [phone]

## 2014-04-06 ENCOUNTER — Ambulatory Visit: Payer: Medicare Other | Attending: Radiation Oncology

## 2014-04-06 ENCOUNTER — Telehealth: Payer: Self-pay | Admitting: *Deleted

## 2014-04-06 DIAGNOSIS — Z5189 Encounter for other specified aftercare: Secondary | ICD-10-CM | POA: Insufficient documentation

## 2014-04-06 DIAGNOSIS — R1310 Dysphagia, unspecified: Secondary | ICD-10-CM | POA: Diagnosis not present

## 2014-04-06 DIAGNOSIS — R1313 Dysphagia, pharyngeal phase: Secondary | ICD-10-CM

## 2014-04-06 NOTE — Patient Instructions (Signed)
You must complete your exercises at least twice a day to keep your muscles soft.

## 2014-04-06 NOTE — Telephone Encounter (Signed)
Confirmed with [facility], that patient has appt on 04/20/14 @ 1:20 with Dr. Constance Holster.  Pt being seen by Dr. Constance Holster at Dr. Theressa Millard request.  Gayleen Orem, RN, BSN, [facility] [place] [phone]

## 2014-04-06 NOTE — Therapy (Signed)
[facility] [address] [place], [place], [address] Phone: [phone]   Fax:  [phone]  Speech Language Pathology Treatment  Patient Details  Name: Andrew Osborn MRN: [id number] Date of Birth: Nov 28, 1955 Referring Provider:  Eppie Gibson, MD  Encounter Date: 04/06/2014      End of Session - 04/06/14 1525    Visit Number 6   Number of Visits 7   Date for SLP Re-Evaluation 05/14/14   SLP Start Time 0850   SLP Stop Time  0930   SLP Time Calculation (min) 40 min   Activity Tolerance Patient tolerated treatment well      Past Medical History  Diagnosis Date  . Obesity   . Swallowing difficulty   . Schizophrenia   . 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 Hypertension   . Hyperlipidemia   . COPD (chronic obstructive pulmonary disease)   . Tobacco abuse 08/25/2013  . Throat pain 09/17/2013  . Constipation 09/17/2013  . Thrush 10/28/2013  . S/P radiation therapy Completed 11/09/13     7000  cGy- Supraglottic larynx/bilateral neck    Past Surgical History  Procedure Laterality Date  . Direct laryngoscopy N/A 08/13/2013    Procedure: DIRECT LARYNGOSCOPY;  Surgeon: Ruby Cola, MD;  Location: [facility];  Service: ENT;  Laterality: N/A;  Direct Laryngoscopy with biopsy.  . Inguinal hernia repair      as 59 year old    There were no vitals taken for this visit.  Visit Diagnosis: Dysphagia, pharyngeal      Subjective Assessment - 04/06/14 0904    Symptoms Continued reportedly eating dys III, thin. Pt with audible inspiration today. Improved vocal quality             ADULT SLP TREATMENT - 04/06/14 0907    General Information   Behavior/Cognition Alert;Cooperative;Pleasant mood   Treatment Provided   Treatment provided Dysphagia   Dysphagia Treatment   Respiratory Status --  Audible respiration-supraglottic or glottic level? MD notifi   Oral Cavity - Dentition Poor condition   Treatment Methods Skilled observation;Therapeutic exercise;Patient/caregiver education   Patient observed directly with PO's Yes   Type of PO's observed Dysphagia 3 (soft);Thin liquids   Feeding Able to feed self   Liquids provided via Cup   Oral Phase Signs & Symptoms --  no overt s/s difficulty   Pharyngeal Phase Signs & Symptoms Delayed throat clear  1/13 swallows   Other treatment/comments Pt smelled heavily of smoke. He req'd min A from SLP occasionally for procedure with HEP. With dys III/thin pt with delayed throat clear 1/13 swallows. Pt reported completing HEP less than prescribed frequency.  SLP had to cue pt mod-max for why he is completing HEP.   Pain Assessment   Pain Assessment No/denies pain   Dysphagia Recommendations   Diet recommendations Dysphagia 3 (mechanical soft);Thin liquid   Compensations Small sips/bites   Progression Toward Goals   Progression toward goals Progressing toward goals          SLP Education - 04/06/14 1524    Education Details HEP procedure, small bites/sips    Person(s) Educated Patient   Methods Explanation   Comprehension Verbalized understanding;Returned demonstration;Verbal cues required          SLP Short Term Goals - 03/16/14 1144    SLP SHORT TERM GOAL #1   Title pt will tell SLP why he is completing HEP with mod A   Time 1   Period --  visits   Status On-going  continued   SLP SHORT TERM GOAL #2   Title pt will complete HEP with rare min A   Time 1   Period --  visits   Status On-going  continued   SLP SHORT TERM GOAL #3   Title pt will tell SLP two practical foods to eat while healing from head/neck rad tx   Time 1   Period --  visits   Status On-going  continued          SLP Long Term Goals - 04/06/14 1529    SLP LONG TERM GOAL #1   Title pt will complete HEP with modified independence   Time 3   Period --  visits   Status On-going  continued   SLP LONG TERM GOAL #2   Title pt will tell SLP why he  is completing HEP with occasional min A   Time 3   Period --  visits   Status On-going  continued          Plan - 04/06/14 1526    Clinical Impression Statement Skilled ST req'd to continue due to pt's requiring cues for HEP and to cont safety assessment with POs.   Speech Therapy Frequency --  approx every four weeks   Duration --  for 60 days   Treatment/Interventions Pharyngeal strengthening exercises;Diet toleration management by SLP;Trials of upgraded texture/liquids;Patient/family education;SLP instruction and feedback;Compensatory strategies   Potential to Achieve Goals Fair   Potential Considerations Family/community support;Previous level of function;Cooperation/participation level   SLP Home Exercise Plan SLP strongly stressed to pt he must complete HEP as prescribed (at least twice/day) to allow best opportunity to retain WNL swallowing ability   Consulted and Agree with Plan of Care Patient        Problem List Patient Active Problem List   Diagnosis Date Noted  . Thrombocytopenia 12/15/2013  . Thrush 10/28/2013  . 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 Hyponatremia 10/07/2013  . Protein calorie malnutrition 09/23/2013  . Throat pain 09/17/2013  . Constipation 09/17/2013  . S/P gastrostomy 09/17/2013  . Hypertension 09/07/2013  . Schizophrenia 09/07/2013  . Narrowing of airway 09/07/2013  . Tobacco abuse 08/25/2013  . Decay, teeth 08/25/2013  . 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 [place], [place], [address] Phone: [phone]   Fax:  [phone]

## 2014-04-22 ENCOUNTER — Other Ambulatory Visit: Payer: Self-pay | Admitting: Otolaryngology

## 2014-04-22 DIAGNOSIS — Z8521 Personal history of malignant neoplasm of larynx: Secondary | ICD-10-CM

## 2014-04-22 DIAGNOSIS — F17200 Nicotine dependence, unspecified, uncomplicated: Secondary | ICD-10-CM

## 2014-04-26 ENCOUNTER — Telehealth: Payer: Self-pay | Admitting: *Deleted

## 2014-04-26 NOTE — Telephone Encounter (Signed)
I confirmed with [facility], that patient had follow-up appt with Dr. Simeon Craft on 04/20/14.  Gayleen Orem, RN, BSN, [facility] [place] [phone]

## 2014-04-29 ENCOUNTER — Other Ambulatory Visit: Payer: Self-pay

## 2014-05-04 ENCOUNTER — Ambulatory Visit: Payer: Medicare Other | Attending: Radiation Oncology

## 2014-05-04 DIAGNOSIS — R1310 Dysphagia, unspecified: Secondary | ICD-10-CM | POA: Diagnosis not present

## 2014-05-04 DIAGNOSIS — Z5189 Encounter for other specified aftercare: Secondary | ICD-10-CM | POA: Diagnosis present

## 2014-05-04 NOTE — Patient Instructions (Signed)
Signs of Aspiration Pneumonia   . Chest pain/tightness . Fever (can be low grade) . Cough  o With foul-smelling phlegm (sputum) o With sputum containing pus or blood o With greenish sputum . Fatigue  . Shortness of breath  . Wheezing   **IF YOU HAVE THESE SIGNS, CONTACT YOUR DOCTOR OR GO TO THE EMERGENCY DEPARTMENT OR URGENT CARE AS SOON AS POSSIBLE**      

## 2014-05-04 NOTE — Therapy (Signed)
[facility] [address] [place], [place], [address] Phone: [phone]   Fax:  [phone]  Speech Language Pathology Treatment  Patient Details  Name: Andrew Osborn MRN: [id number] Date of Birth: 01-07-1956 Referring Provider:  Eppie Gibson, MD  Encounter Date: 05/04/2014      End of Session - 05/04/14 1404    Authorization Type NEEDS [facility] RENEWAL during May visit      Past Medical History  Diagnosis Date  . Obesity   . Swallowing difficulty   . Schizophrenia   . 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 Hypertension   . Hyperlipidemia   . COPD (chronic obstructive pulmonary disease)   . Tobacco abuse 08/25/2013  . Throat pain 09/17/2013  . Constipation 09/17/2013  . Thrush 10/28/2013  . S/P radiation therapy Completed 11/09/13     7000  cGy- Supraglottic larynx/bilateral neck    Past Surgical History  Procedure Laterality Date  . Direct laryngoscopy N/A 08/13/2013    Procedure: DIRECT LARYNGOSCOPY;  Surgeon: Ruby Cola, MD;  Location: [facility];  Service: ENT;  Laterality: N/A;  Direct Laryngoscopy with biopsy.  . Inguinal hernia repair      as 59 year old    There were no vitals filed for this visit.  Visit Diagnosis: Dysphagia      Subjective Assessment - 05/04/14 1329    Subjective Pt stated he has been eating potato chips. Saw Dr. Simeon Craft 04-20-14, ordered CT scan of throat for Thursday 05-06-14.               ADULT SLP TREATMENT - 05/04/14 1347    General Information   Behavior/Cognition Alert;Cooperative;Pleasant mood   Treatment Provided   Treatment provided Dysphagia   Dysphagia Treatment   Temperature Spikes Noted --  Unknown   Oral Cavity - Dentition Poor condition;Missing dentition   Treatment Methods Skilled observation;Therapeutic exercise;Compensation strategy training   Patient observed directly with PO's Yes   Type of PO's observed Dysphagia 3 (soft);Thin  liquids   Liquids provided via Cup   Oral Phase Signs & Symptoms --  prolonged mastication due to poor/mising dentition   Pharyngeal Phase Signs & Symptoms Delayed throat clear  1/7 swallows   Other treatment/comments SLP reminded pt best to stop smoking   Pain Assessment   Pain Assessment No/denies pain   Cognitive-Linquistic Treatment   Skilled Treatment Pt observed with cereal bar and water. Delayed throat clear on 1/7 swallows. With initial cue, pt with small sips/bites. SLP reiterated "soft and moist" foolds only. Ivin Booty, CNA told SLP she could A pt with HEP daily, upon request from SLP, due to pt requiring min A usually for exercises.   Assessment / Recommendations / Plan   Plan Continue with current plan of care   Dysphagia Recommendations   Diet recommendations Dysphagia 3 (mechanical soft);Thin liquid  small bites and sips   Progression Toward Goals   Progression toward goals Progressing toward goals          SLP Education - 05/04/14 1352    Education Details s/s aspiration PNA, HEP   Person(s) Educated Theatre stage manager)   Methods Explanation;Demonstration   Comprehension Verbalized understanding;Returned demonstration;Verbal cues required          SLP Short Term Goals - 05/04/14 1403    SLP SHORT TERM GOAL #1   Title pt will tell SLP why he is completing HEP with mod A   Time 1   Period --  visits  Status On-going  continued   SLP SHORT TERM GOAL #2   Title pt will complete HEP with occasional min A   Time 1   Period --  visits   Status Revised  continued   SLP SHORT TERM GOAL #3   Title pt will tell SLP two practical foods to eat while healing from head/neck rad tx   Time 1   Period --  visits   Status On-going  continued          SLP Long Term Goals - 05/04/14 1404    SLP LONG TERM GOAL #1   Title pt will complete HEP with rare min A   Time 2   Period --  visits   Status Revised  continued   SLP LONG TERM GOAL #2   Title pt will tell SLP  why he is completing HEP with occasional min A   Time 2   Period --  visits   Status On-going  continued          Plan - 05/04/14 1402    Clinical Impression Statement Skilled ST req'd to continue due to pt's requiring cues for HEP and to cont safety assessment with POs.   Speech Therapy Frequency --  approx every four weeks   Duration --  for 60 days   Treatment/Interventions Pharyngeal strengthening exercises;Diet toleration management by SLP;Trials of upgraded texture/liquids;Patient/family education;SLP instruction and feedback;Compensatory strategies   Potential to Achieve Goals Fair   Potential Considerations Family/community support;Previous level of function;Cooperation/participation level        Problem List Patient Active Problem List   Diagnosis Date Noted  . Thrombocytopenia 12/15/2013  . Thrush 10/28/2013  . 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 Hyponatremia 10/07/2013  . Protein calorie malnutrition 09/23/2013  . Throat pain 09/17/2013  . Constipation 09/17/2013  . S/P gastrostomy 09/17/2013  . Hypertension 09/07/2013  . Schizophrenia 09/07/2013  . Narrowing of airway 09/07/2013  . Tobacco abuse 08/25/2013  . Decay, teeth 08/25/2013  . 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 [place], [place], [address] Phone: [phone]   Fax:  [phone]

## 2014-05-06 ENCOUNTER — Ambulatory Visit
Admission: RE | Admit: 2014-05-06 | Discharge: 2014-05-06 | Disposition: A | Discharge status: Home / Self Care | Payer: Medicare Other | Source: Ambulatory Visit | Attending: Otolaryngology | Admitting: Otolaryngology

## 2014-05-06 DIAGNOSIS — F17200 Nicotine dependence, unspecified, uncomplicated: Secondary | ICD-10-CM

## 2014-05-06 DIAGNOSIS — Z8521 Personal history of malignant neoplasm of larynx: Secondary | ICD-10-CM

## 2014-05-06 MED ORDER — IOPAMIDOL (ISOVUE-300) INJECTION 61%
75.0000 mL | Freq: Once | INTRAVENOUS | Status: AC | PRN
  Administered 2014-05-06: 75 mL via INTRAVENOUS

## 2014-06-09 ENCOUNTER — Ambulatory Visit: Payer: Medicare Other | Attending: Radiation Oncology

## 2014-06-09 DIAGNOSIS — Z5189 Encounter for other specified aftercare: Secondary | ICD-10-CM | POA: Insufficient documentation

## 2014-06-09 DIAGNOSIS — R1310 Dysphagia, unspecified: Secondary | ICD-10-CM | POA: Insufficient documentation

## 2014-06-23 ENCOUNTER — Ambulatory Visit: Payer: Medicare Other

## 2014-06-23 DIAGNOSIS — R1310 Dysphagia, unspecified: Secondary | ICD-10-CM

## 2014-06-23 DIAGNOSIS — Z5189 Encounter for other specified aftercare: Secondary | ICD-10-CM | POA: Diagnosis not present

## 2014-06-23 NOTE — Therapy (Signed)
[facility] Os[facility] [address] [place], [place], [address] Phone: [phone]   Fax:  [phone]  Speech Language Pathology Treatment  Patient Details  Name: Andrew Osborn MRN: [id number] Date of Birth: May 07, 1955 Referring Provider:  Eppie Gibson, MD  Encounter Date: 06/23/2014      End of Session - 06/23/14 1107    SLP Start Time 62   SLP Stop Time  1053   SLP Time Calculation (min) 33 min   Activity Tolerance Patient tolerated treatment well      Past Medical History  Diagnosis Date  . Obesity   . Swallowing difficulty   . Schizophrenia   . 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 Hypertension   . Hyperlipidemia   . COPD (chronic obstructive pulmonary disease)   . Tobacco abuse 08/25/2013  . Throat pain 09/17/2013  . Constipation 09/17/2013  . Thrush 10/28/2013  . S/P radiation therapy Completed 11/09/13     7000  cGy- Supraglottic larynx/bilateral neck    Past Surgical History  Procedure Laterality Date  . Direct laryngoscopy N/A 08/13/2013    Procedure: DIRECT LARYNGOSCOPY;  Surgeon: Ruby Cola, MD;  Location: [facility];  Service: ENT;  Laterality: N/A;  Direct Laryngoscopy with biopsy.  . Inguinal hernia repair      as 59 year old    There were no vitals filed for this visit.  Visit Diagnosis: Dysphagia      Subjective Assessment - 06/23/14 1027    Subjective CT scan unremarkable; showed good response to tx. Ivin Booty states pt has "been talking out of his head" recently. Pt will be seen for one visit today is discharged. (Discharge summary below)               ADULT SLP TREATMENT - 06/23/14 1026    General Information   Behavior/Cognition Cooperative;Pleasant mood   HPI Finished radiation 11-09-13. CNA tells SLP that pt has refused exercises when she has asked him. Pt reports he completes exercise between noon-2pm approx once/day.   Treatment Provided   Treatment provided  Dysphagia   Dysphagia Treatment   Temperature Spikes Noted No   Oral Cavity - Dentition Poor condition;Missing dentition   Treatment Methods Skilled observation;Therapeutic exercise   Patient observed directly with PO's Yes   Type of PO's observed Dysphagia 3 (soft);Thin liquids   Oral Phase Signs & Symptoms --  none   Pharyngeal Phase Signs & Symptoms --  none   Other treatment/comments Pt completed HEP with occasional verbal and visual cues from SLP. Ivin Booty observed again and is comfortable assisting pt as needed at pt's facility. As above, no overt s/s aspiration with cereal bar and H2O. appropriate bite sizes/sip sizes noted. Pt told SLP why he was completing exercises with consistent min verbal cues. SLP stressed to pt multiple times in session "soft and moist" foods, and is completing HEP to keep muscles soft to keep food/liquid out of lungs.   Pain Assessment   Pain Assessment No/denies pain   Assessment / Recommendations / Plan   Plan --  d/c at this time;pt completing HEP with min A& safe with POs   Dysphagia Recommendations   Diet recommendations Dysphagia 3 (mechanical soft);Thin liquid   Compensations Small sips/bites   Progression Toward Goals   Progression toward goals --  discharge          SLP Education - 06/23/14 1103    Education provided Yes   Education Details HEP, why pt  completing HEP, soft and moist foods are necessary   Person(s) Educated Patient;Caregiver(s)   Methods Explanation;Demonstration;Verbal cues   Comprehension Verbalized understanding;Need further instruction;Verbal cues required          SLP Short Term Goals - Jul 04, 2014 1110    SLP SHORT TERM GOAL #1   Title pt will tell SLP why he is completing HEP with mod A   Status Achieved Continued for today   SLP SHORT TERM GOAL #2   Title pt will complete HEP with occasional min A   Status Not Met Continued for today   SLP SHORT TERM GOAL #3   Title pt will tell SLP two practical foods to  eat while healing from head/neck rad tx   Status Partially Met Continued for today          SLP Long Term Goals - 07/04/14 1111    SLP LONG TERM GOAL #1   Title pt will complete HEP with rare min A   Time 2   Period --  visits   Status Not Met  Continued for today   SLP LONG TERM GOAL #2   Title pt will tell SLP why he is completing HEP with occasional min A   Time 2   Period --  visits   Status Partially Met  Continued for today          Plan - 2014/07/04 1108    Clinical Impression Statement SLP suspects pt is not completing HEP daily due to more frequent cueing needed today's visit. Ivin Booty is able to cue pt PRN for correct exercise completion. No overt s/s aspiration/aspiration PNA since last visit. Pt is appropriate for d/c at this time. SLP stressed to pt need to complete HEP once/day and adhere to soft/moist foods.   Treatment/Interventions Pharyngeal strengthening exercises;Diet toleration management by SLP;Patient/family education;SLP instruction and feedback;Compensatory strategies   Potential Considerations Family/community support;Previous level of function;Cooperation/participation level          G-Codes - July 04, 2014 1110    Functional Assessment Tool Used noms   Functional Limitations Swallowing   Swallow Goal Status [phone]) At least 20 percent but less than 40 percent impaired, limited or restricted   Swallow Discharge Status [phone]) At least 20 percent but less than 40 percent impaired, limited or restricted      Problem List Patient Active Problem List   Diagnosis Date Noted  . Thrombocytopenia 12/15/2013  . Thrush 10/28/2013  . 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 Hyponatremia 10/07/2013  . Protein calorie malnutrition 09/23/2013  . Throat pain 09/17/2013  . Constipation 09/17/2013  . S/P gastrostomy 09/17/2013  . Hypertension 09/07/2013  . Schizophrenia 09/07/2013  . Narrowing of airway 09/07/2013  . Tobacco abuse 08/25/2013  . Decay, teeth  08/25/2013  . 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 Pt has been good (per pt report) at HEP compliance until the last span of time between appointments. Level of cueing for correct completion of HEP was increased compared to previous sessions.  Caregiver Ivin Booty (CNA) is comfortable helping pt with procedural correctness for HEP. SLP stressed to pt throughout therapy course why he is completing HEP - pt cont to need  cues for this as he has premorbid cognitive-linguistic deficits. Pt cont to attempt to eat dry crumbly foods such as potato chips despite staff and SLP encouraging him not to do so. SLP ensured pt could tell him "soft and moist" when asked what type of food he needed to eat in order to stay safe with his swallowing. Pt's goal achievement is as above under "SLP Long/Short Term Goals".   Remaining deficits: Suspected mild pharyngeal dysphagia   Education / Equipment: HEP, soft and moist foods are critical for swallowing safety, rationale to complete HEP  Plan: Patient agrees to discharge.  Patient goals were not met. Patient is being discharged due to meeting the stated rehab goals.  ?????reaching max rehab potential at this time.       [facility] , [place], [place]  06/23/2014, 11:13 AM  [facility] [address] [place], [place], [address] Phone: [phone]   Fax:  [phone]

## 2014-07-06 ENCOUNTER — Inpatient Hospital Stay (HOSPITAL_COMMUNITY): Payer: Medicare Other

## 2014-07-06 ENCOUNTER — Other Ambulatory Visit (HOSPITAL_COMMUNITY): Payer: Self-pay

## 2014-07-06 ENCOUNTER — Emergency Department (HOSPITAL_COMMUNITY): Payer: Medicare Other

## 2014-07-06 ENCOUNTER — Inpatient Hospital Stay (HOSPITAL_COMMUNITY)
Admission: EM | Admit: 2014-07-06 | Discharge: 2014-07-30 | DRG: 296 | Disposition: E | Payer: Medicare Other | Attending: Pulmonary Disease | Admitting: Pulmonary Disease

## 2014-07-06 DIAGNOSIS — F17210 Nicotine dependence, cigarettes, uncomplicated: Secondary | ICD-10-CM | POA: Diagnosis present

## 2014-07-06 DIAGNOSIS — J9601 Acute respiratory failure with hypoxia: Secondary | ICD-10-CM | POA: Diagnosis present

## 2014-07-06 DIAGNOSIS — R739 Hyperglycemia, unspecified: Secondary | ICD-10-CM | POA: Diagnosis present

## 2014-07-06 DIAGNOSIS — H5581 Saccadic eye movements: Secondary | ICD-10-CM | POA: Diagnosis present

## 2014-07-06 DIAGNOSIS — Z452 Encounter for adjustment and management of vascular access device: Secondary | ICD-10-CM | POA: Insufficient documentation

## 2014-07-06 DIAGNOSIS — Z66 Do not resuscitate: Secondary | ICD-10-CM | POA: Diagnosis present

## 2014-07-06 DIAGNOSIS — Z801 Family history of malignant neoplasm of trachea, bronchus and lung: Secondary | ICD-10-CM

## 2014-07-06 DIAGNOSIS — J449 Chronic obstructive pulmonary disease, unspecified: Secondary | ICD-10-CM | POA: Diagnosis present

## 2014-07-06 DIAGNOSIS — Z923 Personal history of irradiation: Secondary | ICD-10-CM

## 2014-07-06 DIAGNOSIS — D649 Anemia, unspecified: Secondary | ICD-10-CM | POA: Diagnosis present

## 2014-07-06 DIAGNOSIS — Z85819 Personal history of malignant neoplasm of unspecified site of lip, oral cavity, and pharynx: Secondary | ICD-10-CM

## 2014-07-06 DIAGNOSIS — R569 Unspecified convulsions: Secondary | ICD-10-CM | POA: Diagnosis present

## 2014-07-06 DIAGNOSIS — G931 Anoxic brain damage, not elsewhere classified: Secondary | ICD-10-CM | POA: Diagnosis present

## 2014-07-06 DIAGNOSIS — J9621 Acute and chronic respiratory failure with hypoxia: Secondary | ICD-10-CM | POA: Diagnosis not present

## 2014-07-06 DIAGNOSIS — I10 Essential (primary) hypertension: Secondary | ICD-10-CM | POA: Diagnosis present

## 2014-07-06 DIAGNOSIS — R0902 Hypoxemia: Secondary | ICD-10-CM

## 2014-07-06 DIAGNOSIS — I4891 Unspecified atrial fibrillation: Secondary | ICD-10-CM | POA: Diagnosis present

## 2014-07-06 DIAGNOSIS — R570 Cardiogenic shock: Secondary | ICD-10-CM | POA: Diagnosis present

## 2014-07-06 DIAGNOSIS — J9600 Acute respiratory failure, unspecified whether with hypoxia or hypercapnia: Secondary | ICD-10-CM

## 2014-07-06 DIAGNOSIS — G9340 Encephalopathy, unspecified: Secondary | ICD-10-CM | POA: Diagnosis not present

## 2014-07-06 DIAGNOSIS — D696 Thrombocytopenia, unspecified: Secondary | ICD-10-CM | POA: Diagnosis present

## 2014-07-06 DIAGNOSIS — E785 Hyperlipidemia, unspecified: Secondary | ICD-10-CM | POA: Diagnosis present

## 2014-07-06 DIAGNOSIS — L8990 Pressure ulcer of unspecified site, unspecified stage: Secondary | ICD-10-CM | POA: Insufficient documentation

## 2014-07-06 DIAGNOSIS — R000 Tachycardia, unspecified: Secondary | ICD-10-CM | POA: Diagnosis not present

## 2014-07-06 DIAGNOSIS — I469 Cardiac arrest, cause unspecified: Principal | ICD-10-CM | POA: Diagnosis present

## 2014-07-06 DIAGNOSIS — N179 Acute kidney failure, unspecified: Secondary | ICD-10-CM | POA: Diagnosis not present

## 2014-07-06 DIAGNOSIS — R4020 Unspecified coma: Secondary | ICD-10-CM | POA: Insufficient documentation

## 2014-07-06 DIAGNOSIS — K922 Gastrointestinal hemorrhage, unspecified: Secondary | ICD-10-CM

## 2014-07-06 DIAGNOSIS — Z8521 Personal history of malignant neoplasm of larynx: Secondary | ICD-10-CM

## 2014-07-06 DIAGNOSIS — Z9221 Personal history of antineoplastic chemotherapy: Secondary | ICD-10-CM

## 2014-07-06 DIAGNOSIS — I491 Atrial premature depolarization: Secondary | ICD-10-CM | POA: Diagnosis present

## 2014-07-06 DIAGNOSIS — I4892 Unspecified atrial flutter: Secondary | ICD-10-CM | POA: Diagnosis present

## 2014-07-06 DIAGNOSIS — F209 Schizophrenia, unspecified: Secondary | ICD-10-CM | POA: Diagnosis present

## 2014-07-06 DIAGNOSIS — R4189 Other symptoms and signs involving cognitive functions and awareness: Secondary | ICD-10-CM

## 2014-07-06 DIAGNOSIS — R40243 Glasgow coma scale score 3-8: Secondary | ICD-10-CM | POA: Diagnosis not present

## 2014-07-06 DIAGNOSIS — G253 Myoclonus: Secondary | ICD-10-CM | POA: Diagnosis not present

## 2014-07-06 DIAGNOSIS — E871 Hypo-osmolality and hyponatremia: Secondary | ICD-10-CM | POA: Diagnosis present

## 2014-07-06 LAB — BASIC METABOLIC PANEL
Anion gap: 7 (ref 5–15)
Anion gap: 9 (ref 5–15)
BUN: 12 mg/dL (ref 6–20)
BUN: 12 mg/dL (ref 6–20)
CALCIUM: 7.7 mg/dL — AB (ref 8.9–10.3)
CALCIUM: 7.9 mg/dL — AB (ref 8.9–10.3)
CO2: 27 mmol/L (ref 22–32)
CO2: 29 mmol/L (ref 22–32)
CREATININE: 0.8 mg/dL (ref 0.61–1.24)
Chloride: 91 mmol/L — ABNORMAL LOW (ref 101–111)
Chloride: 87 mmol/L — ABNORMAL LOW (ref 101–111)
Creatinine, Ser: 0.81 mg/dL (ref 0.61–1.24)
GFR calc Af Amer: >60 mL/min (ref >60)
GLUCOSE: 189 mg/dL — AB (ref 65–99)
Glucose, Bld: 202 mg/dL — ABNORMAL HIGH (ref 65–99)
POTASSIUM: 4.1 mmol/L (ref 3.5–5.1)
POTASSIUM: 4.2 mmol/L (ref 3.5–5.1)
Sodium: 125 mmol/L — ABNORMAL LOW (ref 135–145)
Sodium: 125 mmol/L — ABNORMAL LOW (ref 135–145)

## 2014-07-06 LAB — URINE MICROSCOPIC-ADD ON

## 2014-07-06 LAB — SODIUM, URINE, RANDOM: Sodium, Ur: 17 mmol/L

## 2014-07-06 LAB — CBC WITH DIFFERENTIAL/PLATELET
BASOS ABS: 0 10*3/uL (ref 0.0–0.1)
Basophils Relative: 0 % (ref 0–1)
Eosinophils Absolute: 0 10*3/uL (ref 0.0–0.7)
Eosinophils Relative: 0 % (ref 0–5)
HCT: 32.6 % — ABNORMAL LOW (ref 39.0–52.0)
HEMOGLOBIN: 11.1 g/dL — AB (ref 13.0–17.0)
LYMPHS ABS: 0.2 10*3/uL — AB (ref 0.7–4.0)
LYMPHS PCT: 2 % — AB (ref 12–46)
MCH: 31.7 pg (ref 26.0–34.0)
MCHC: 34 g/dL (ref 30.0–36.0)
MCV: 93.1 fL (ref 78.0–100.0)
Monocytes Absolute: 0.6 10*3/uL (ref 0.1–1.0)
Monocytes Relative: 4 % (ref 3–12)
NEUTROS ABS: 12.1 10*3/uL — AB (ref 1.7–7.7)
Neutrophils Relative %: 94 % — ABNORMAL HIGH (ref 43–77)
Platelets: 106 10*3/uL — ABNORMAL LOW (ref 150–400)
RBC: 3.5 MIL/uL — ABNORMAL LOW (ref 4.22–5.81)
RDW: 14.3 % (ref 11.5–15.5)
WBC: 12.9 10*3/uL — ABNORMAL HIGH (ref 4.0–10.5)

## 2014-07-06 LAB — PROCALCITONIN

## 2014-07-06 LAB — I-STAT TROPONIN, ED: TROPONIN I, POC: 0.07 ng/mL (ref 0.00–0.08)

## 2014-07-06 LAB — PROTIME-INR
INR: 1.14 (ref 0.00–1.49)
PROTHROMBIN TIME: 14.8 s (ref 11.6–15.2)

## 2014-07-06 LAB — URINALYSIS, ROUTINE W REFLEX MICROSCOPIC
Bilirubin Urine: NEGATIVE
GLUCOSE, UA: NEGATIVE mg/dL
KETONES UR: NEGATIVE mg/dL
Leukocytes, UA: NEGATIVE
Nitrite: NEGATIVE
PROTEIN: 100 mg/dL — AB
SPECIFIC GRAVITY, URINE: 1.013 (ref 1.005–1.030)
Urobilinogen, UA: 1 mg/dL (ref 0.0–1.0)
pH: 5.5 (ref 5.0–8.0)

## 2014-07-06 LAB — POCT I-STAT 3, ART BLOOD GAS (G3+)
BICARBONATE: 32.5 meq/L — AB (ref 20.0–24.0)
O2 SAT: 65 %
PCO2 ART: 93 mmHg — AB (ref 35.0–45.0)
PO2 ART: 45 mmHg — AB (ref 80.0–100.0)
TCO2: 35 mmol/L (ref 0–100)
pH, Arterial: 7.152 — CL (ref 7.350–7.450)

## 2014-07-06 LAB — MAGNESIUM: Magnesium: 1.7 mg/dL (ref 1.7–2.4)

## 2014-07-06 LAB — PHOSPHORUS: PHOSPHORUS: 5.2 mg/dL — AB (ref 2.5–4.6)

## 2014-07-06 LAB — LACTIC ACID, PLASMA: Lactic Acid, Venous: 2.8 mmol/L (ref 0.5–2.0)

## 2014-07-06 LAB — CORTISOL: Cortisol, Plasma: 47.5 ug/dL

## 2014-07-06 LAB — GLUCOSE, CAPILLARY
GLUCOSE-CAPILLARY: 194 mg/dL — AB (ref 65–99)
GLUCOSE-CAPILLARY: 105 mg/dL — AB (ref 65–99)

## 2014-07-06 LAB — OSMOLALITY, URINE: Osmolality, Ur: 328 mOsm/kg — ABNORMAL LOW (ref 390–1090)

## 2014-07-06 LAB — I-STAT CG4 LACTIC ACID, ED: Lactic Acid, Venous: 3.75 mmol/L (ref 0.5–2.0)

## 2014-07-06 LAB — TROPONIN I
Troponin I: 0.08 ng/mL — ABNORMAL HIGH (ref <0.031)
Troponin I: 0.2 ng/mL — ABNORMAL HIGH (ref <0.031)

## 2014-07-06 LAB — APTT: aPTT: 31 seconds (ref 24–37)

## 2014-07-06 LAB — MRSA PCR SCREENING: MRSA by PCR: NEGATIVE

## 2014-07-06 LAB — TSH: TSH: 1.459 u[IU]/mL (ref 0.350–4.500)

## 2014-07-06 MED ORDER — VALPROATE SODIUM 500 MG/5ML IV SOLN
500.0000 mg | Freq: Three times a day (TID) | INTRAVENOUS | Status: DC
  Administered 2014-07-06 – 2014-07-14 (×24): 500 mg via INTRAVENOUS
  Filled 2014-07-06 (×26): qty 5

## 2014-07-06 MED ORDER — PANTOPRAZOLE SODIUM 40 MG IV SOLR
40.0000 mg | Freq: Every day | INTRAVENOUS | Status: DC
  Administered 2014-07-06 – 2014-07-07 (×2): 40 mg via INTRAVENOUS
  Filled 2014-07-06 (×3): qty 40

## 2014-07-06 MED ORDER — ASPIRIN 300 MG RE SUPP: 300.0000 mg | RECTAL | Status: DC

## 2014-07-06 MED ORDER — NOREPINEPHRINE BITARTRATE 1 MG/ML IV SOLN
0.0000 ug/min | INTRAVENOUS | Status: DC
  Administered 2014-07-06: 5 ug/min via INTRAVENOUS
  Filled 2014-07-06: qty 4

## 2014-07-06 MED ORDER — SODIUM CHLORIDE 0.9 % IV SOLN
Freq: Once | INTRAVENOUS | Status: AC
  Administered 2014-07-06: 12:00:00 via INTRAVENOUS

## 2014-07-06 MED ORDER — SODIUM CHLORIDE 0.9 % IV SOLN
1.0000 mg/h | INTRAVENOUS | Status: DC
  Filled 2014-07-06: qty 10

## 2014-07-06 MED ORDER — FENTANYL CITRATE (PF) 100 MCG/2ML IJ SOLN: 100.0000 ug | INTRAMUSCULAR | Status: DC | PRN

## 2014-07-06 MED ORDER — MIDAZOLAM HCL 2 MG/2ML IJ SOLN
2.0000 mg | INTRAMUSCULAR | Status: DC | PRN
  Administered 2014-07-06 (×4): 2 mg via INTRAVENOUS
  Filled 2014-07-06 (×4): qty 2

## 2014-07-06 MED ORDER — DEXTROSE 5 % IV SOLN
0.0000 ug/min | INTRAVENOUS | Status: DC
  Administered 2014-07-06: 14 ug/min via INTRAVENOUS
  Administered 2014-07-07 (×3): 10 ug/min via INTRAVENOUS
  Administered 2014-07-08: 5 ug/min via INTRAVENOUS
  Administered 2014-07-08: 10 ug/min via INTRAVENOUS
  Filled 2014-07-06 (×9): qty 4

## 2014-07-06 MED ORDER — SODIUM CHLORIDE 0.9 % IV SOLN
1.0000 ug/kg/min | INTRAVENOUS | Status: DC
  Filled 2014-07-06: qty 20

## 2014-07-06 MED ORDER — SODIUM CHLORIDE 0.9 % IV BOLUS (SEPSIS)
1000.0000 mL | Freq: Once | INTRAVENOUS | Status: AC
  Administered 2014-07-06: 1000 mL via INTRAVENOUS

## 2014-07-06 MED ORDER — VALPROATE SODIUM 500 MG/5ML IV SOLN: 500.0000 mg | Freq: Two times a day (BID) | INTRAVENOUS | Status: DC

## 2014-07-06 MED ORDER — SODIUM CHLORIDE 0.9 % IV SOLN: 2000.0000 mL | Freq: Once | INTRAVENOUS | Status: DC

## 2014-07-06 MED ORDER — SODIUM CHLORIDE 0.9 % IV SOLN: 250.0000 mL | INTRAVENOUS | Status: DC | PRN

## 2014-07-06 MED ORDER — VALPROATE SODIUM 500 MG/5ML IV SOLN
500.0000 mg | Freq: Once | INTRAVENOUS | Status: AC
  Administered 2014-07-06: 500 mg via INTRAVENOUS
  Filled 2014-07-06 (×2): qty 5

## 2014-07-06 MED ORDER — SODIUM CHLORIDE 0.9 % IV SOLN
INTRAVENOUS | Status: DC
  Administered 2014-07-08 – 2014-07-09 (×2): via INTRAVENOUS

## 2014-07-06 MED ORDER — CYCLOSPORINE 0.05 % OP EMUL
1.0000 [drp] | Freq: Two times a day (BID) | OPHTHALMIC | Status: DC
  Administered 2014-07-07 – 2014-07-11 (×9): 1 [drp] via OPHTHALMIC
  Filled 2014-07-06 (×14): qty 1

## 2014-07-06 MED ORDER — SODIUM CHLORIDE 0.9 % IV SOLN
2000.0000 mL | Freq: Once | INTRAVENOUS | Status: AC
  Administered 2014-07-06: 1000 mL via INTRAVENOUS

## 2014-07-06 MED ORDER — ASPIRIN 325 MG PO TABS
325.0000 mg | ORAL_TABLET | Freq: Every day | ORAL | Status: DC
  Administered 2014-07-07 – 2014-07-11 (×5): 325 mg
  Filled 2014-07-06 (×6): qty 1

## 2014-07-06 MED ORDER — SODIUM CHLORIDE 0.9 % IV SOLN
25.0000 ug/h | INTRAVENOUS | Status: DC
  Filled 2014-07-06: qty 50

## 2014-07-06 MED ORDER — MIDAZOLAM HCL 2 MG/2ML IJ SOLN
INTRAMUSCULAR | Status: AC
  Filled 2014-07-06: qty 2

## 2014-07-06 MED ORDER — ASPIRIN 81 MG PO CHEW: 324.0000 mg | CHEWABLE_TABLET | ORAL | Status: DC

## 2014-07-06 MED ORDER — MIDAZOLAM HCL 2 MG/2ML IJ SOLN
1.0000 mg | Freq: Once | INTRAMUSCULAR | Status: AC
  Administered 2014-07-06: 1 mg via INTRAVENOUS

## 2014-07-06 NOTE — ED Provider Notes (Signed)
CSN: [id number]     Arrival date & time    History   First MD Initiated Contact with Patient 07/13/2014 1011     Chief Complaint  Patient presents with  . Cardiac Arrest     (Consider location/radiation/quality/duration/timing/severity/associated sxs/prior Treatment) Patient is a 59 y.o. male presenting with altered mental status. The history is provided by the patient. The history is limited by the absence of a caregiver and the condition of the patient. No language interpreter was used.  Altered Mental Status Presenting symptoms: unresponsiveness   Severity:  Severe Most recent episode:  Today Episode history:  Single Timing:  Constant Progression:  Unchanged Chronicity:  New Context comment:  Hx of larygneal cancer   Past Medical History  Diagnosis Date  . Obesity   . Swallowing difficulty   . Schizophrenia   . 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 Hypertension   . Hyperlipidemia   . COPD (chronic obstructive pulmonary disease)   . Tobacco abuse 08/25/2013  . Throat pain 09/17/2013  . Constipation 09/17/2013  . Thrush 10/28/2013  . S/P radiation therapy Completed 11/09/13     7000  cGy- Supraglottic larynx/bilateral neck   Past Surgical History  Procedure Laterality Date  . Direct laryngoscopy N/A 08/13/2013    Procedure: DIRECT LARYNGOSCOPY;  Surgeon: Ruby Cola, MD;  Location: [facility];  Service: ENT;  Laterality: N/A;  Direct Laryngoscopy with biopsy.  . Inguinal hernia repair      as 59 year old   Family History  Problem Relation Age of Onset  . Cancer Mother     lung cancer  . Cancer Father     lung cancer   History  Substance Use Topics  . Smoking status: Current Every Day Smoker -- 0.50 packs/day for 20 years    Types: Cigarettes  . Smokeless tobacco: Never Used     Comment: 10/20/13 about 2-3 cigs daily,   03/05/14 smoking 10-15 cigs daily  . Alcohol Use: No    Review of Systems  Unable to perform ROS     Allergies  Haldol  Home  Medications   Prior to Admission medications   Medication Sig Start Date End Date Taking? Authorizing Provider  acetaminophen (TYLENOL) 325 MG tablet Take 650 mg by mouth every 6 (six) hours as needed (pain).   Yes Historical Provider, MD  aspirin EC 81 MG tablet Take 81 mg by mouth every morning.    Yes Historical Provider, MD  buPROPion (WELLBUTRIN XL) 150 MG 24 hr tablet Take 150 mg by mouth every morning.   Yes Historical Provider, MD  cholecalciferol (VITAMIN D) 400 UNITS TABS tablet Take 800 Units by mouth daily.   Yes Historical Provider, MD  cycloSPORINE (RESTASIS) 0.05 % ophthalmic emulsion Place 1 drop into both eyes 2 (two) times daily.   Yes Historical Provider, MD  docusate sodium (COLACE) 100 MG capsule Take 1 capsule (100 mg total) by mouth 2 (two) times daily. Take once a day if stool gets too soft. 10/26/13  Yes Eppie Gibson, MD  guaiFENesin (ROBITUSSIN) 100 MG/5ML SOLN Take 5 mLs by mouth every 4 (four) hours as needed for cough or to loosen phlegm.   Yes Historical Provider, MD  OVER THE COUNTER MEDICATION Take 1 each by mouth 3 (three) times daily. Great Shakes   Yes Historical Provider, MD  polyethylene glycol (MIRALAX / GLYCOLAX) packet Take 17 g by mouth daily.   Yes Historical Provider, MD  promethazine (PHENERGAN) 25 MG  tablet Take 25 mg by mouth every 6 (six) hours as needed for nausea or vomiting.   Yes Historical Provider, MD  risperiDONE (RISPERDAL) 2 MG tablet Take 2 mg by mouth 2 (two) times daily.   Yes Historical Provider, MD  simvastatin (ZOCOR) 10 MG tablet Take 10 mg by mouth at bedtime.   Yes Historical Provider, MD  Skin Protectants, Misc. (HYDROCERIN EX) Apply 1 application topically 2 (two) times daily. Bilateral palm   Yes Historical Provider, MD   BP 101/58 mmHg  Pulse 88  Temp(Src) 100.1 F (37.8 C) (Core (Comment))  Resp 26  Wt 159 lb 2.8 oz (72.2 kg)  SpO2 94% Physical Exam  Constitutional: He is oriented to person, place, and time. He appears  well-developed and well-nourished. He appears distressed.  HENT:  Head: Normocephalic and atraumatic.  Mouth/Throat: No oropharyngeal exudate.  Eyes: Pupils are equal, round, and reactive to light.  Pupils 75mm sluggish  Neck: Normal range of motion. Neck supple.  Cardiovascular: Regular rhythm and normal heart sounds.  Tachycardia present.  Exam reveals no gallop and no friction rub.   No murmur heard. Pulmonary/Chest: Effort normal and breath sounds normal. Apnea noted. No respiratory distress. He has no wheezes. He has no rales.  Abdominal: Soft. Bowel sounds are normal. He exhibits no distension and no mass. There is no tenderness. There is no rebound and no guarding.  Musculoskeletal: Normal range of motion. He exhibits no edema or tenderness.  Neurological: He is alert and oriented to person, place, and time. GCS eye subscore is 1. GCS verbal subscore is 1. GCS motor subscore is 1.  No spontaneous breaths.   Skin: Skin is warm and dry.  Psychiatric: He has a normal mood and affect.    ED Course  Procedures (including critical care time) Labs Review Labs Reviewed  CBC WITH DIFFERENTIAL/PLATELET - Abnormal; Notable for the following:    WBC 12.9 (*)    RBC 3.50 (*)    Hemoglobin 11.1 (*)    HCT 32.6 (*)    Platelets 106 (*)    Neutrophils Relative % 94 (*)    Neutro Abs 12.1 (*)    Lymphocytes Relative 2 (*)    Lymphs Abs 0.2 (*)    All other components within normal limits  BASIC METABOLIC PANEL - Abnormal; Notable for the following:    Sodium 125 (*)    Chloride 91 (*)    Glucose, Bld 189 (*)    Calcium 7.7 (*)    All other components within normal limits  URINALYSIS, ROUTINE W REFLEX MICROSCOPIC (NOT AT [facility]) - Abnormal; Notable for the following:    APPearance CLOUDY (*)    Hgb urine dipstick LARGE (*)    Protein, ur 100 (*)    All other components within normal limits  PHOSPHORUS - Abnormal; Notable for the following:    Phosphorus 5.2 (*)    All other components  within normal limits  LACTIC ACID, PLASMA - Abnormal; Notable for the following:    Lactic Acid, Venous 2.8 (*)    All other components within normal limits  TROPONIN I - Abnormal; Notable for the following:    Troponin I 0.08 (*)    All other components within normal limits  TROPONIN I - Abnormal; Notable for the following:    Troponin I 0.20 (*)    All other components within normal limits  TROPONIN I - Abnormal; Notable for the following:    Troponin I 0.29 (*)  All other components within normal limits  BASIC METABOLIC PANEL - Abnormal; Notable for the following:    Sodium 125 (*)    Chloride 87 (*)    Glucose, Bld 202 (*)    Calcium 7.9 (*)    All other components within normal limits  BASIC METABOLIC PANEL - Abnormal; Notable for the following:    Sodium 127 (*)    Chloride 91 (*)    Glucose, Bld 125 (*)    Calcium 7.3 (*)    All other components within normal limits  OSMOLALITY, URINE - Abnormal; Notable for the following:    Osmolality, Ur 328 (*)    All other components within normal limits  URINE MICROSCOPIC-ADD ON - Abnormal; Notable for the following:    Casts HYALINE CASTS (*)    All other components within normal limits  GLUCOSE, CAPILLARY - Abnormal; Notable for the following:    Glucose-Capillary 194 (*)    All other components within normal limits  CBC - Abnormal; Notable for the following:    RBC 3.69 (*)    Hemoglobin 11.6 (*)    HCT 33.5 (*)    Platelets 109 (*)    All other components within normal limits  BASIC METABOLIC PANEL - Abnormal; Notable for the following:    Sodium 128 (*)    Chloride 91 (*)    Glucose, Bld 114 (*)    Calcium 7.8 (*)    All other components within normal limits  MAGNESIUM - Abnormal; Notable for the following:    Magnesium 1.3 (*)    All other components within normal limits  PHOSPHORUS - Abnormal; Notable for the following:    Phosphorus 1.9 (*)    All other components within normal limits  TROPONIN I - Abnormal;  Notable for the following:    Troponin I 0.25 (*)    All other components within normal limits  GLUCOSE, CAPILLARY - Abnormal; Notable for the following:    Glucose-Capillary 105 (*)    All other components within normal limits  GLUCOSE, CAPILLARY - Abnormal; Notable for the following:    Glucose-Capillary 115 (*)    All other components within normal limits  GLUCOSE, CAPILLARY - Abnormal; Notable for the following:    Glucose-Capillary 120 (*)    All other components within normal limits  I-STAT CG4 LACTIC ACID, ED - Abnormal; Notable for the following:    Lactic Acid, Venous 3.75 (*)    All other components within normal limits  POCT I-STAT 3, ART BLOOD GAS (G3+) - Abnormal; Notable for the following:    pH, Arterial 7.152 (*)    pCO2 arterial 93.0 (*)    pO2, Arterial 45.0 (*)    Bicarbonate 32.5 (*)    All other components within normal limits  POCT I-STAT 3, ART BLOOD GAS (G3+) - Abnormal; Notable for the following:    pH, Arterial 7.301 (*)    pCO2 arterial 63.1 (*)    pO2, Arterial 61.0 (*)    Bicarbonate 31.2 (*)    Acid-Base Excess 3.0 (*)    All other components within normal limits  POCT I-STAT 3, ART BLOOD GAS (G3+) - Abnormal; Notable for the following:    pO2, Arterial 65.0 (*)    Bicarbonate 28.9 (*)    Acid-Base Excess 3.0 (*)    All other components within normal limits  MRSA PCR SCREENING  URINE CULTURE  MAGNESIUM  CORTISOL  PROCALCITONIN  PROTIME-INR  APTT  SODIUM, URINE, RANDOM  TSH  OSMOLALITY  BLOOD GAS, ARTERIAL  I-STAT TROPOININ, ED    Imaging Review Ct Head Wo Contrast  07/19/2014   CLINICAL DATA:  Found unresponsive. CPR. Continuous seizures. Concern for anoxic injury.  EXAM: CT HEAD WITHOUT CONTRAST  TECHNIQUE: Contiguous axial images were obtained from the base of the skull through the vertex without intravenous contrast.  COMPARISON:  None.  FINDINGS: No acute intracranial abnormality. Specifically, no hemorrhage, hydrocephalus, mass  lesion, acute infarction, or significant intracranial injury. No acute calvarial abnormality. Endotracheal tube is partially imaged. Mastoid air cells are clear.  IMPRESSION: No acute intracranial abnormality.   Electronically Signed   By: Rolm Baptise M.D.   On: 07/07/2014 13:46   Dg Chest Port 1 View  07/27/2014   CLINICAL DATA:  Central line placement, history hypertension, COPD, hyperlipidemia, vocal cord cancer post radiation therapy, smoker  EXAM: PORTABLE CHEST - 1 VIEW  COMPARISON:  Portable exam 1226 hours compared to 07/03/2014 at 1011 hours  FINDINGS: Tip of endotracheal tube projects 5.6 cm above carina.  LEFT sub central venous catheter tip projects over SVC near azygos confluence.  Nasogastric tube coiled in proximal stomach.  EKG leads and external pacing leads project over chest.  Enlargement of cardiac silhouette with slight vascular congestion.  Mild RIGHT basilar infiltrate with questionable LEFT perihilar infiltrate as well.  No pleural effusion or pneumothorax  IMPRESSION: No pneumothorax following LEFT subclavian line placement.  Otherwise no change.   Electronically Signed   By: Lavonia Dana M.D.   On: 07/02/2014 13:02   Dg Chest Port 1 View  07/20/2014   CLINICAL DATA:  59 year old male intubated status post CPR. Unresponsive. Initial encounter.  EXAM: PORTABLE CHEST - 1 VIEW  COMPARISON:  PET-CT 03/04/2014, and earlier  FINDINGS: Portable AP supine view at 1011 hrs. Resuscitation pads project over the chest. Endotracheal tube projects over the air column, tip just below the clavicles. Enteric tube courses to the abdomen, tip not included. Other EKG leads and wires overlie the chest. Stable cardiac and mediastinal contours allowing for portable technique. No pneumothorax, pulmonary edema or pleural effusion. There is mildly increased streaky perihilar opacity mostly on the right. No other confluent pulmonary opacity. No displaced rib fracture identified.  IMPRESSION: 1. ET tube in good  position.  Enteric tube courses to the abdomen. 2. Mild streaky perihilar opacity greater on the right. Top differential considerations include aspiration and infection.   Electronically Signed   By: Genevie Ann M.D.   On: 07/18/2014 10:38     EKG Interpretation None      MDM   Final diagnoses:  Unresponsive  Cardiopulmonary arrest   Pt is a 59 y.o. male with Pmhx as above who presents with to the ED s/p cardiac arrest. Patient was found to be pulseless and apneic in his bed at his nursing home at around 9:30.  He received 2 brief rounds of CPR in route by EMS including 3 doses of peripheral appendectomy intubation with 80 ET tube.  Rhythm was PEA.  Patient arrives intubated, but no spontaneous respirations.  No voluntary motor movement, clear lung fields.  Soft abdomen.  He is in sinus tachycardia with premature atrial complexes.  No significant ST changes.  His mildly hypotensive. IVF resuscitation begun. Patient is a ward of the state has a known history of laryngeal cancer status post chemoradiation, though had been doing well enough that he had his port removed in feb per medical record.  he is a full code per his  guardian representative Roxanne branch , who I reached through [place].  Her number is [phone].  She states she will be speaking with her supervisor.  I have spoken with critical care.  He will come to see the patient but do not feel that he is a candidate for hypothermia protocol   Patient is having rhythmic jerking motor movements with sharp nonsustained and not associated vital signs changes.  His are likely myoclonic jerking from brain anoxia. AT this point, cause of arrest unclear, No ST changes and first trop negative. Does not appear to have had a STEMI, No sings of pna, PTX on CXR.     Ernestina Patches, MD 07/07/14 1011

## 2014-07-06 NOTE — ED Notes (Signed)
Critical Care at bedside.  

## 2014-07-06 NOTE — Procedures (Signed)
ELECTROENCEPHALOGRAM REPORT   Patient: Andrew Osborn     Room #: 19M-09 Age: 59 y.o.        Sex: male Referring Physician: Dr Alva Garnet Report Date:  07/29/2014        Interpreting Physician: Hulen Luster  History: [name] is an 59 y.o. male found down for unclear amount of time in PEA arrest. 20 minutes of CPR prior to ROSC. Noted to have frequent myoclonic jerks. Not a candidate for hypothermia protocol.   Medications:  Continuous: . sodium chloride    . norepinephrine (LEVOPHED) Adult infusion      Conditions of Recording:  This is a 16 channel EEG carried out with the patient in the intubated and unresponsive state.  Description:  Interpretation is limited due to diffuse continuous muscle artifact. No posterior dominant rhythm is noted. No EEG reactivity to noxious stimuli is noted. The patient is noted to have frequent full body myoclonic jerks occuring every 15 to 45 seconds and lasting 1-3 seconds. Due to diffuse muscle artifact it is difficult to determine if these represent true electrographic seizure. The brief suppression of the background rhythm post event raises concern for possible epileptic etiology.   Normal sleep architecture is not observed. Hyperventilation and intermittent photic stimulation was not performed.   IMPRESSION: Abnormal EEG due to the presence of suppressed background rhythm and the presence of frequent myoclonic activity. Due to diffuse muscle artifact it is difficult to determine if the myoclonic episodes represent a true ictal state but based on findings cannot rule out possible myoclonic status. In the setting of anoxic injury this is a poor prognostic indicator.   Findings discussed with CCM PA, Marni Griffon and neurohospitalist, Dr Alexis Goodell. Will add loading dose of Depacon and continue 500mg  TID.   Jim Like, DO [facility] [phone]  If 7pm- 7am, please page neurology on call as listed in  [facility]. 07/05/2014, 4:12 PM

## 2014-07-06 NOTE — Clinical Social Work Note (Signed)
Clinical Social Work Assessment  Patient Details  Name: Andrew Osborn MRN: [id number] Date of Birth: 11/20/1955  Date of referral:  07/24/2014               Reason for consult:  Facility Placement                Permission sought to share information with:  Case Manager, Customer service manager, Guardian Permission granted to share information::  Yes, Verbal Permission Granted  Name::     Guardian: [facility]  and DSS patient is ward of the state  Agency::  [facility]: [phone]  Relationship::  ALF  [phone]  Contact Information:     Housing/Transportation Living arrangements for the past 2 months:  [place] of Information:  Medical Team, Facility, Guardian Patient Interpreter Needed:  None Criminal Activity/Legal Involvement Pertinent to Current Situation/Hospitalization:  No - Comment as needed Significant Relationships:  None Lives with:  Facility Resident Do you feel safe going back to the place where you live?  Yes Need for family participation in patient care:  No (Coment) (only guardian known at this time)  Care giving concerns:  Patient is a resident at ALF: [facility].  Recieves total care from facility.  Has been a long term resident of ALF. Patient has a guardian through [place] who helps make decisions for him. Unknown of family members at this time.   Social Worker assessment / plan:  Patient comes to ED unresponsive from ALF. Patient is active with ALF and LCSW called and confirmed he was a resident Patient also a ward of the state in which patient has a guardian. Number and contacts listed above. Patient being moved to 28M with unit CSW to follow.  Plan:  Return to ALF once medically stable and if physically able to manage in ALF.  May need higher level of care if patient decompensates while in hospital. Call Guardian for all needs/decision making abilities.  Employment status:  Disabled (Comment on whether or not  currently receiving Disability) Insurance information:  Medicare, Medicaid In [place] PT Recommendations:  Not assessed at this time Information / Referral to community resources:  Other (Comment Required) (none at this time)  Patient/Family's Response to care:  None present at this time   Patient/Family's Understanding of and Emotional Response to Diagnosis, Current Treatment, and Prognosis:  None present at this time.  Emotional Assessment Appearance:  Appears stated age Attitude/Demeanor/Rapport:  Unresponsive Affect (typically observed):  Withdrawn Orientation:   (unresponsive) Alcohol / Substance use:  Not Applicable Psych involvement (Current and /or in the community):  No (Comment)  Discharge Needs  Concerns to be addressed:  Cognitive Concerns, Decision making concerns Readmission within the last 30 days:  No Current discharge risk:  Chronically ill Barriers to Discharge:  Continued Medical Work up   Lilly Cove, LCSW 07/13/2014, 2:49 PM

## 2014-07-06 NOTE — Progress Notes (Signed)
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   eICU Interventions  Will add Versed 2 mg IV Q 1 hour PRN.      Intervention Category Major Interventions: Seizures - evaluation and management  Hazelene Doten Eugene 07/11/2014, 7:52 PM

## 2014-07-06 NOTE — ED Notes (Signed)
Critical care team at bedside placing central line.

## 2014-07-06 NOTE — ED Notes (Signed)
Critical  ISTAT result shown to Dr Tawnya Crook

## 2014-07-06 NOTE — ED Notes (Signed)
Per Dr. Tawnya Crook pt will not be a code cool. Iced saline stopped. Pt placed on levophed drip. Pt appears to be having seizure like activity. MD at aware and at bedside. Pt has no response to painful stimuli.

## 2014-07-06 NOTE — Progress Notes (Addendum)
LCSW was called by RN CM: Sarah  Patient is a ward of the state and this was confirmed by [facility]. [facility] reports patient is a current resident.  GuardianFredrich Birks [phone] [place].  Called Roxanne Brand unable to reach, will leave message. Called CM back with regards to information on patient found.  Lane Hacker, MSW Clinical Social Work: Emergency Room [phone]

## 2014-07-06 NOTE — ED Notes (Signed)
Pt given warm blankets.

## 2014-07-06 NOTE — Progress Notes (Signed)
RT Note: ABG results pH 7.15, pCO2 93, pO2 45, HCO3 45. MD paged, waiting on callback.

## 2014-07-06 NOTE — ED Notes (Signed)
MD aware that pt is continuing to have seizure like activity lasting 5-10 seconds at a time. MD at bedside

## 2014-07-06 NOTE — Progress Notes (Signed)
Transported pt from ED Trauma C to CT 3 then to 42M09 on ventilator. Pt stable throughout with no complications on ventilator. Pt seizing while in CT. Bedside report given to RT in 42M. RT will continue to monitor.

## 2014-07-06 NOTE — ED Notes (Addendum)
Per EMS- pt is from Customer service manager. Pt reported not feeling well to staff this morning and wanted to come to hospital. Pt was found at 0925 in his bed with absent pulses. Pt received a total of 3 epi and total of 20 minutes of CPR. Pt arrested 1 after initial CPR and ROSC. Pt arrived intubated with 8.0 tube at 26 at lip.

## 2014-07-06 NOTE — Progress Notes (Signed)
eLink Physician-Brief Progress Note Patient Name: Andrew Osborn DOB: 05-Feb-1955 MRN: [id number]   Date of Service  06/30/2014  HPI/Events of Note  ABG on 100%/PRVC 16/TV 500/P 5 = 7.152/93/45.0  eICU Interventions  Will increase PRVC rate to 20 and PEEP to 8. Recheck ABG at 6:45 PM.     Intervention Category Major Interventions: Respiratory failure - evaluation and management  Omarr Hann Cornelia Copa 07/10/2014, 5:39 PM

## 2014-07-06 NOTE — H&P (Signed)
PULMONARY / CRITICAL CARE MEDICINE   Name: Andrew Osborn MRN: [id number] DOB: [birth year]    ADMISSION DATE:  07/15/2014 CONSULTATION DATE: 07/21/2014  REFERRING MD :  Jinny Blossom Dochert  CHIEF COMPLAINT:  Cardiac Arrest  INITIAL PRESENTATION: Andrew Osborn is a 59 yo ward of the state who presents to the ED on 6/7 after an unknown down time s/p PEA arrest requiring approx. 45min ACLS prior to ROSC. He is unresponsive, exhibiting myoclonic jerking and decerebrate posturing.   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 6/6 he c/o "not feeling well" at the nursing home. He presents 6/7 after being found unresponsive in cardiopulmonary arrest at 0930 for an unknown length of time. He received cardiopulmonary resuscitation for reported PEA arrest for approx. 50min prior to ROSC. He was intubated in the field by EMS. PCCM was consulted to manage patient care. On arrival to trauma bay patient is unresponsive, exhibiting intermittent myoclonic jerks and decerebrate posturing. Subclavian line was placed as patient requires norepi for hemodynamic support. Patient is to be transferred to intensive care post head CT for further management.   PAST MEDICAL HISTORY :   has a past medical history of Obesity; Swallowing difficulty; Schizophrenia; Squamous cell carcinoma of left vocal cord (08/13/13); Hypertension; Hyperlipidemia; COPD (chronic obstructive pulmonary disease); Tobacco abuse (08/25/2013); Throat pain (09/17/2013); Constipation (09/17/2013); Thrush (10/28/2013); and S/P radiation therapy (Completed 11/09/13).  has past surgical history that  includes Direct laryngoscopy (N/A, 08/13/2013) and Inguinal hernia repair. Prior to Admission medications   Medication Sig Start Date End Date Taking? Authorizing Provider  acetaminophen (TYLENOL) 325 MG tablet Take 650 mg by mouth every 6 (six) hours as needed (pain).   Yes Historical Provider, MD  aspirin EC 81 MG tablet Take 81 mg by mouth every morning.    Yes Historical Provider, MD  buPROPion (WELLBUTRIN XL) 150 MG 24 hr tablet Take 150 mg by mouth every morning.   Yes Historical Provider, MD  cholecalciferol (VITAMIN D) 400 UNITS TABS tablet Take 800 Units by mouth daily.   Yes Historical Provider, MD  cycloSPORINE (RESTASIS) 0.05 % ophthalmic emulsion Place 1 drop into both eyes 2 (two) times daily.   Yes Historical Provider, MD  docusate sodium (COLACE) 100 MG capsule Take 1 capsule (100 mg total) by mouth 2 (two) times daily. Take once a day if stool gets too soft. 10/26/13  Yes Eppie Gibson, MD  guaiFENesin (ROBITUSSIN) 100 MG/5ML SOLN Take 5 mLs by mouth every 4 (four) hours as needed for cough or to loosen phlegm.   Yes Historical Provider, MD  OVER THE COUNTER MEDICATION Take 1 each by mouth 3 (three) times daily. Great Shakes   Yes Historical Provider, MD  polyethylene glycol (MIRALAX / GLYCOLAX) packet Take 17 g by mouth daily.   Yes Historical Provider, MD  promethazine (PHENERGAN) 25 MG tablet Take 25 mg by mouth every 6 (six) hours as needed for nausea or vomiting.   Yes Historical Provider, MD  risperiDONE (RISPERDAL) 2 MG tablet Take 2 mg by mouth 2 (two) times daily.   Yes Historical Provider, MD  simvastatin (ZOCOR) 10 MG tablet Take 10 mg by mouth  at bedtime.   Yes Historical Provider, MD  Skin Protectants, Misc. (HYDROCERIN EX) Apply 1 application topically 2 (two) times daily. Bilateral palm   Yes Historical Provider, MD   Allergies  Allergen Reactions  . Haldol [Haloperidol Lactate] Other (See Comments)    Unknown -- MAR states NKA, Haldol previously on file    FAMILY  HISTORY:  indicated that his mother is deceased. He indicated that his father is deceased.  SOCIAL HISTORY:  reports that he has been smoking Cigarettes.  He has a 10 pack-year smoking history. He has never used smokeless tobacco. He reports that he does not drink alcohol or use illicit drugs.  REVIEW OF SYSTEMS:  Unable to assess.   VITAL SIGNS: Temp:  [94 F (34.4 C)-94.1 F (34.5 C)] 94 F (34.4 C) (06/07 1446) Pulse Rate:  [64-110] 75 (06/07 1445) Resp:  [9-32] 16 (06/07 1445) BP: (89-131)/(51-79) 109/66 mmHg (06/07 1445) SpO2:  [85 %-100 %] 91 % (06/07 1445) FiO2 (%):  [60 %-100 %] 100 % (06/07 1446) Weight:  [60 kg (132 lb 4.4 oz)] 60 kg (132 lb 4.4 oz) (06/07 1008) HEMODYNAMICS:   VENTILATOR SETTINGS: Vent Mode:  [-] PRVC FiO2 (%):  [60 %-100 %] 100 % Set Rate:  [16 bmp] 16 bmp Vt Set:  [500 mL] 500 mL PEEP:  [5 cmH20] 5 cmH20 Plateau Pressure:  [16 cmH20-21 cmH20] 21 cmH20 INTAKE / OUTPUT:  Intake/Output Summary (Last 24 hours) at 07/19/2014 1457 Last data filed at 07/26/2014 1405  Gross per 24 hour  Intake   2585 ml  Output    100 ml  Net   2485 ml    PHYSICAL EXAMINATION: General: Chronically ill appearing male, with decerebrate posturing Neuro: Pupils are 52mm sluggish bilaterally, negative cough, gag, and corneal reflexes. Negative dolls. Posturing and myoclonic jerks. HEENT: Andrew Osborn/AT, ETT in place with bloody secretions coming from supraglottic suction Cardiovascular: slightly irregular rhythm without rubs, gallops, or murmurs.  Lungs: Course crackles in Right base and diminished in the bases bilaterally. Abdomen:  Soft, nondistended, with normoactive BS, liver border palpable Musculoskeletal:  Grossly intact Skin:  No rash, petechiae, or open wounds.   LABS:  CBC  Recent Labs Lab 07/20/2014 1105  WBC 12.9*  HGB 11.1*  HCT 32.6*  PLT 106*   Coag's  Recent Labs Lab 07/16/2014 1301  APTT 31  INR 1.14   BMET  Recent Labs Lab 07/23/2014 1105  07/27/2014 1301  NA 125* 125*  K 4.1 4.2  CL 91* 87*  CO2 27 29  BUN 12 12  CREATININE 0.80 0.81  GLUCOSE 189* 202*   Electrolytes  Recent Labs Lab 07/13/2014 1105 07/17/2014 1301  CALCIUM 7.7* 7.9*  MG 1.7  --   PHOS 5.2*  --    Sepsis Markers  Recent Labs Lab 07/02/2014 1119 07/29/2014 1301  LATICACIDVEN 3.75* 2.8*  PROCALCITON  --  <0.10   ABG No results for input(s): PHART, PCO2ART, PO2ART in the last 168 hours. Liver Enzymes No results for input(s): AST, ALT, ALKPHOS, BILITOT, ALBUMIN in the last 168 hours. Cardiac Enzymes  Recent Labs Lab 07/08/2014 1301  TROPONINI 0.08*   Glucose  Recent Labs Lab 07/09/2014 1433  GLUCAP 194*    Imaging Ct Head Wo Contrast  07/10/2014   CLINICAL DATA:  Found unresponsive. CPR. Continuous seizures. Concern for anoxic injury.  EXAM: CT HEAD WITHOUT CONTRAST  TECHNIQUE: Contiguous axial images were obtained from the base of the skull through the vertex without intravenous contrast.  COMPARISON:  None.  FINDINGS: No acute intracranial abnormality. Specifically, no hemorrhage, hydrocephalus, mass lesion, acute infarction, or significant intracranial injury. No acute calvarial abnormality. Endotracheal tube is partially imaged. Mastoid air cells are clear.  IMPRESSION: No acute intracranial abnormality.   Electronically Signed   By: Rolm Baptise M.D.   On: 07/17/2014 13:46   Dg Chest Port 1 View  07/23/2014   CLINICAL DATA:  Central line placement, history hypertension, COPD, hyperlipidemia, vocal cord cancer post radiation therapy, smoker  EXAM: PORTABLE CHEST - 1 VIEW  COMPARISON:  Portable exam 1226 hours compared to 07/01/2014 at 1011 hours  FINDINGS: Tip of endotracheal tube projects 5.6 cm above carina.  LEFT sub central venous catheter tip projects over SVC near azygos confluence.  Nasogastric tube coiled in proximal stomach.  EKG leads and external pacing leads project over chest.  Enlargement of cardiac silhouette with slight vascular  congestion.  Mild RIGHT basilar infiltrate with questionable LEFT perihilar infiltrate as well.  No pleural effusion or pneumothorax  IMPRESSION: No pneumothorax following LEFT subclavian line placement.  Otherwise no change.   Electronically Signed   By: Lavonia Dana M.D.   On: 07/04/2014 13:02   Dg Chest Port 1 View  07/10/2014   CLINICAL DATA:  59 year old male intubated status post CPR. Unresponsive. Initial encounter.  EXAM: PORTABLE CHEST - 1 VIEW  COMPARISON:  PET-CT 03/04/2014, and earlier  FINDINGS: Portable AP supine view at 1011 hrs. Resuscitation pads project over the chest. Endotracheal tube projects over the air column, tip just below the clavicles. Enteric tube courses to the abdomen, tip not included. Other EKG leads and wires overlie the chest. Stable cardiac and mediastinal contours allowing for portable technique. No pneumothorax, pulmonary edema or pleural effusion. There is mildly increased streaky perihilar opacity mostly on the right. No other confluent pulmonary opacity. No displaced rib fracture identified.  IMPRESSION: 1. ET tube in good position.  Enteric tube courses to the abdomen. 2. Mild streaky perihilar opacity greater on the right. Top differential considerations include aspiration and infection.   Electronically Signed   By: Genevie Ann M.D.   On: 07/20/2014 10:38     ASSESSMENT / PLAN:  PULMONARY OETT >>>  A:  Acute respiratory failure s/p cardiac arrest w/ inability to protect airway CXR clear P:   -fu am cxr -ABG now and in AM Full vent support -Wean FiO2 to maintain sats >92% -VAP protocol   CARDIOVASCULAR CVL: Left Subclavian 6/7>>> A:  PEA arrest Undifferentiated Shock/vasopressor dependent (Cardiogenic vs. Obstructive vs. Septic) H/o HTN P:  -Levophed to maintain MAP of 85 .  -45ml/kg bolus -CVP -IVF -12 lead EKG -Cycle cardiac enzymes - Echocardiogram stat - IF RV failure and high CVP could consider heparin, BUT given how extensive it seems his  neurological injury suspect that adding any additional treatment would be futile in nature.   RENAL A:   Hyponatremia Hyperphosphatemia   P:   -NS admin for resuscitation - urine osmo, cortisol and TSH sent -electrolyte as indicated  GASTROINTESTINAL A:  No acute process P:   -NPO -OG tube placement -Q4hr blood glucose check -SSI per protocol  HEMATOLOGIC A:   Slight anemia Thrombocytopenia P:  -CBC in AM and trend   INFECTIOUS A: No acute problem P:   -Trend fever curve  ENDOCRINE A:  Hyperglycemia P:   -Trend blood glucose per above -Will hold off on IV steroids for now  NEUROLOGIC A:   Anoxic injury Acute Encephalopathy P:   -  Head CT stat -Consider EEG  for negative head CT -IV valproate -Not a candidate for hypothermia protocol -Maintain normothermia  RASS goal: -2 PAD protocol   FAMILY  - Updates: Patient is a ward of the state. Ward is aware of patient transfer to [facility]. Will update post CT scan to ascertain prognosis.   - Inter-disciplinary family meet or Palliative Care meeting due by:  6/14    TODAY'S SUMMARY: Andrew Osborn is a 59 yo ward of the state with likely devastating injuries due to anoxia. He was found down after an undetermined amount of time, in PEA arrest. Approx 33min ACLS was performed prior to ROSC. Head CT scan neg for bleed. He is being transferred to intensive care for further care. We will get EEG cont supportive care. WIll need to reach out to his contact person from the state to determine goals of care.   Erick Colace ACNP-BC [facility] Pager # [phone] OR # [phone] if no answer    PCCM ATTENDING: I have reviewed pt's initial presentation, consultants notes and hospital database in detail.  The above assessment and plan was formulated under my direction.  In summary: 33 M NH resident, ward of state with schizophrenia h/o throat cancer treated with XRT and chemotherapy (portacath  removed 03/2014). He was found in NH unresponsive and pulseless. CPR initiated and initial rhythm PEA. Total duration of pulselessness is unknown. He was intubated in field and transported to [facility] ED where PCCM was asked to admit pt. Based on initial rhythm of PEA and unknown duration of down time, it was decided by me that he was not a candidate for hypothermia protocol. His initial exam revealed frequent and ballistic myoclonic jerks. He was fully comatose. His BP had stabilized on 15 mcg/min NE. His chest was without wheezes. His abdomen was nondistended. His extremities were warm without edema. I have reviewed all of today's lab results. Relevant abnormalities are discussed in the A/P section  IMPRESSION: Baseline poor health and functional status NH resident  Ward of state Schizophrenia H/O head and neck cancer - last PET 03/2014 demonstrated no definitive residual cancer PEA arrest - etiology unclear Prolonged cardiac arrest VDRF post arrest Cardiogenic shock post arrest Hyponatremia Mild anemia, chronic Mild thrombocytopenia Apparently very severe post anoxic encephalopathy  PLAN: Full vent support - settings reviewed and/or adjusted Vent bundle implemented R/O MI with serial markers NE to maintain MAP > 65 mmHg  Max dose of 20 mcg/min Monitor BMET intermittently Monitor I/Os Correct electrolytes as indicated  Pt is a ward of the state. Based on poor functional status prior to this event and on very poor prognosis for recovery back to even that level of function, I have made him DNR in event of recurrent arrest. We will assess his neuro status over the course of the next 24-48 hrs and make further recommendations re: continued support vs discontinuation of vent and comfort care. On 6/008, we will need to contact his guardian to inform of these events and discuss the poor prognosis   45 minutes of independent CCM time was provided by me   Merton Border, MD;  PCCM service; Mobile  [phone]     07/05/2014, 2:57 PM

## 2014-07-06 NOTE — Significant Event (Signed)
Wasted approximately 45cc of versed (bag from ED) into sink and flushed-witnessed this waste with another RN Karrie Meres.

## 2014-07-06 NOTE — Care Management Note (Addendum)
Case Management Note  Patient Details  Name: TRENDON ZARING MRN: [id number] Date of Birth: 11/09/1955  Subjective/Objective:     Patient is from a facility and is a ward of the state.  Need to get in contact with guardian.  SW referral placed and message left.      SW called back - Guardian is Roxanne Brand at [phone].  Refer to Dr. Tawnya Crook note from ER.  Still in incomplete form at this time.   Action/Plan:   Expected Discharge Date:                  Expected Discharge Plan:  [place]  In-House Referral:  Clinical Social Work  Discharge planning Services  CM Consult  Post Acute Care Choice:    Choice offered to:     DME Arranged:    DME Agency:     HH Arranged:    [place] Agency:     Status of Service:  In process, will continue to follow  Medicare Important Message Given:    Date Medicare IM Given:    Medicare IM give by:    Date Additional Medicare IM Given:    Additional Medicare Important Message give by:     If discussed at [place] of Stay Meetings, dates discussed:    Additional Comments:  Vergie Living, RN 07/10/2014, 2:22 PM

## 2014-07-06 NOTE — Progress Notes (Signed)
EEG completed, results pending. 

## 2014-07-06 NOTE — ED Notes (Signed)
Pt monitored by pulse ox, bp cuff, 12-lead, and Zoll pads.

## 2014-07-07 ENCOUNTER — Inpatient Hospital Stay (HOSPITAL_COMMUNITY): Payer: Medicare Other

## 2014-07-07 DIAGNOSIS — J9621 Acute and chronic respiratory failure with hypoxia: Secondary | ICD-10-CM

## 2014-07-07 DIAGNOSIS — G9340 Encephalopathy, unspecified: Secondary | ICD-10-CM

## 2014-07-07 DIAGNOSIS — Z452 Encounter for adjustment and management of vascular access device: Secondary | ICD-10-CM | POA: Insufficient documentation

## 2014-07-07 DIAGNOSIS — J9601 Acute respiratory failure with hypoxia: Secondary | ICD-10-CM

## 2014-07-07 LAB — BASIC METABOLIC PANEL
ANION GAP: 8 (ref 5–15)
Anion gap: 7 (ref 5–15)
BUN: 15 mg/dL (ref 6–20)
BUN: 14 mg/dL (ref 6–20)
CO2: 28 mmol/L (ref 22–32)
CO2: 30 mmol/L (ref 22–32)
CREATININE: 0.83 mg/dL (ref 0.61–1.24)
Calcium: 7.3 mg/dL — ABNORMAL LOW (ref 8.9–10.3)
Calcium: 7.8 mg/dL — ABNORMAL LOW (ref 8.9–10.3)
Chloride: 91 mmol/L — ABNORMAL LOW (ref 101–111)
Chloride: 91 mmol/L — ABNORMAL LOW (ref 101–111)
Creatinine, Ser: 0.88 mg/dL (ref 0.61–1.24)
GFR calc non Af Amer: >60 mL/min (ref >60)
GLUCOSE: 125 mg/dL — AB (ref 65–99)
Glucose, Bld: 114 mg/dL — ABNORMAL HIGH (ref 65–99)
Potassium: 3.9 mmol/L (ref 3.5–5.1)
Potassium: 4.3 mmol/L (ref 3.5–5.1)
SODIUM: 127 mmol/L — AB (ref 135–145)
Sodium: 128 mmol/L — ABNORMAL LOW (ref 135–145)

## 2014-07-07 LAB — POCT I-STAT 3, ART BLOOD GAS (G3+)
Acid-Base Excess: 3 mmol/L — ABNORMAL HIGH (ref 0.0–2.0)
Acid-Base Excess: 3 mmol/L — ABNORMAL HIGH (ref 0.0–2.0)
Bicarbonate: 31.2 mEq/L — ABNORMAL HIGH (ref 20.0–24.0)
Bicarbonate: 28.9 mEq/L — ABNORMAL HIGH (ref 20.0–24.0)
O2 Saturation: 87 %
O2 Saturation: 93 %
PH ART: 7.301 — AB (ref 7.350–7.450)
PH ART: 7.419 (ref 7.350–7.450)
TCO2: 33 mmol/L (ref 0–100)
TCO2: 30 mmol/L (ref 0–100)
pCO2 arterial: 63.1 mmHg (ref 35.0–45.0)
pCO2 arterial: 44.3 mmHg (ref 35.0–45.0)
pO2, Arterial: 61 mmHg — ABNORMAL LOW (ref 80.0–100.0)
pO2, Arterial: 65 mmHg — ABNORMAL LOW (ref 80.0–100.0)

## 2014-07-07 LAB — CBC
HEMATOCRIT: 33.5 % — AB (ref 39.0–52.0)
HEMOGLOBIN: 11.6 g/dL — AB (ref 13.0–17.0)
MCH: 31.4 pg (ref 26.0–34.0)
MCHC: 34.6 g/dL (ref 30.0–36.0)
MCV: 90.8 fL (ref 78.0–100.0)
PLATELETS: 109 10*3/uL — AB (ref 150–400)
RBC: 3.69 MIL/uL — AB (ref 4.22–5.81)
RDW: 14.2 % (ref 11.5–15.5)
WBC: 7.2 10*3/uL (ref 4.0–10.5)

## 2014-07-07 LAB — GLUCOSE, CAPILLARY
Glucose-Capillary: 115 mg/dL — ABNORMAL HIGH (ref 65–99)
Glucose-Capillary: 120 mg/dL — ABNORMAL HIGH (ref 65–99)
Glucose-Capillary: 121 mg/dL — ABNORMAL HIGH (ref 65–99)

## 2014-07-07 LAB — TROPONIN I
TROPONIN I: 0.29 ng/mL — AB (ref <0.031)
TROPONIN I: 0.25 ng/mL — AB (ref <0.031)

## 2014-07-07 LAB — URINE CULTURE
CULTURE: NO GROWTH
Colony Count: NO GROWTH

## 2014-07-07 LAB — MAGNESIUM: MAGNESIUM: 1.3 mg/dL — AB (ref 1.7–2.4)

## 2014-07-07 LAB — PHOSPHORUS: PHOSPHORUS: 1.9 mg/dL — AB (ref 2.5–4.6)

## 2014-07-07 MED ORDER — MIDAZOLAM BOLUS VIA INFUSION
1.0000 mg | INTRAVENOUS | Status: DC | PRN
  Filled 2014-07-07: qty 2

## 2014-07-07 MED ORDER — SODIUM PHOSPHATE 3 MMOLE/ML IV SOLN
30.0000 mmol | Freq: Once | INTRAVENOUS | Status: AC
  Administered 2014-07-07: 30 mmol via INTRAVENOUS
  Filled 2014-07-07: qty 10

## 2014-07-07 MED ORDER — MIDAZOLAM HCL 5 MG/ML IJ SOLN
0.0000 mg/h | INTRAMUSCULAR | Status: DC
  Administered 2014-07-07 – 2014-07-08 (×2): 2 mg/h via INTRAVENOUS
  Filled 2014-07-07 (×2): qty 10

## 2014-07-07 MED ORDER — ENOXAPARIN SODIUM 80 MG/0.8ML ~~LOC~~ SOLN
70.0000 mg | Freq: Two times a day (BID) | SUBCUTANEOUS | Status: DC
  Filled 2014-07-07 (×2): qty 0.8

## 2014-07-07 MED ORDER — SODIUM CHLORIDE 0.9 % IV SOLN: 2.0000 mg/h | INTRAVENOUS | Status: DC

## 2014-07-07 MED ORDER — FENTANYL CITRATE (PF) 100 MCG/2ML IJ SOLN
25.0000 ug | INTRAMUSCULAR | Status: DC | PRN
  Administered 2014-07-07: 100 ug via INTRAVENOUS
  Administered 2014-07-08 – 2014-07-09 (×3): 50 ug via INTRAVENOUS
  Administered 2014-07-11: 100 ug via INTRAVENOUS
  Filled 2014-07-07 (×5): qty 2

## 2014-07-07 MED ORDER — MAGNESIUM SULFATE 4 GM/100ML IV SOLN
4.0000 g | Freq: Once | INTRAVENOUS | Status: AC
  Administered 2014-07-07: 4 g via INTRAVENOUS
  Filled 2014-07-07: qty 100

## 2014-07-07 MED ORDER — MIDAZOLAM HCL 2 MG/2ML IJ SOLN
2.0000 mg | INTRAMUSCULAR | Status: DC | PRN
  Administered 2014-07-07 (×4): 2 mg via INTRAVENOUS
  Administered 2014-07-07 (×4): 4 mg via INTRAVENOUS
  Administered 2014-07-09 – 2014-07-11 (×6): 2 mg via INTRAVENOUS
  Administered 2014-07-11: 4 mg via INTRAVENOUS
  Administered 2014-07-12 – 2014-07-13 (×6): 2 mg via INTRAVENOUS
  Filled 2014-07-07 (×2): qty 2
  Filled 2014-07-07: qty 4
  Filled 2014-07-07 (×2): qty 2
  Filled 2014-07-07 (×2): qty 4
  Filled 2014-07-07 (×5): qty 2
  Filled 2014-07-07: qty 4
  Filled 2014-07-07 (×3): qty 2
  Filled 2014-07-07: qty 4
  Filled 2014-07-07 (×4): qty 2

## 2014-07-07 NOTE — Progress Notes (Signed)
[facility] Progress Note Patient Name: Andrew Osborn DOB: April 19, 1955 MRN: [id number]   Date of Service  07/07/2014  HPI/Events of Note  Hypophosphatemia and hypomag  eICU Interventions  Phos and Mag replaced     Intervention Category Intermediate Interventions: Electrolyte abnormality - evaluation and management  Nelsie Domino 07/07/2014, 5:11 AM

## 2014-07-07 NOTE — Progress Notes (Signed)
Bilateral lower extremity venous duplex completed:  No evidence of DVT, superficial thrombosis, or Baker's cyst.   

## 2014-07-07 NOTE — Progress Notes (Signed)
PULMONARY / CRITICAL CARE MEDICINE   Name: Andrew Osborn MRN: [id number] DOB: 02-25-1955    ADMISSION DATE:  07/17/2014 CONSULTATION DATE: 07/26/2014  REFERRING MD :  Jinny Blossom Dochert  CHIEF COMPLAINT:  Cardiac Arrest  INITIAL PRESENTATION: Andrew Osborn is a 59 yo ward of the state who presents to the ED on 6/7 after an unknown down time s/p PEA arrest requiring approx. 33min ACLS prior to ROSC. He was unresponsive, exhibiting myoclonic jerking and decerebrate posturing.   STUDIES:  6/7: Head CT- No acute intracranial process 6/7 EEg >> myoclonus, cannot r/o status  SIGNIFICANT EVENTS: 6/7: Admitted post PEA arrest, unknown downtime, 68min ACLS until ROSC, intubated in field    SUBJ - on levophed Low gr fever Unresponsive, myoclonic activity without stimulus  VITAL SIGNS: Temp:  [93.6 F (34.2 C)-101.6 F (38.7 C)] 100.1 F (37.8 C) (06/08 0800) Pulse Rate:  [64-110] 88 (06/08 0800) Resp:  [9-41] 26 (06/08 0800) BP: (83-131)/(51-79) 101/58 mmHg (06/08 0800) SpO2:  [85 %-100 %] 94 % (06/08 0800) FiO2 (%):  [60 %-100 %] 60 % (06/08 0725) Weight:  [132 lb 4.4 oz (60 kg)-159 lb 2.8 oz (72.2 kg)] 159 lb 2.8 oz (72.2 kg) (06/08 0355) HEMODYNAMICS:   VENTILATOR SETTINGS: Vent Mode:  [-] PRVC FiO2 (%):  [60 %-100 %] 60 % Set Rate:  [16 bmp-20 bmp] 20 bmp Vt Set:  [500 mL] 500 mL PEEP:  [5 cmH20-8 cmH20] 8 cmH20 Plateau Pressure:  [14 cmH20-21 cmH20] 20 cmH20 INTAKE / OUTPUT:  Intake/Output Summary (Last 24 hours) at 07/07/14 0856 Last data filed at 07/07/14 0800  Gross per 24 hour  Intake 5448.06 ml  Output    970 ml  Net 4478.06 ml    PHYSICAL EXAMINATION: General: Chronically ill appearing male, with decerebrate posturing Neuro: Pupils are 78mm sluggish bilaterally, negative cough, gag, and corneal reflexes. Negative dolls. Posturing and myoclonic jerks. HEENT: [place]/AT, ETT in place with bloody secretions  from supraglottic suction Cardiovascular: slightly  irregular rhythm without rubs, gallops, or murmurs.  Lungs: Course crackles in Right base and diminished in the bases bilaterally. Abdomen:  Soft, nondistended, with normoactive BS, liver border palpable Musculoskeletal:  Grossly intact Skin:  No rash, petechiae, or open wounds.   LABS:  CBC  Recent Labs Lab 07/13/2014 1105 07/07/14 0400  WBC 12.9* 7.2  HGB 11.1* 11.6*  HCT 32.6* 33.5*  PLT 106* 109*   Coag's  Recent Labs Lab 07/26/2014 1301  APTT 31  INR 1.14   BMET  Recent Labs Lab 07/25/2014 1301 07/04/2014 2355 07/07/14 0400  NA 125* 127* 128*  K 4.2 3.9 4.3  CL 87* 91* 91*  CO2 29 28 30   BUN 12 14 15   CREATININE 0.81 0.83 0.88  GLUCOSE 202* 125* 114*   Electrolytes  Recent Labs Lab 07/04/2014 1105 07/18/2014 1301 07/01/2014 2355 07/07/14 0400  CALCIUM 7.7* 7.9* 7.3* 7.8*  MG 1.7  --   --  1.3*  PHOS 5.2*  --   --  1.9*   Sepsis Markers  Recent Labs Lab 07/01/2014 1119 07/03/2014 1301  LATICACIDVEN 3.75* 2.8*  PROCALCITON  --  <0.10   ABG  Recent Labs Lab 07/09/2014 1554 07/18/2014 1843 07/07/14 0502  PHART 7.152* 7.301* 7.419  PCO2ART 93.0* 63.1* 44.3  PO2ART 45.0* 61.0* 65.0*   Liver Enzymes No results for input(s): AST, ALT, ALKPHOS, BILITOT, ALBUMIN in the last 168 hours. Cardiac Enzymes  Recent Labs Lab 07/28/2014 1758 07/25/2014 2355 07/07/14 0400  TROPONINI 0.20* 0.29*  0.25*   Glucose  Recent Labs Lab 07/25/2014 1433 07/03/2014 2002 07/07/14 0356 07/07/14 0808  GLUCAP 194* 105* 115* 120*    Imaging Ct Head Wo Contrast  07/05/2014   CLINICAL DATA:  Found unresponsive. CPR. Continuous seizures. Concern for anoxic injury.  EXAM: CT HEAD WITHOUT CONTRAST  TECHNIQUE: Contiguous axial images were obtained from the base of the skull through the vertex without intravenous contrast.  COMPARISON:  None.  FINDINGS: No acute intracranial abnormality. Specifically, no hemorrhage, hydrocephalus, mass lesion, acute infarction, or significant intracranial  injury. No acute calvarial abnormality. Endotracheal tube is partially imaged. Mastoid air cells are clear.  IMPRESSION: No acute intracranial abnormality.   Electronically Signed   By: Rolm Baptise M.D.   On: 07/12/2014 13:46   Dg Chest Port 1 View  07/08/2014   CLINICAL DATA:  Central line placement, history hypertension, COPD, hyperlipidemia, vocal cord cancer post radiation therapy, smoker  EXAM: PORTABLE CHEST - 1 VIEW  COMPARISON:  Portable exam 1226 hours compared to 07/05/2014 at 1011 hours  FINDINGS: Tip of endotracheal tube projects 5.6 cm above carina.  LEFT sub central venous catheter tip projects over SVC near azygos confluence.  Nasogastric tube coiled in proximal stomach.  EKG leads and external pacing leads project over chest.  Enlargement of cardiac silhouette with slight vascular congestion.  Mild RIGHT basilar infiltrate with questionable LEFT perihilar infiltrate as well.  No pleural effusion or pneumothorax  IMPRESSION: No pneumothorax following LEFT subclavian line placement.  Otherwise no change.   Electronically Signed   By: Lavonia Dana M.D.   On: 07/13/2014 13:02   Dg Chest Port 1 View  07/25/2014   CLINICAL DATA:  59 year old male intubated status post CPR. Unresponsive. Initial encounter.  EXAM: PORTABLE CHEST - 1 VIEW  COMPARISON:  PET-CT 03/04/2014, and earlier  FINDINGS: Portable AP supine view at 1011 hrs. Resuscitation pads project over the chest. Endotracheal tube projects over the air column, tip just below the clavicles. Enteric tube courses to the abdomen, tip not included. Other EKG leads and wires overlie the chest. Stable cardiac and mediastinal contours allowing for portable technique. No pneumothorax, pulmonary edema or pleural effusion. There is mildly increased streaky perihilar opacity mostly on the right. No other confluent pulmonary opacity. No displaced rib fracture identified.  IMPRESSION: 1. ET tube in good position.  Enteric tube courses to the abdomen. 2. Mild  streaky perihilar opacity greater on the right. Top differential considerations include aspiration and infection.   Electronically Signed   By: Genevie Ann M.D.   On: 07/26/2014 10:38     ASSESSMENT / PLAN:  PULMONARY OETT >>>  A:  Acute respiratory failure s/p cardiac arrest w/ inability to protect airway Unclear cause of hypoxia - ?PE  P:   Full vent support -Wean FiO2 to maintain sats >92% -VAP protocol -doppler legs -  therapeutic lovenox if positive   CARDIOVASCULAR CVL: Left Subclavian 6/7>>> A:  PEA arrest Undifferentiated Shock/vasopressor dependent (Cardiogenic vs. Obstructive vs. Septic) H/o HTN P:  -Levophed to maintain MAP of 85 .  - Echocardiogram stat -CVp to guide fluids   RENAL A:   Hyponatremia Hyperphosphatemia  hypomag  P:   -NS admin for resuscitation -replete electrolyte as indicated  GASTROINTESTINAL A:  No acute process P:   -NPO -OG tube placement   HEMATOLOGIC A:   Slight anemia Thrombocytopenia P:  -CBC in AM and trend   INFECTIOUS A: No acute problem P:   -Trend fever curve  ENDOCRINE  A:  Hyperglycemia P:   -Trend blood glucose per above   NEUROLOGIC A:   Acute anoxic Encephalopathy Myoclonus P:   -IV valproate -Not a candidate for hypothermia protocol -Maintain normothermia  RASS goal: 0 PAD protocol   FAMILY  - Updates: Patient is a ward of the state. Spoke to Ms branch POA-  She will try to intimate family, if any  - Inter-disciplinary family meet or Palliative Care meeting due by:  6/14    TODAY'S SUMMARY:  59 yo ward of the state with likely devastating injuries due to anoxia. He was found down after an undetermined amount of time, in PEA arrest. Approx 12min ACLS was performed prior to ROSC. Myoclonus indicates extensive anoxic damage - will plan for withdrawal of life support once POA agrees & give family a chance to visit  The patient is critically ill with multiple organ systems failure and  requires high complexity decision making for assessment and support, frequent evaluation and titration of therapies, application of advanced monitoring technologies and extensive interpretation of multiple databases. Critical Care Time devoted to patient care services described in this note independent of APP time is 35 minutes.      Kara Mead MD. Shade Flood. [facility] Pager [phone] If no response call [phone]     07/07/2014, 8:56 AM

## 2014-07-08 LAB — CBC
HCT: 32.1 % — ABNORMAL LOW (ref 39.0–52.0)
Hemoglobin: 11 g/dL — ABNORMAL LOW (ref 13.0–17.0)
MCH: 31.4 pg (ref 26.0–34.0)
MCHC: 34.3 g/dL (ref 30.0–36.0)
MCV: 91.7 fL (ref 78.0–100.0)
Platelets: 104 10*3/uL — ABNORMAL LOW (ref 150–400)
RBC: 3.5 MIL/uL — ABNORMAL LOW (ref 4.22–5.81)
RDW: 14.9 % (ref 11.5–15.5)
WBC: 9.7 10*3/uL (ref 4.0–10.5)

## 2014-07-08 LAB — BASIC METABOLIC PANEL
Anion gap: 4 — ABNORMAL LOW (ref 5–15)
BUN: 18 mg/dL (ref 6–20)
CALCIUM: 7.7 mg/dL — AB (ref 8.9–10.3)
CHLORIDE: 96 mmol/L — AB (ref 101–111)
CO2: 32 mmol/L (ref 22–32)
Creatinine, Ser: 0.78 mg/dL (ref 0.61–1.24)
GFR calc non Af Amer: >60 mL/min (ref >60)
Glucose, Bld: 116 mg/dL — ABNORMAL HIGH (ref 65–99)
POTASSIUM: 3.9 mmol/L (ref 3.5–5.1)
SODIUM: 132 mmol/L — AB (ref 135–145)

## 2014-07-08 LAB — GLUCOSE, CAPILLARY
GLUCOSE-CAPILLARY: 123 mg/dL — AB (ref 65–99)
Glucose-Capillary: 114 mg/dL — ABNORMAL HIGH (ref 65–99)
Glucose-Capillary: 105 mg/dL — ABNORMAL HIGH (ref 65–99)
Glucose-Capillary: 94 mg/dL (ref 65–99)
Glucose-Capillary: 93 mg/dL (ref 65–99)

## 2014-07-08 LAB — PHOSPHORUS: PHOSPHORUS: 2.2 mg/dL — AB (ref 2.5–4.6)

## 2014-07-08 LAB — MAGNESIUM: MAGNESIUM: 1.9 mg/dL (ref 1.7–2.4)

## 2014-07-08 MED ORDER — CETYLPYRIDINIUM CHLORIDE 0.05 % MT LIQD
7.0000 mL | Freq: Four times a day (QID) | OROMUCOSAL | Status: DC
  Administered 2014-07-08 – 2014-07-14 (×24): 7 mL via OROMUCOSAL

## 2014-07-08 MED ORDER — PANTOPRAZOLE SODIUM 40 MG PO PACK
40.0000 mg | PACK | Freq: Every day | ORAL | Status: DC
  Administered 2014-07-08 – 2014-07-13 (×6): 40 mg
  Filled 2014-07-08 (×7): qty 20

## 2014-07-08 MED ORDER — CHLORHEXIDINE GLUCONATE 0.12 % MT SOLN
15.0000 mL | Freq: Two times a day (BID) | OROMUCOSAL | Status: DC
  Administered 2014-07-08 – 2014-07-14 (×13): 15 mL via OROMUCOSAL
  Filled 2014-07-08 (×12): qty 15

## 2014-07-08 NOTE — Progress Notes (Signed)
Nutrition Brief Note  Chart reviewed. Discussed patient in ICU rounds today. Plans for withdrawal of life support. No nutrition intervention indicated at this time. Please consult nutrition if needed.  Molli Barrows, RD, LDN, [place] Pager [phone] After Hours Pager [phone]

## 2014-07-08 NOTE — Clinical Social Work Note (Addendum)
Clinical Social Worker has contacted [facility], [facility] in regards to locating family and facility staff reported in the 25 years they have never seen pt's family. Staff reported pt does have a brother however they have been unsuccessful locating pt's brother. CSW has also attempted to contact pt's legal guardian, Fredrich Birks and left a message for a returned phone call. RNCM notified.   CSW remains available as needed.   Glendon Axe, MSW, LCSWA [phone] 07/08/2014 10:26 AM

## 2014-07-08 NOTE — Progress Notes (Signed)
PULMONARY / CRITICAL CARE MEDICINE   Name: Andrew Osborn MRN: [id number] DOB: 1955/10/16    ADMISSION DATE:  07/29/2014 CONSULTATION DATE: 07/05/2014  REFERRING MD :  Jinny Blossom Dochert  CHIEF COMPLAINT:  Cardiac Arrest  INITIAL PRESENTATION: Andrew Osborn is a 59 yo ward of the state who presents to the ED on 6/7 after an unknown down time s/p PEA arrest requiring approx. 63min ACLS prior to ROSC. He was unresponsive, exhibiting myoclonic jerking and decerebrate posturing.   STUDIES:  6/7: Head CT- No acute intracranial process 6/7 EEg >> myoclonus, cannot r/o status  SIGNIFICANT EVENTS: 6/7: Admitted post PEA arrest, unknown downtime, 39min ACLS until ROSC, intubated in field 6/8 versed gtt for myoclonus    SUBJ - on lower dose levophed afebrile Unresponsive on versed gtt -no myoclonic activity   VITAL SIGNS: Temp:  [99 F (37.2 C)-100.9 F (38.3 C)] 99.3 F (37.4 C) (06/09 0700) Pulse Rate:  [35-105] 94 (06/09 1119) Resp:  [17-28] 26 (06/09 1119) BP: (91-116)/(44-69) 113/55 mmHg (06/09 1119) SpO2:  [86 %-100 %] 100 % (06/09 1119) FiO2 (%):  [70 %-100 %] 70 % (06/09 1120) Weight:  [165 lb 2 oz (74.9 kg)] 165 lb 2 oz (74.9 kg) (06/09 0500) HEMODYNAMICS:   VENTILATOR SETTINGS: Vent Mode:  [-] PRVC FiO2 (%):  [70 %-100 %] 70 % Set Rate:  [16 bmp-20 bmp] 20 bmp Vt Set:  [500 mL] 500 mL PEEP:  [8 cmH20] 8 cmH20 Plateau Pressure:  [15 cmH20-19 cmH20] 16 cmH20 INTAKE / OUTPUT:  Intake/Output Summary (Last 24 hours) at 07/08/14 1306 Last data filed at 07/08/14 1200  Gross per 24 hour  Intake 3414.3 ml  Output   1690 ml  Net 1724.3 ml    PHYSICAL EXAMINATION: General: Chronically ill appearing male, with decerebrate posturing Neuro: Pupils are 56mm sluggish bilaterally, negative cough, gag, and corneal reflexes. Negative dolls. Posturing , no myoclonic jerks on versed gtt. HEENT: [place]/AT, ETT  Cardiovascular: slightly irregular rhythm without rubs, gallops, or  murmurs.  Lungs: Course crackles in Right base and diminished in the bases bilaterally. Abdomen:  Soft, nondistended, with normoactive BS, liver border palpable Musculoskeletal:  Grossly intact Skin:  No rash, petechiae, or open wounds.   LABS:  CBC  Recent Labs Lab 07/20/2014 1105 07/07/14 0400 07/08/14 0529  WBC 12.9* 7.2 9.7  HGB 11.1* 11.6* 11.0*  HCT 32.6* 33.5* 32.1*  PLT 106* 109* 104*   Coag's  Recent Labs Lab 07/08/2014 1301  APTT 31  INR 1.14   BMET  Recent Labs Lab 07/02/2014 2355 07/07/14 0400 07/08/14 0529  NA 127* 128* 132*  K 3.9 4.3 3.9  CL 91* 91* 96*  CO2 28 30 32  BUN 14 15 18   CREATININE 0.83 0.88 0.78  GLUCOSE 125* 114* 116*   Electrolytes  Recent Labs Lab 07/01/2014 1105  07/07/2014 2355 07/07/14 0400 07/08/14 0529  CALCIUM 7.7*  < > 7.3* 7.8* 7.7*  MG 1.7  --   --  1.3* 1.9  PHOS 5.2*  --   --  1.9* 2.2*  < > = values in this interval not displayed. Sepsis Markers  Recent Labs Lab 06/30/2014 1119 07/10/2014 1301  LATICACIDVEN 3.75* 2.8*  PROCALCITON  --  <0.10   ABG  Recent Labs Lab 07/27/2014 1554 07/19/2014 1843 07/07/14 0502  PHART 7.152* 7.301* 7.419  PCO2ART 93.0* 63.1* 44.3  PO2ART 45.0* 61.0* 65.0*   Liver Enzymes No results for input(s): AST, ALT, ALKPHOS, BILITOT, ALBUMIN in the last 168  hours. Cardiac Enzymes  Recent Labs Lab 07/08/2014 1758 07/05/2014 2355 07/07/14 0400  TROPONINI 0.20* 0.29* 0.25*   Glucose  Recent Labs Lab 07/07/14 0808 07/07/14 1603 07/07/14 2351 07/08/14 0351 07/08/14 0813 07/08/14 1124  GLUCAP 120* 121* 123* 114* 105* 94    Imaging No results found.   ASSESSMENT / PLAN:  PULMONARY OETT  6/7>>>  A:  Acute respiratory failure s/p cardiac arrest w/ inability to protect airway Unclear cause of hypoxia - ?PE  P:   Full vent support -Wean FiO2 to maintain sats >92% -VAP protocol -Hold off  therapeutic lovenox given overall prognosis   CARDIOVASCULAR CVL: Left Subclavian  6/7>>> A:  PEA arrest Undifferentiated Shock/vasopressor dependent (Cardiogenic vs. Obstructive vs. Septic) H/o HTN P:  -Levophed to maintain MAP of 85 .  - Echocardiogram    RENAL A:   Hyponatremia Hyperphosphatemia  hypomag  P:   -NS admin for resuscitation -replete electrolyte as indicated  GASTROINTESTINAL A:  No acute process P:   -NPO -hold off TFs   HEMATOLOGIC A:   Slight anemia Thrombocytopenia P:  -CBC in AM and trend   INFECTIOUS A: No acute problem P:   -Trend fever curve  ENDOCRINE A:  Hyperglycemia P:   -Trend blood glucose per above   NEUROLOGIC A:   Acute anoxic Encephalopathy Myoclonus P:   -IV valproate -Not a candidate for hypothermia protocol -Maintain normothermia  RASS goal: 0 PAD protocol   FAMILY  - Updates: Patient is a ward of the state. Spoke to Ms Marca Ancona , state guardian -  She will try to intimate family, no family has come yet  - Inter-disciplinary family meet or Palliative Care meeting due by:  6/14    TODAY'S SUMMARY:  59 yo ward of the state with likely devastating injuries due to anoxia. He was found down after an undetermined amount of time, in PEA arrest. Approx 40min ACLS was performed prior to ROSC. Myoclonus indicates extensive anoxic damage - will plan for withdrawal of life support once POA agrees & give family a chance to visit  The patient is critically ill with multiple organ systems failure and requires high complexity decision making for assessment and support, frequent evaluation and titration of therapies, application of advanced monitoring technologies and extensive interpretation of multiple databases. Critical Care Time devoted to patient care services described in this note independent of APP time is 35 minutes.      Kara Mead MD. Shade Flood. [place] Pager [phone] If no response call [phone]     07/08/2014, 1:06 PM

## 2014-07-08 NOTE — Progress Notes (Signed)
Pt previously on Versed drip 1 mg/ml; 35 ml of 50 ml bag wasted in sink; witnessed by Seymour Bars, RN  Lenor Coffin, RN

## 2014-07-09 LAB — BASIC METABOLIC PANEL
ANION GAP: 8 (ref 5–15)
BUN: 15 mg/dL (ref 6–20)
CALCIUM: 8.4 mg/dL — AB (ref 8.9–10.3)
CO2: 31 mmol/L (ref 22–32)
Chloride: 99 mmol/L — ABNORMAL LOW (ref 101–111)
Creatinine, Ser: 0.78 mg/dL (ref 0.61–1.24)
GFR calc Af Amer: >60 mL/min (ref >60)
Glucose, Bld: 97 mg/dL (ref 65–99)
Potassium: 3.9 mmol/L (ref 3.5–5.1)
Sodium: 138 mmol/L (ref 135–145)

## 2014-07-09 LAB — GLUCOSE, CAPILLARY
GLUCOSE-CAPILLARY: 90 mg/dL (ref 65–99)
GLUCOSE-CAPILLARY: 116 mg/dL — AB (ref 65–99)
Glucose-Capillary: 131 mg/dL — ABNORMAL HIGH (ref 65–99)

## 2014-07-09 MED ORDER — VITAL HIGH PROTEIN PO LIQD
1000.0000 mL | ORAL | Status: DC
  Filled 2014-07-09 (×2): qty 1000

## 2014-07-09 MED ORDER — VITAL 1.5 CAL PO LIQD
1000.0000 mL | ORAL | Status: DC
  Administered 2014-07-09 – 2014-07-13 (×6): 1000 mL
  Filled 2014-07-09 (×9): qty 1000

## 2014-07-09 MED ORDER — ENOXAPARIN SODIUM 40 MG/0.4ML ~~LOC~~ SOLN
40.0000 mg | SUBCUTANEOUS | Status: DC
  Administered 2014-07-09 – 2014-07-11 (×3): 40 mg via SUBCUTANEOUS
  Filled 2014-07-09 (×4): qty 0.4

## 2014-07-09 MED ORDER — DEXTROSE-NACL 5-0.9 % IV SOLN
INTRAVENOUS | Status: DC
  Administered 2014-07-09 – 2014-07-12 (×2): via INTRAVENOUS

## 2014-07-09 NOTE — Progress Notes (Signed)
PULMONARY / CRITICAL CARE MEDICINE   Name: NEKHI LIWANAG MRN: [id number] DOB: 23-Jan-1956    ADMISSION DATE:  07/26/2014 CONSULTATION DATE: 07/09/2014  REFERRING MD :  Jinny Blossom Dochert  CHIEF COMPLAINT:  Cardiac Arrest  INITIAL PRESENTATION: Andrew Osborn is a 59 yo ward of the state who presents to the ED on 6/7 after an unknown down time s/p PEA arrest requiring approx. 39min ACLS prior to ROSC. He was unresponsive, exhibiting myoclonic jerking and decerebrate posturing.   STUDIES:  6/7: Head CT- No acute intracranial process 6/7 EEg >> myoclonus, cannot r/o status  SIGNIFICANT EVENTS: 6/7: Admitted post PEA arrest, unknown downtime, 26min ACLS until ROSC, intubated in field 6/8 versed gtt for myoclonus    SUBJ - off levophed afebrile Unresponsive ,off versed gtt -no myoclonic activity   VITAL SIGNS: Temp:  [99.8 F (37.7 C)-100.6 F (38.1 C)] 100.1 F (37.8 C) (06/10 0900) Pulse Rate:  [34-106] 97 (06/10 0900) Resp:  [20-32] 24 (06/10 0900) BP: (91-137)/(41-81) 131/81 mmHg (06/10 0900) SpO2:  [91 %-100 %] 94 % (06/10 0900) FiO2 (%):  [50 %-70 %] 50 % (06/10 0817) Weight:  [160 lb 15 oz (73 kg)] 160 lb 15 oz (73 kg) (06/10 0500) HEMODYNAMICS:   VENTILATOR SETTINGS: Vent Mode:  [-] PRVC FiO2 (%):  [50 %-70 %] 50 % Set Rate:  [20 bmp] 20 bmp Vt Set:  [500 mL] 500 mL PEEP:  [8 cmH20] 8 cmH20 Plateau Pressure:  [13 cmH20-16 cmH20] 13 cmH20 INTAKE / OUTPUT:  Intake/Output Summary (Last 24 hours) at 07/09/14 1053 Last data filed at 07/09/14 0900  Gross per 24 hour  Intake 2538.4 ml  Output   2155 ml  Net  383.4 ml    PHYSICAL EXAMINATION: General: Chronically ill appearing male, with decerebrate posturing Neuro: saccadic eye movements,Pupils are 53mm sluggish bilaterally, negative cough, gag, and corneal reflexes. Negative dolls. Posturing , no myoclonic jerks  HEENT: [place]/AT, ETT  Cardiovascular: slightly irregular rhythm without rubs, gallops, or murmurs.   Lungs: Course crackles in Right base and diminished in the bases bilaterally. Abdomen:  Soft, nondistended, with normoactive BS, liver border palpable Musculoskeletal:  Grossly intact Skin:  No rash, petechiae, or open wounds.   LABS:  CBC  Recent Labs Lab 07/20/2014 1105 07/07/14 0400 07/08/14 0529  WBC 12.9* 7.2 9.7  HGB 11.1* 11.6* 11.0*  HCT 32.6* 33.5* 32.1*  PLT 106* 109* 104*   Coag's  Recent Labs Lab 07/24/2014 1301  APTT 31  INR 1.14   BMET  Recent Labs Lab 07/07/14 0400 07/08/14 0529 07/09/14 0340  NA 128* 132* 138  K 4.3 3.9 3.9  CL 91* 96* 99*  CO2 30 32 31  BUN 15 18 15   CREATININE 0.88 0.78 0.78  GLUCOSE 114* 116* 97   Electrolytes  Recent Labs Lab 07/24/2014 1105  07/07/14 0400 07/08/14 0529 07/09/14 0340  CALCIUM 7.7*  < > 7.8* 7.7* 8.4*  MG 1.7  --  1.3* 1.9  --   PHOS 5.2*  --  1.9* 2.2*  --   < > = values in this interval not displayed. Sepsis Markers  Recent Labs Lab 07/21/2014 1119 07/28/2014 1301  LATICACIDVEN 3.75* 2.8*  PROCALCITON  --  <0.10   ABG  Recent Labs Lab 07/20/2014 1554 07/19/2014 1843 07/07/14 0502  PHART 7.152* 7.301* 7.419  PCO2ART 93.0* 63.1* 44.3  PO2ART 45.0* 61.0* 65.0*   Liver Enzymes No results for input(s): AST, ALT, ALKPHOS, BILITOT, ALBUMIN in the last 168 hours. Cardiac  Enzymes  Recent Labs Lab 07/02/2014 1758 07/15/2014 2355 07/07/14 0400  TROPONINI 0.20* 0.29* 0.25*   Glucose  Recent Labs Lab 07/07/14 2351 07/08/14 0351 07/08/14 0813 07/08/14 1124 07/08/14 1610 07/09/14 0737  GLUCAP 123* 114* 105* 94 93 90    Imaging No results found.   ASSESSMENT / PLAN:  PULMONARY OETT  6/7>>>  A:  Acute respiratory failure s/p cardiac arrest w/ inability to protect airway Unclear cause of hypoxia - ?PE  P:   -Wean FiO2/PEEP  to maintain sats >92% -VAP protocol -Hold off  therapeutic lovenox given overall prognosis   CARDIOVASCULAR CVL: Left Subclavian 6/7>>> A:  PEA  arrest Undifferentiated Shock/vasopressor dependent (Cardiogenic vs. Obstructive vs. Septic) H/o HTN P:  -Off Levophed   RENAL A:   Hyponatremia Hyperphosphatemia  hypomag  P:   -D5NS  -replete electrolyte as indicated  GASTROINTESTINAL A:  No acute process P:   -NPO -hold off TFs   HEMATOLOGIC A:   Slight anemia Thrombocytopenia P:   -monitor int    INFECTIOUS A: No acute problem P:   -Trend fever curve  ENDOCRINE A:  Hyperglycemia P:   -Trend blood glucose per above   NEUROLOGIC A:   Acute anoxic Encephalopathy-Not a candidate for hypothermia protocol Myoclonus P:   -IV valproate, versed prn myoclonus -Maintain normothermia  RASS goal: 0 -fent prn   FAMILY  - Updates: Patient is a ward of the state. Spoke to  state guardian -  She will try to intimate family, no family has come yet  - Inter-disciplinary family meet or Palliative Care meeting due by:  6/14    TODAY'S SUMMARY:  59 yo ward of the state with likely devastating injuries due to anoxia. He was found down after an undetermined amount of time, in PEA arrest. Approx 29min ACLS was performed prior to ROSC. Myoclonus indicates extensive anoxic damage - will plan for withdrawal of life support once state guardian agrees & give family a chance to visit -filled out paperwork for withdrawal process   The patient is critically ill with multiple organ systems failure and requires high complexity decision making for assessment and support, frequent evaluation and titration of therapies, application of advanced monitoring technologies and extensive interpretation of multiple databases. Critical Care Time devoted to patient care services described in this note independent of APP time is 31 minutes.      Kara Mead MD. Shade Flood. [place] [facility] Pager [phone] If no response call [phone]     07/09/2014, 10:53 AM

## 2014-07-09 NOTE — Progress Notes (Signed)
Initial Nutrition Assessment  DOCUMENTATION CODES:  Not applicable  INTERVENTION:  Initiate TF via OGT with Vital 1.5 at 25 ml/h and Prostat 30 ml BID on day 1; on day 2, increase to goal rate of 60 ml/h (1440 ml per day) to provide 2160 kcals, 97 gm protein, 1100 ml free water daily.  NUTRITION DIAGNOSIS:  Inadequate oral intake related to inability to eat as evidenced by NPO status.  GOAL:  Patient will meet greater than or equal to 90% of their needs  MONITOR:  Vent status, Skin, TF tolerance, Weight trends, Labs, I & O's  REASON FOR ASSESSMENT:  Consult Enteral/tube feeding initiation and management  ASSESSMENT: Thaddeus Evitts is a 59 yo ward of the state who presents to the ED on 6/7 after an unknown down time s/p PEA arrest requiring approx. 64min ACLS prior to ROSC. He was unresponsive, exhibiting myoclonic jerking and decerebrate posturing.   RD received consult for TF initiation and management. Case discussed with RN, who reports eventual plan is withdrawal of care, however, this is being delayed at present due to inability to contact family and paperwork for withdrawal process.   Patient is currently intubated on ventilator support. OGT in place. MV: 12.8 L/min Temp (24hrs), Avg:100.1 F (37.8 C), Min:99.8 F (37.7 C), Max:100.6 F (38.1 C)  Wt hx reveals wt stability. UBW 155#.   Nutrition-Focused physical exam completed. Findings are mild fat depletion, mild muscle depletion, and moderate edema.   Height:  Ht Readings from Last 1 Encounters:  03/05/14 5\' 10"  (1.778 m)    Weight:  Wt Readings from Last 1 Encounters:  07/09/14 160 lb 15 oz (73 kg)    Ideal Body Weight:  75.5 kg  Wt Readings from Last 10 Encounters:  07/09/14 160 lb 15 oz (73 kg)  03/05/14 150 lb 4.8 oz (68.176 kg)  12/15/13 157 lb 1.6 oz (71.26 kg)  12/07/13 152 lb (68.947 kg)  11/23/13 155 lb 11.2 oz (70.625 kg)  11/09/13 154 lb 12.8 oz (70.217 kg)  11/06/13 151 lb 9.6 oz  (68.765 kg)  11/02/13 155 lb 8 oz (70.534 kg)  10/28/13 154 lb 1.6 oz (69.899 kg)  10/26/13 153 lb 1.6 oz (69.446 kg)    BMI:  Body mass index is 23.09 kg/(m^2). Normal weight range  Estimated Nutritional Needs:  Kcal:  2065.2  Protein:  95-105  Fluid:  > 2.0 L  Skin:  Wound (see comment) (DTI on buttocks)  Diet Order:   NPO  EDUCATION NEEDS:  No education needs identified at this time   Intake/Output Summary (Last 24 hours) at 07/09/14 1153 Last data filed at 07/09/14 0900  Gross per 24 hour  Intake 2288.4 ml  Output   2155 ml  Net  133.4 ml    Last BM:  PTA  Amberley Hamler A. Jimmye Norman, RD, LDN, CDE Pager: [phone] After hours Pager: [phone]

## 2014-07-10 LAB — GLUCOSE, CAPILLARY
GLUCOSE-CAPILLARY: 153 mg/dL — AB (ref 65–99)
GLUCOSE-CAPILLARY: 161 mg/dL — AB (ref 65–99)
GLUCOSE-CAPILLARY: 162 mg/dL — AB (ref 65–99)
Glucose-Capillary: 133 mg/dL — ABNORMAL HIGH (ref 65–99)
Glucose-Capillary: 137 mg/dL — ABNORMAL HIGH (ref 65–99)
Glucose-Capillary: 145 mg/dL — ABNORMAL HIGH (ref 65–99)

## 2014-07-10 NOTE — Progress Notes (Signed)
RT notified by RN that patient desat into the 80's. Paradoxical breathing observed and absent breath sounds auscultated on the left side. RT attempted recruitment maneuver and patient respiratory rate increased into the 30's and O2 sats decreased. RT increased the FiO2 to 70% with O2 Sats of 87%; notified MD and she stated due to patient DNR status that will hold off on any other interventions at this time. RT will continue to monitor.

## 2014-07-10 NOTE — Progress Notes (Signed)
PULMONARY / CRITICAL CARE MEDICINE   Name: Andrew Osborn MRN: [id number] DOB: [birth year]    ADMISSION DATE:  07/17/2014 CONSULTATION DATE: 07/26/2014  REFERRING MD :  Jinny Blossom Dochert  CHIEF COMPLAINT:  Cardiac Arrest  INITIAL PRESENTATION: Andrew Osborn is a 59 yo ward of the state who presents to the ED on 6/7 after an unknown down time s/p PEA arrest requiring approx. 96min ACLS prior to ROSC. He was unresponsive, exhibiting myoclonic jerking and decerebrate posturing.   STUDIES:  6/7: Head CT- No acute intracranial process 6/7 EEg >> myoclonus, cannot r/o status  SIGNIFICANT EVENTS: 6/7: Admitted post PEA arrest, unknown downtime, 16min ACLS until ROSC, intubated in field 6/8 versed gtt for myoclonus 6/10 withdrawal of life support paperwork filled out.    SUBJ - off levophed afebrile Unresponsive ,off versed gtt -no myoclonic activity   VITAL SIGNS: Temp:  [98.8 F (37.1 C)-100.1 F (37.8 C)] 99.4 F (37.4 C) (06/11 0823) Pulse Rate:  [84-110] 84 (06/11 0823) Resp:  [19-31] 19 (06/11 0823) BP: (107-165)/(59-88) 107/63 mmHg (06/11 0823) SpO2:  [86 %-99 %] 99 % (06/11 0823) FiO2 (%):  [50 %-70 %] 60 % (06/11 0823) Weight:  [158 lb 11.7 oz (72 kg)] 158 lb 11.7 oz (72 kg) (06/11 0500) HEMODYNAMICS:   VENTILATOR SETTINGS: Vent Mode:  [-] PRVC FiO2 (%):  [50 %-70 %] 60 % Set Rate:  [16 bmp-20 bmp] 20 bmp Vt Set:  [500 mL] 500 mL PEEP:  [8 cmH20] 8 cmH20 Plateau Pressure:  [16 cmH20-19 cmH20] 17 cmH20 INTAKE / OUTPUT:  Intake/Output Summary (Last 24 hours) at 07/10/14 7824 Last data filed at 07/10/14 0615  Gross per 24 hour  Intake 1242.5 ml  Output   2150 ml  Net -907.5 ml    PHYSICAL EXAMINATION: General: Chronically ill appearing male, with decerebrate posturing Neuro: saccadic eye movements,Pupils are 11mm sluggish bilaterally, negative cough, gag, and corneal reflexes. Negative dolls. Posturing , no myoclonic jerks  HEENT: [place]/AT, ETT  Cardiovascular:  slightly irregular rhythm without rubs, gallops, or murmurs.  Lungs: Course crackles in Right base and diminished in the bases bilaterally. Abdomen:  Soft, nondistended, with normoactive BS, liver border palpable Musculoskeletal:  Grossly intact Skin:  No rash, petechiae, or open wounds.   LABS:  CBC  Recent Labs Lab 07/24/2014 1105 07/07/14 0400 07/08/14 0529  WBC 12.9* 7.2 9.7  HGB 11.1* 11.6* 11.0*  HCT 32.6* 33.5* 32.1*  PLT 106* 109* 104*   Coag's  Recent Labs Lab 07/17/2014 1301  APTT 31  INR 1.14   BMET  Recent Labs Lab 07/07/14 0400 07/08/14 0529 07/09/14 0340  NA 128* 132* 138  K 4.3 3.9 3.9  CL 91* 96* 99*  CO2 30 32 31  BUN 15 18 15   CREATININE 0.88 0.78 0.78  GLUCOSE 114* 116* 97   Electrolytes  Recent Labs Lab 07/04/2014 1105  07/07/14 0400 07/08/14 0529 07/09/14 0340  CALCIUM 7.7*  < > 7.8* 7.7* 8.4*  MG 1.7  --  1.3* 1.9  --   PHOS 5.2*  --  1.9* 2.2*  --   < > = values in this interval not displayed. Sepsis Markers  Recent Labs Lab 07/10/2014 1119 07/10/2014 1301  LATICACIDVEN 3.75* 2.8*  PROCALCITON  --  <0.10   ABG  Recent Labs Lab 07/29/2014 1554 07/03/2014 1843 07/07/14 0502  PHART 7.152* 7.301* 7.419  PCO2ART 93.0* 63.1* 44.3  PO2ART 45.0* 61.0* 65.0*   Liver Enzymes No results for input(s): AST, ALT, ALKPHOS,  BILITOT, ALBUMIN in the last 168 hours. Cardiac Enzymes  Recent Labs Lab 07/24/2014 1758 07/25/2014 2355 07/07/14 0400  TROPONINI 0.20* 0.29* 0.25*   Glucose  Recent Labs Lab 07/09/14 0737 07/09/14 1553 07/09/14 1945 07/09/14 2349 07/10/14 0304 07/10/14 0742  GLUCAP 90 116* 131* 145* 137* 153*    Imaging No results found.   ASSESSMENT / PLAN:  PULMONARY OETT  6/7>>>  A:  Acute respiratory failure s/p cardiac arrest w/ inability to protect airway Unclear cause of hypoxia - ?PE  P:   -Wean FiO2/PEEP  to maintain sats >92% -VAP protocol -Hold off  therapeutic lovenox given overall  prognosis   CARDIOVASCULAR CVL: Left Subclavian 6/7>>> A:  PEA arrest Undifferentiated Shock/vasopressor dependent (Cardiogenic vs. Obstructive vs. Septic) H/o HTN P:  -Off Levophed   RENAL A:   Hyponatremia Hyperphosphatemia  hypomag  P:   -D5NS  -replete electrolyte as indicated  GASTROINTESTINAL A:  No acute process P:   -NPO -hold off TFs  HEMATOLOGIC A:   Slight anemia Thrombocytopenia P:   -monitor int   INFECTIOUS A: No acute problem P:   -Trend fever curve  ENDOCRINE A:  Hyperglycemia P:   -Trend blood glucose per above   NEUROLOGIC A:   Acute anoxic Encephalopathy-Not a candidate for hypothermia protocol Myoclonus P:   -IV valproate, versed prn myoclonus -Maintain normothermia  RASS goal: 0 -fent prn   FAMILY  - Updates: Patient is a ward of the state. Spoke to  state guardian -  She will try to contact family, no family has come yet  - Inter-disciplinary family meet or Palliative Care meeting due by:  6/14    TODAY'S SUMMARY:  59 yo ward of the state with likely devastating injuries due to anoxia. He was found down after an undetermined amount of time, in PEA arrest. Approx 55min ACLS was performed prior to ROSC. Myoclonus indicates extensive anoxic damage - will plan for withdrawal of life support once state guardian agrees & give family a chance to visit -filled out paperwork for withdrawal process 6-10.  Richardson Landry Minor ACNP [facility] Pager [phone] till 3 pm If no answer page [phone] 07/10/2014, 8:26 AM  Full DNR, appears comfortable, awaiting paperwork from the state prior to terminal extubation.  Exam remains very poor.  To hope for any improvement here.  Neurologic exam is essentially flat, not brain dead but severe anoxic encephalopathy.  The patient is critically ill with multiple organ systems failure and requires high complexity decision making for assessment and support, frequent evaluation and titration of therapies,  application of advanced monitoring technologies and extensive interpretation of multiple databases.   Critical Care Time devoted to patient care services described in this note is  35  Minutes. This time reflects time of care of this signee Dr Jennet Maduro. This critical care time does not reflect procedure time, or teaching time or supervisory time of PA/NP/Med student/Med Resident etc but could involve care discussion time.  Rush Farmer, M.D. [facility] Pulmonary/Critical Care Medicine. Pager: [phone]. After hours pager: [phone].

## 2014-07-10 NOTE — Progress Notes (Signed)
Patient desat to low 80s on ventilator.  Paradoxical breathing and absent breath sounds on left side noted on reassessment.  MD notified, no orders received, will continue to monitor.

## 2014-07-11 DIAGNOSIS — G253 Myoclonus: Secondary | ICD-10-CM

## 2014-07-11 LAB — GLUCOSE, CAPILLARY
GLUCOSE-CAPILLARY: 111 mg/dL — AB (ref 65–99)
GLUCOSE-CAPILLARY: 148 mg/dL — AB (ref 65–99)
Glucose-Capillary: 163 mg/dL — ABNORMAL HIGH (ref 65–99)
Glucose-Capillary: 177 mg/dL — ABNORMAL HIGH (ref 65–99)
Glucose-Capillary: 153 mg/dL — ABNORMAL HIGH (ref 65–99)
Glucose-Capillary: 171 mg/dL — ABNORMAL HIGH (ref 65–99)

## 2014-07-11 MED ORDER — FENTANYL CITRATE (PF) 100 MCG/2ML IJ SOLN
50.0000 ug | INTRAMUSCULAR | Status: DC | PRN
  Administered 2014-07-11 – 2014-07-12 (×3): 50 ug via INTRAVENOUS
  Filled 2014-07-11 (×3): qty 2

## 2014-07-11 MED ORDER — ACETAMINOPHEN 160 MG/5ML PO SOLN
650.0000 mg | Freq: Four times a day (QID) | ORAL | Status: DC | PRN
  Administered 2014-07-11 – 2014-07-13 (×5): 650 mg
  Filled 2014-07-11 (×5): qty 20.3

## 2014-07-11 MED ORDER — PNEUMOCOCCAL VAC POLYVALENT 25 MCG/0.5ML IJ INJ
0.5000 mL | INJECTION | INTRAMUSCULAR | Status: DC
  Filled 2014-07-11: qty 0.5

## 2014-07-11 MED ORDER — SODIUM CHLORIDE 0.9 % IV BOLUS (SEPSIS)
1000.0000 mL | Freq: Once | INTRAVENOUS | Status: AC
  Administered 2014-07-11: 1000 mL via INTRAVENOUS

## 2014-07-11 NOTE — Progress Notes (Signed)
PULMONARY / CRITICAL CARE MEDICINE   Name: Andrew Osborn MRN: [id number] DOB: Aug 03, 1955    ADMISSION DATE:  07/23/2014 CONSULTATION DATE: 07/17/2014  REFERRING MD :  Jinny Blossom Dochert  CHIEF COMPLAINT:  Cardiac Arrest  INITIAL PRESENTATION: Andrew Osborn is a 59 yo ward of the state who presents to the ED on 6/7 after an unknown down time s/p PEA arrest requiring approx. 57min ACLS prior to ROSC. He was unresponsive, exhibiting myoclonic jerking and decerebrate posturing.   STUDIES:  6/7: Head CT- No acute intracranial process 6/7 EEg >> myoclonus, cannot r/o status  SIGNIFICANT EVENTS: 6/7: Admitted post PEA arrest, unknown downtime, 57min ACLS until ROSC, intubated in field 6/8 versed gtt for myoclonus 6/10 withdrawal of life support paperwork filled out.  6/12 febrile with szs + hr 160   SUBJ - off levophed afebrile Unresponsive ,off versed gtt -no myoclonic activity   VITAL SIGNS: Temp:  [98.7 F (37.1 C)-101.1 F (38.4 C)] 101.1 F (38.4 C) (06/12 0600) Pulse Rate:  [82-175] 161 (06/12 0735) Resp:  [20-33] 26 (06/12 0735) BP: (96-131)/(56-79) 122/65 mmHg (06/12 0735) SpO2:  [89 %-98 %] 93 % (06/12 0735) FiO2 (%):  [50 %] 50 % (06/12 0735) Weight:  [155 lb 13.8 oz (70.7 kg)] 155 lb 13.8 oz (70.7 kg) (06/12 0600) HEMODYNAMICS:   VENTILATOR SETTINGS: Vent Mode:  [-] PRVC FiO2 (%):  [50 %] 50 % Set Rate:  [20 bmp] 20 bmp Vt Set:  [500 mL] 500 mL PEEP:  [8 cmH20] 8 cmH20 Plateau Pressure:  [15 cmH20-20 cmH20] 18 cmH20 INTAKE / OUTPUT:  Intake/Output Summary (Last 24 hours) at 07/11/14 0850 Last data filed at 07/11/14 0800  Gross per 24 hour  Intake   1360 ml  Output   2170 ml  Net   -810 ml    PHYSICAL EXAMINATION: General: Chronically ill appearing male, with decerebrate posturing Neuro: saccadic eye movements,Pupils are 36mm sluggish bilaterally, negative cough, gag, and corneal reflexes. Negative dolls. Underlying szs activity. Left gaze preference.   HEENT: [place]/AT, ETT  Cardiovascular: ST 160's  Lungs: Course crackles in Right base and diminished in the bases bilaterally. Abdomen:  Soft, nondistended, with normoactive BS, liver border palpable Musculoskeletal:  Grossly intact Skin:  No rash, petechiae, or open wounds. Moist warm  LABS:  CBC  Recent Labs Lab 07/12/2014 1105 07/07/14 0400 07/08/14 0529  WBC 12.9* 7.2 9.7  HGB 11.1* 11.6* 11.0*  HCT 32.6* 33.5* 32.1*  PLT 106* 109* 104*   Coag's  Recent Labs Lab 07/24/2014 1301  APTT 31  INR 1.14   BMET  Recent Labs Lab 07/07/14 0400 07/08/14 0529 07/09/14 0340  NA 128* 132* 138  K 4.3 3.9 3.9  CL 91* 96* 99*  CO2 30 32 31  BUN 15 18 15   CREATININE 0.88 0.78 0.78  GLUCOSE 114* 116* 97   Electrolytes  Recent Labs Lab 07/13/2014 1105  07/07/14 0400 07/08/14 0529 07/09/14 0340  CALCIUM 7.7*  < > 7.8* 7.7* 8.4*  MG 1.7  --  1.3* 1.9  --   PHOS 5.2*  --  1.9* 2.2*  --   < > = values in this interval not displayed. Sepsis Markers  Recent Labs Lab 07/09/2014 1119 07/19/2014 1301  LATICACIDVEN 3.75* 2.8*  PROCALCITON  --  <0.10   ABG  Recent Labs Lab 07/07/2014 1554 07/16/2014 1843 07/07/14 0502  PHART 7.152* 7.301* 7.419  PCO2ART 93.0* 63.1* 44.3  PO2ART 45.0* 61.0* 65.0*   Liver Enzymes No results for  input(s): AST, ALT, ALKPHOS, BILITOT, ALBUMIN in the last 168 hours. Cardiac Enzymes  Recent Labs Lab 07/28/2014 1758 07/22/2014 2355 07/07/14 0400  TROPONINI 0.20* 0.29* 0.25*   Glucose  Recent Labs Lab 07/10/14 1250 07/10/14 1606 07/10/14 2003 07/10/14 2352 07/11/14 0358 07/11/14 0808  GLUCAP 133* 161* 162* 148* 163* 177*    Imaging No results found.   ASSESSMENT / PLAN:  PULMONARY OETT  6/7>>>  A:  Acute respiratory failure s/p cardiac arrest w/ inability to protect airway Unclear cause of hypoxia - ?PE  P:   -Wean FiO2/PEEP  to maintain sats >92% -VAP protocol -Hold off  therapeutic lovenox given overall  prognosis   CARDIOVASCULAR CVL: Left Subclavian 6/7>>> A:  PEA arrest Undifferentiated Shock/vasopressor dependent (Cardiogenic vs. Obstructive vs. Septic) H/o HTN Tachycardia P:  -fluid bolus -treat fever   RENAL A:   Hyponatremia Hyperphosphatemia  hypomag  P:   -D5NS  -replete electrolyte as indicated  GASTROINTESTINAL A:  No acute process P:   -NPO -hold off TFs  HEMATOLOGIC A:   Slight anemia Thrombocytopenia P:   -monitor int   INFECTIOUS A: 6/12 fever 101.1 P:   -check wbc 6-12 -? abx per MD  ENDOCRINE A:  Hyperglycemia P:   -Trend blood glucose per above   NEUROLOGIC A:   Acute anoxic Encephalopathy-Not a candidate for hypothermia protocol Myoclonus 6/12 recurrent sz activity on keppra but with fever may be lowering sz threshold P:   -IV valproate, versed prn myoclonus -Maintain normothermia  RASS goal: 0 -fent prn -treat fever  FAMILY  - Updates: Patient is a ward of the state. Spoke to  state guardian -  She will try to contact family, no family has come yet  - Inter-disciplinary family meet or Palliative Care meeting due by:  6/14    TODAY'S SUMMARY:  59 yo ward of the state with likely devastating injuries due to anoxia. He was found down after an undetermined amount of time, in PEA arrest. Approx 58min ACLS was performed prior to ROSC. Myoclonus indicates extensive anoxic damage - will plan for withdrawal of life support once state guardian agrees & give family a chance to visit -filled out paperwork for withdrawal process 6-10. 6/12 febrile with recurrent szs activity. On keppra, iv versed given, hr 160's tylenol added.  Richardson Landry Minor ACNP Maryanna Shape PCCM Pager [phone] till 3 pm If no answer page [phone] 07/11/2014, 27:65 AM  59 year old male, ward of the state, cardiac arrest, neurologic exam remains very poor with only a respiratory drive.  Awaiting paperwork from the state for withdrawal.  Contacted numbers on chart, no  answer.  Will have case management follow.  Continues to have myoclonus.  The patient is critically ill with multiple organ systems failure and requires high complexity decision making for assessment and support, frequent evaluation and titration of therapies, application of advanced monitoring technologies and extensive interpretation of multiple databases.   Critical Care Time devoted to patient care services described in this note is  35  Minutes. This time reflects time of care of this signee Dr Jennet Maduro. This critical care time does not reflect procedure time, or teaching time or supervisory time of PA/NP/Med student/Med Resident etc but could involve care discussion time.  Rush Farmer, M.D. [facility] Pulmonary/Critical Care Medicine. Pager: [phone]. After hours pager: [phone].

## 2014-07-11 NOTE — Progress Notes (Signed)
Pt HR 168-170 ST with new BBB, seizure activity witnessed, patients legs twitching and nystagmus. Versed 4mg  given per existing orders. Dr. Jimmy Footman called and made aware. No new orders received, will continue to monitor.

## 2014-07-12 ENCOUNTER — Inpatient Hospital Stay (HOSPITAL_COMMUNITY): Payer: Medicare Other

## 2014-07-12 LAB — POCT I-STAT 3, ART BLOOD GAS (G3+)
Acid-Base Excess: 11 mmol/L — ABNORMAL HIGH (ref 0.0–2.0)
BICARBONATE: 41.6 meq/L — AB (ref 20.0–24.0)
O2 SAT: 90 %
PO2 ART: 80 mmHg (ref 80.0–100.0)
Patient temperature: 101
TCO2: 44 mmol/L (ref 0–100)
pCO2 arterial: 97.6 mmHg (ref 35.0–45.0)
pH, Arterial: 7.245 — ABNORMAL LOW (ref 7.350–7.450)

## 2014-07-12 LAB — CBC
HEMATOCRIT: 42.4 % (ref 39.0–52.0)
HEMOGLOBIN: 12.9 g/dL — AB (ref 13.0–17.0)
MCH: 30.6 pg (ref 26.0–34.0)
MCHC: 30.4 g/dL (ref 30.0–36.0)
MCV: 100.5 fL — AB (ref 78.0–100.0)
Platelets: 34 10*3/uL — ABNORMAL LOW (ref 150–400)
RBC: 4.22 MIL/uL (ref 4.22–5.81)
RDW: 16.3 % — ABNORMAL HIGH (ref 11.5–15.5)
WBC: 12 10*3/uL — ABNORMAL HIGH (ref 4.0–10.5)

## 2014-07-12 LAB — BASIC METABOLIC PANEL
ANION GAP: 7 (ref 5–15)
BUN: 63 mg/dL — ABNORMAL HIGH (ref 6–20)
CALCIUM: 10 mg/dL (ref 8.9–10.3)
CO2: 39 mmol/L — ABNORMAL HIGH (ref 22–32)
Chloride: 108 mmol/L (ref 101–111)
Creatinine, Ser: 1.42 mg/dL — ABNORMAL HIGH (ref 0.61–1.24)
GFR calc non Af Amer: 53 mL/min — ABNORMAL LOW (ref >60)
Glucose, Bld: 250 mg/dL — ABNORMAL HIGH (ref 65–99)
Potassium: 5.1 mmol/L (ref 3.5–5.1)
SODIUM: 154 mmol/L — AB (ref 135–145)

## 2014-07-12 LAB — GLUCOSE, CAPILLARY
GLUCOSE-CAPILLARY: 140 mg/dL — AB (ref 65–99)
GLUCOSE-CAPILLARY: 151 mg/dL — AB (ref 65–99)
GLUCOSE-CAPILLARY: 166 mg/dL — AB (ref 65–99)

## 2014-07-12 LAB — MAGNESIUM: Magnesium: 2.3 mg/dL (ref 1.7–2.4)

## 2014-07-12 MED ORDER — AMIODARONE HCL IN DEXTROSE 360-4.14 MG/200ML-% IV SOLN
60.0000 mg/h | INTRAVENOUS | Status: AC
  Administered 2014-07-12: 60 mg/h via INTRAVENOUS
  Filled 2014-07-12: qty 200

## 2014-07-12 MED ORDER — AMIODARONE LOAD VIA INFUSION
150.0000 mg | Freq: Once | INTRAVENOUS | Status: AC
  Administered 2014-07-12: 150 mg via INTRAVENOUS
  Filled 2014-07-12: qty 83.34

## 2014-07-12 MED ORDER — SODIUM CHLORIDE 0.9 % IV BOLUS (SEPSIS)
1000.0000 mL | Freq: Once | INTRAVENOUS | Status: AC
  Administered 2014-07-12: 1000 mL via INTRAVENOUS

## 2014-07-12 MED ORDER — AMIODARONE HCL IN DEXTROSE 360-4.14 MG/200ML-% IV SOLN
30.0000 mg/h | INTRAVENOUS | Status: DC
  Administered 2014-07-13 – 2014-07-14 (×2): 30 mg/h via INTRAVENOUS
  Filled 2014-07-12 (×8): qty 200

## 2014-07-12 MED ORDER — PNEUMOCOCCAL VAC POLYVALENT 25 MCG/0.5ML IJ INJ: 0.5000 mL | INJECTION | INTRAMUSCULAR | Status: DC | PRN

## 2014-07-12 MED ORDER — AMIODARONE IV BOLUS ONLY 150 MG/100ML
150.0000 mg | Freq: Once | INTRAVENOUS | Status: AC
  Administered 2014-07-12: 150 mg via INTRAVENOUS

## 2014-07-12 NOTE — Progress Notes (Signed)
eLink Physician-Brief Progress Note Patient Name: Andrew Osborn DOB: 1955-12-13 MRN: [id number]   Date of Service  07/12/2014  HPI/Events of Note  Appears to be in AFIB/AFLUTTER with ventricular rate = 174. BP = 101/57.  eICU Interventions  Will order: 1. Amiodarone IV bolus and infusion. 2. Check BMP and Magnesium.      Intervention Category Major Interventions: Arrhythmia - evaluation and management  Sommer,Steven Cornelia Copa 07/12/2014, 8:31 PM

## 2014-07-12 NOTE — Progress Notes (Signed)
eLink Physician-Brief Progress Note Patient Name: Andrew Osborn DOB: 02/23/1955 MRN: [id number]   Date of Service  07/12/2014  HPI/Events of Note  Hypotension. HR remains in the 160's and is irregularly irregular.   eICU Interventions  Will order: 1. 0.9 NaCl 1 liter IV over 1 hour now.  2. Rebolus with Amiodarone 150 mg IV.     Intervention Category Major Interventions: Arrhythmia - evaluation and management;Hypotension - evaluation and management  Lysle Dingwall 07/12/2014, 10:31 PM

## 2014-07-12 NOTE — Progress Notes (Addendum)
PULMONARY / CRITICAL CARE MEDICINE   Name: Andrew Osborn MRN: [id number] DOB: 03/28/1955    ADMISSION DATE:  07/20/2014 CONSULTATION DATE: 07/20/2014  REFERRING MD :  Jinny Blossom Dochert  CHIEF COMPLAINT:  Cardiac Arrest  INITIAL PRESENTATION: Andrew Osborn is a 59 yo ward of the state who presents to the ED on 6/7 after an unknown down time s/p PEA arrest requiring approx. 4min ACLS prior to ROSC. He was unresponsive, exhibiting myoclonic jerking and decerebrate posturing.   STUDIES:  6/7: Head CT- No acute intracranial process 6/7 EEG >> myoclonus, cannot r/o status 6/08 LE venous duplex: no DVT  SIGNIFICANT EVENTS: 6/7: Admitted post PEA arrest, unknown downtime, 73min ACLS until ROSC, intubated in field 6/8 versed gtt for myoclonus 6/10 withdrawal of life support paperwork filled out.  6/12 febrile with szs + hr 160 6/13 AFRV. Amiodarone initiated 6/14 Sustained seizure. Lorazepam ordered. Keppra initiated. Awaiting completion of paperwork from state to proceed with discontinuation of vent support   SUBJ:  Sustained seizure. Remains fully comatose  VITAL SIGNS: Temp:  [98.5 F (36.9 C)-102.1 F (38.9 C)] 101.6 F (38.7 C) (06/13 1300) Pulse Rate:  [79-176] 88 (06/13 1300) Resp:  [25-33] 25 (06/13 1300) BP: (80-117)/(30-74) 80/53 mmHg (06/13 1300) SpO2:  [88 %-95 %] 88 % (06/13 1300) FiO2 (%):  [60 %] 60 % (06/13 1123) Weight:  [73.2 kg (161 lb 6 oz)] 73.2 kg (161 lb 6 oz) (06/13 0500) HEMODYNAMICS:   VENTILATOR SETTINGS: Vent Mode:  [-] PRVC FiO2 (%):  [60 %] 60 % Set Rate:  [20 bmp] 20 bmp Vt Set:  [500 mL] 500 mL PEEP:  [8 cmH20] 8 cmH20 Plateau Pressure:  [17 cmH20-20 cmH20] 20 cmH20 INTAKE / OUTPUT:  Intake/Output Summary (Last 24 hours) at 07/12/14 1423 Last data filed at 07/12/14 1200  Gross per 24 hour  Intake   1525 ml  Output   1950 ml  Net   -425 ml    PHYSICAL EXAMINATION: General: RASS -5 Neuro: Left gaze preference. Seizure  HEENT:  [place]/AT Cardiovascular: IRIR, tachy, no M Lungs: no adventitious sounds anteriorly Abdomen:  Soft, nondistended, + BS Ext: no edema  LABS:  CBC  Recent Labs Lab 07/07/14 0400 07/08/14 0529 07/12/14 0500  WBC 7.2 9.7 12.0*  HGB 11.6* 11.0* 12.9*  HCT 33.5* 32.1* 42.4  PLT 109* 104* 34*   Coag's  Recent Labs Lab 07/22/2014 1301  APTT 31  INR 1.14   BMET  Recent Labs Lab 07/07/14 0400 07/08/14 0529 07/09/14 0340  NA 128* 132* 138  K 4.3 3.9 3.9  CL 91* 96* 99*  CO2 30 32 31  BUN 15 18 15   CREATININE 0.88 0.78 0.78  GLUCOSE 114* 116* 97   Electrolytes  Recent Labs Lab 07/26/2014 1105  07/07/14 0400 07/08/14 0529 07/09/14 0340  CALCIUM 7.7*  < > 7.8* 7.7* 8.4*  MG 1.7  --  1.3* 1.9  --   PHOS 5.2*  --  1.9* 2.2*  --   < > = values in this interval not displayed. Sepsis Markers  Recent Labs Lab 07/27/2014 1119 07/10/2014 1301  LATICACIDVEN 3.75* 2.8*  PROCALCITON  --  <0.10   ABG  Recent Labs Lab 07/17/2014 1554 07/29/2014 1843 07/07/14 0502  PHART 7.152* 7.301* 7.419  PCO2ART 93.0* 63.1* 44.3  PO2ART 45.0* 61.0* 65.0*   Liver Enzymes No results for input(s): AST, ALT, ALKPHOS, BILITOT, ALBUMIN in the last 168 hours. Cardiac Enzymes  Recent Labs Lab 06/30/2014 1758 07/17/2014 2355  07/07/14 0400  TROPONINI 0.20* 0.29* 0.25*   Glucose  Recent Labs Lab 07/11/14 1214 07/11/14 1634 07/11/14 1958 07/11/14 2345 07/12/14 0350 07/12/14 0828  GLUCAP 153* 111* 171* 140* 151* 166*    CXR: no new CXR  ASSESSMENT / PLAN:  PULMONARY ETT  6/07 >>  A:  Acute respiratory failure after cardiac arrest P:   Cont full vent support - settings reviewed and/or adjusted Cont vent bundle Daily SBT if/when meets criteria   CARDIOVASCULAR CVL: Left Subclavian 6/07 >>  A:  PEA arrest Shock, resolved H/o HTN AFRVR, new onset 6/13 P:  Cont amiodarone PRN metoprolol to maintain HR < 125  RENAL A:   Hyponatremia Hyperphosphatemia  hypomag P:    No longer checking chemistries  GASTROINTESTINAL A:   No acute process P:   Cont PPI Cont TFs  HEMATOLOGIC A:   Anemia Thrombocytopenia P:  No longer checking labs  INFECTIOUS A:  Fever P:   Scheduled antipyretics  No abx  ENDOCRINE A:   Hyperglycemia P:   No longer monitoring   NEUROLOGIC A:   Severe acute anoxic encephalopathy Myoclonus, resolved Seizure P:   Cont VPA Cont Keppra PRN lorazepam   Merton Border, MD ; [facility] service Mobile [phone].  After 5:30 PM or weekends, call [phone]

## 2014-07-13 DIAGNOSIS — I469 Cardiac arrest, cause unspecified: Secondary | ICD-10-CM | POA: Insufficient documentation

## 2014-07-13 DIAGNOSIS — R4020 Unspecified coma: Secondary | ICD-10-CM | POA: Insufficient documentation

## 2014-07-13 DIAGNOSIS — G931 Anoxic brain damage, not elsewhere classified: Secondary | ICD-10-CM | POA: Insufficient documentation

## 2014-07-13 DIAGNOSIS — R569 Unspecified convulsions: Secondary | ICD-10-CM | POA: Insufficient documentation

## 2014-07-13 DIAGNOSIS — R40243 Glasgow coma scale score 3-8: Secondary | ICD-10-CM

## 2014-07-13 MED ORDER — SODIUM CHLORIDE 0.9 % IV SOLN
500.0000 mg | Freq: Once | INTRAVENOUS | Status: AC
  Administered 2014-07-13: 500 mg via INTRAVENOUS
  Filled 2014-07-13: qty 5

## 2014-07-13 MED ORDER — ACETAMINOPHEN 160 MG/5ML PO SOLN
650.0000 mg | Freq: Four times a day (QID) | ORAL | Status: DC
  Administered 2014-07-13 – 2014-07-14 (×4): 650 mg
  Filled 2014-07-13 (×4): qty 20.3

## 2014-07-13 MED ORDER — LORAZEPAM 2 MG/ML IJ SOLN
2.0000 mg | INTRAMUSCULAR | Status: DC | PRN
  Administered 2014-07-13 – 2014-07-14 (×4): 2 mg via INTRAVENOUS
  Filled 2014-07-13 (×3): qty 1

## 2014-07-13 MED ORDER — METOPROLOL TARTRATE 1 MG/ML IV SOLN
2.5000 mg | INTRAVENOUS | Status: DC | PRN
  Administered 2014-07-13: 2.5 mg via INTRAVENOUS
  Filled 2014-07-13: qty 5

## 2014-07-13 MED ORDER — METOPROLOL TARTRATE 1 MG/ML IV SOLN
INTRAVENOUS | Status: AC
  Administered 2014-07-13: 2.5 mg via INTRAVENOUS
  Filled 2014-07-13: qty 5

## 2014-07-13 MED ORDER — METOPROLOL TARTRATE 1 MG/ML IV SOLN: 2.5000 mg | INTRAVENOUS | Status: DC | PRN

## 2014-07-13 MED ORDER — SODIUM CHLORIDE 0.9 % IV SOLN
500.0000 mg | Freq: Two times a day (BID) | INTRAVENOUS | Status: DC
  Administered 2014-07-13 (×2): 500 mg via INTRAVENOUS
  Filled 2014-07-13 (×4): qty 5

## 2014-07-13 MED ORDER — LORAZEPAM 2 MG/ML IJ SOLN
INTRAMUSCULAR | Status: AC
  Administered 2014-07-13: 2 mg via INTRAVENOUS
  Filled 2014-07-13: qty 1

## 2014-07-13 MED ORDER — LORAZEPAM 2 MG/ML IJ SOLN: 2.0000 mg | Freq: Once | INTRAMUSCULAR | Status: AC

## 2014-07-13 MED ORDER — LORAZEPAM 2 MG/ML IJ SOLN: 2.0000 mg | Freq: Once | INTRAMUSCULAR | Status: DC

## 2014-07-13 NOTE — Clinical Social Work Note (Signed)
CSW continues to follow patient and remains available as needed.   Glendon Axe, MSW, LCSWA [phone] 07/13/2014 10:27 AM

## 2014-07-13 NOTE — Care Management Note (Signed)
Case Management Note  Patient Details  Name: Andrew Osborn MRN: [id number] Date of Birth: 01-17-1956  Subjective/Objective:          Per [place] - paperwork has been received and am waiting on determination to withdraw life support.  Patient continues to seize, has tachycardia, and remains on vent.            Action/Plan:   Expected Discharge Date:                  Expected Discharge Plan:  [place]  In-House Referral:  Clinical Social Work  Discharge planning Services  CM Consult  Post Acute Care Choice:    Choice offered to:     DME Arranged:    DME Agency:     HH Arranged:    [place] Agency:     Status of Service:  In process, will continue to follow  Medicare Important Message Given:    Date Medicare IM Given:    Medicare IM give by:    Date Additional Medicare IM Given:    Additional Medicare Important Message give by:     If discussed at [place] of Stay Meetings, dates discussed:    Additional Comments:  Vergie Living, RN 07/13/2014, 11:37 AM

## 2014-07-13 NOTE — Progress Notes (Addendum)
This RN witnessed phone message left by social worker for DSS addressed to our case manager Clarise Cruz. Permission given to withdraw "life support" from patient. Dr. Wyvonna Plum was present on the floor, he was notified and has requested Clarise Cruz contact them back for their decision to be in writing. Social worker will contact her directors and fax paper work when available. Will update ELINK MD as necessary.

## 2014-07-13 NOTE — Care Management Note (Signed)
Case Management Note  Patient Details  Name: RAEKWON WINKOWSKI MRN: [id number] Date of Birth: 09-29-1955  Subjective/Objective:          Per [place] - paperwork has been received and am waiting on determination to withdraw life support.  Patient continues to seize, has tachycardia, and remains on vent.      07-13-14 1503 received voice message from Linna Darner at [facility] that she has received notice for approval of withdrawal of life support.  Also stated after removal and passing contact [facility] to take body. Called and left message to please send paperwork documenting this.  Talked with Georga Kaufmann SW and he stated we do not need paperwork that this notification should be enough as long as documented.  Updated Dr. Titus Mould and he states they will not withdraw until they receive paperwork documenting this.  Updated Cheri on this need for documentation from state to withdraw.      Received paperwork documenting that they had received and approved our paperwork sent but does not say what the above voice mail said.  Continue to work with Linna Darner on getting this documentation  07-13-13  Talked with Linna Darner who states she does have this documentation and will fax to [place].  After multiple attempts on different fax machines here at [facility], we have found out that the fax machines on the state side are not working.  Waiting on someone to fax this documentation.  Dr. Alva Garnet updated with documentation and voice message.    Action/Plan:   Expected Discharge Date:                  Expected Discharge Plan:  [place]  In-House Referral:  Clinical Social Work  Discharge planning Services  CM Consult  Post Acute Care Choice:    Choice offered to:     DME Arranged:    DME Agency:     HH Arranged:    [place] Agency:     Status of Service:  In process, will continue to follow  Medicare Important Message Given:    Date Medicare IM  Given:    Medicare IM give by:    Date Additional Medicare IM Given:    Additional Medicare Important Message give by:     If discussed at [employer] of Stay Meetings, dates discussed:    Additional Comments:  Vergie Living, RN 07/13/2014, 3:18 PM

## 2014-07-13 NOTE — Progress Notes (Signed)
RT arrived to complete evening assessment and found patient maintaining SATS 85-86%, RR 35-40. Diminished BBS. Sat probe changed to assure equipment was reporting correctly. Sats remained 86%. FIO2 increased to 100% with Sats only increasing to 88%. RN administered VERSED. PT removed from Vent and bagged/lavaged with peep valve @ 10 for 5 minutes. Pt returned to vent and deep suctioned with copious, thick yellow secretions removed. Sats increased to 98%. FiO2 decreased to 90% then 80%. Sats slowly dropped to 88-90%. Per MD note for today maintain Sats 92%>. ELINK contacted by RT. DR. Emmit Alexanders order chest xray & ABG.  ABG results: pH: 7.245, Co2: 97.6, PaO2: 80, HcO3: 41.6 MD ordered VT increased to 8cc= 533mls, Peep: 10 and FiO2 to maintain Sats 90%. RT made changes. Sats 93%. RT will monitor.

## 2014-07-14 MED ORDER — MORPHINE BOLUS VIA INFUSION
5.0000 mg | INTRAVENOUS | Status: DC | PRN
  Filled 2014-07-14: qty 20

## 2014-07-14 MED ORDER — MORPHINE SULFATE 25 MG/ML IV SOLN
10.0000 mg/h | INTRAVENOUS | Status: DC
  Administered 2014-07-14: 1 mg/h via INTRAVENOUS
  Filled 2014-07-14: qty 10

## 2014-07-15 ENCOUNTER — Telehealth: Payer: Self-pay

## 2014-07-15 NOTE — Telephone Encounter (Signed)
On 07/15/2014 I received a death certificate from [employer] and [employer]. The death certificate is for cremation. This patient is a patient of Doctor Simonds. The death certificate will be taken to [facility]) tomorrow morning for signature. On 07/19/2014 I received the original copy of the death certificate. This death certificate will be taken to [facility] this afternoon for signature. On Aug 11, 2014 I received the death certificate back completed from Doctor Simonds. I got the certificate ready for pickup and called the funeral home to let them know it was ready for pickup. I am faxing them a copy and they are going to pick up the original.

## 2014-07-30 NOTE — Progress Notes (Addendum)
1200 Patient asystole on the monitor. No heart tones auscultated. No breath sounds auscultated bilaterally. Pupils fixed and dilated. No palpable pulses. CDS notified. [employer] notified as requested. All tubes and lines removed. MD aware. No autopsy requested.   Verified with Elsie Saas, RN

## 2014-07-30 NOTE — Clinical Social Work Note (Signed)
CSW notified that patient has expired. CSW signing off.   Glendon Axe, MSW, LCSWA [phone] 07/18/2014 12:08 PM

## 2014-07-30 NOTE — Progress Notes (Signed)
235 mL of Morphine Gtt wasted in the sink. Elsie Saas, RN witnessed.

## 2014-07-30 NOTE — Discharge Summary (Signed)
DEATH SUMMARY  DATE OF ADMISSION:  07-13-2014  DATE OF DISCHARGE/DEATH:  07-21-2014  ADMISSION DIAGNOSES:   Acute respiratory failure s/p cardiac arrest  PEA arrest Undifferentiated Shock/vasopressor dependent (Cardiogenic vs. Obstructive vs. Septic) H/o HTN Hyponatremia Hyperphosphatemia Mild anemia Thrombocytopenia Hyperglycemia Anoxic brain injury Acute Encephalopathy   DISCHARGE DIAGNOSES:   Acute respiratory failure after cardiac arrest PEA arrest Shock, resolved H/o HTN AFRVR, new onset 6/13 AKI Hyponatremia Hyperphosphatemia hypomagnesemia Anemia Thrombocytopenia Fever Hyperglycemia Severe acute anoxic encephalopathy Myoclonus, resolved Seizure   PRESENTATION:   Pt was admitted to the ICU/PCCM service with the following HPI and the above admission diagnoses:  HISTORY OF PRESENT ILLNESS: Andrew Osborn is a 59 yo ward of the state with a PMH of 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 6/6 he c/o "not feeling well" at the nursing home. He presents 07-13-22 after being found unresponsive in cardiopulmonary arrest at 0930 for an unknown length of time. He received cardiopulmonary resuscitation for reported PEA arrest for approx. 101min prior to ROSC. He was intubated in the field by EMS. PCCM was consulted to manage patient care. On arrival to trauma bay patient is unresponsive, exhibiting intermittent myoclonic jerks and decerebrate posturing. Subclavian line was placed as patient requires norepi for hemodynamic support. Patient is to be transferred to intensive care post head CT for further management.   HOSPITAL COURSE:   SIGNIFICANT EVENTS: 13-Jul-2022: Admitted post PEA arrest, unknown downtime, 35min ACLS until ROSC, intubated in field 6/08 Severe myoclonus. Midazolam gtt  6/10 withdrawal of life support paperwork filled out and submitted to guardian/state  agency 6/12 febrile. Seizures. Severe tachycardia 6/13 AFRVR. Amiodarone initiated 6/14 Sustained seizures. PRN lorazepam ordered. Keppra initiated. Awaiting completion of paperwork from state to proceed with discontinuation of vent support 07-21-22 Authorization to proceed with vent discontinuation obtained. Morphine infusion initiated. Extubated. Expired shortly thereafter  STUDIES:  2022-07-13: Head CT: No acute intracranial process July 13, 2022 EEG: myoclonus, cannot r/o status 6/08 LE venous duplex: no DVT  Cause of death:  Severe post anoxic encephalopathy after prolonged cardiac arrest of uncertain etiology  Contributing factors: Shock AKI Schizophrenia  Autopsy: No  Smoking: Yes    Merton Border, MD;  PCCM service; Mobile [phone]

## 2014-07-30 NOTE — Progress Notes (Signed)
E-Link notified of patient current status, morphine gtt initated at 1mg /mL, tube feeding and amiodarone discontinued. Respiratory therapy at bedside ready to extubated patient for comfort measures.

## 2014-07-30 NOTE — Progress Notes (Signed)
We have received authorization from pt's guardian, Linna Darner, as representative of state in this matter to withdraw vent and other life-sustaining measures. Protocol for withdrawal initiated  Merton Border, MD ; [facility] [phone].  After 5:30 PM or weekends, call [phone]

## 2014-07-30 NOTE — Procedures (Signed)
Extubation Procedure Note  Patient Details:   Name: Andrew Osborn DOB: 1955/11/06 MRN: [id number]   Airway Documentation: Withdrew support per order to roomair.  No complications.    Evaluation  O2 sats: stable throughout Complications: No apparent complications Patient did not tolerate procedure well. Bilateral Breath Sounds: Clear, Diminished Suctioning: Oral, Airway No  Ned Grace 18-Jul-2014, 11:39 AM

## 2014-07-30 DEATH — deceased

## 2014-08-31 ENCOUNTER — Ambulatory Visit (HOSPITAL_COMMUNITY): Payer: Medicare Other

## 2014-08-31 ENCOUNTER — Ambulatory Visit: Payer: Medicare Other

## 2014-09-01 ENCOUNTER — Ambulatory Visit: Payer: Medicare Other | Admitting: Radiation Oncology

## 2015-03-13 IMAGING — XA IR TUBE REMOVAL GASTROSTOMY
1 series · 1 of 1 positions shown · non-contrast
Comparison: none

CLINICAL DATA: History of supraglottic squamous cell cancer.
Gastrostomy tube is no longer needed.

[Series 1: care single · 1 of 1 slices shown]
[im 1/1]
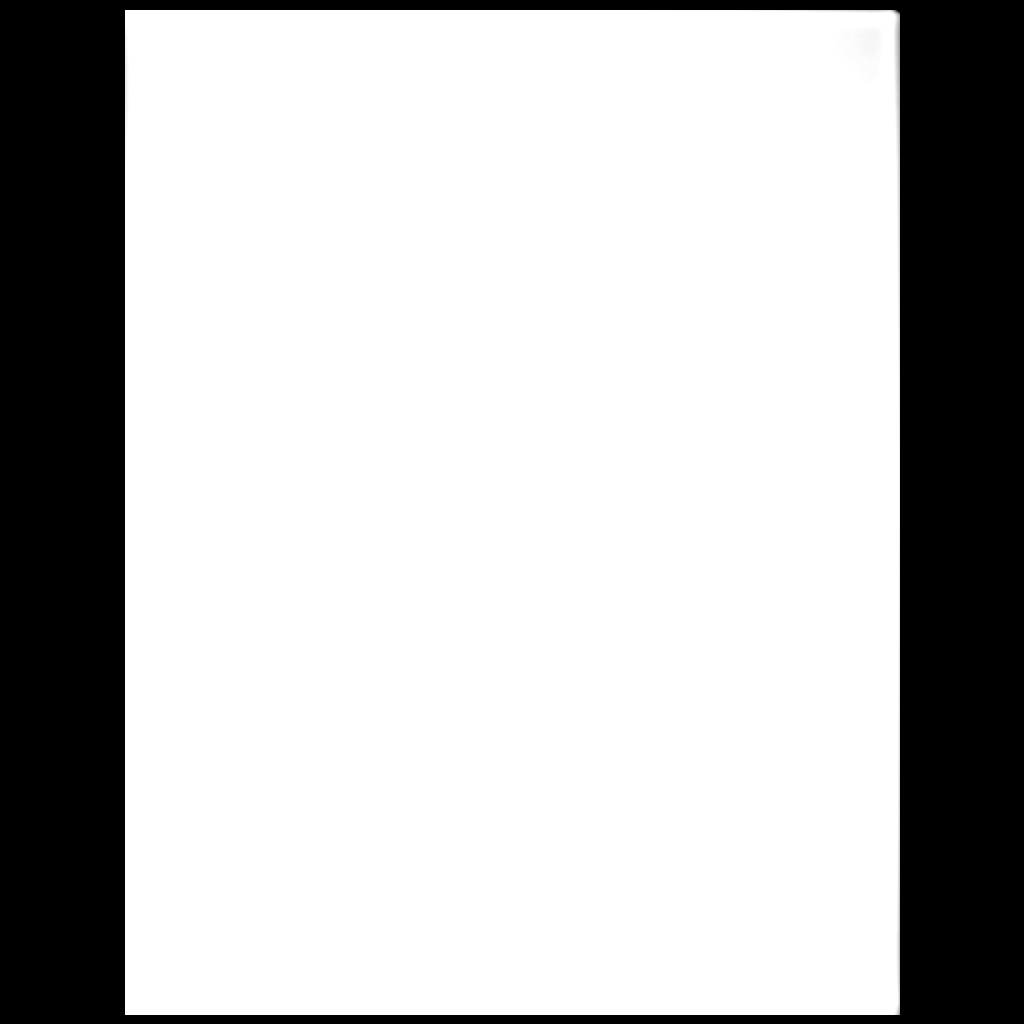

[1 of 1 positions shown; findings below may reference images not displayed]

EXAM:
REMOVAL OF GASTROSTOMY TUBE

FLUOROSCOPY TIME:  None

MEDICATIONS AND MEDICAL HISTORY:
Viscous lidocaine

ANESTHESIA/SEDATION:
Moderate sedation time: None

CONTRAST:  None

PROCEDURE:
The procedure was explained to the patient. The risks and benefits
of the procedure were discussed and the patient's questions were
addressed. Informed consent was obtained from the patient. Viscous
lidocaine was placed along the gastrostomy tube tract. Gastrostomy
tube was completely removed with traction. A bandage was placed over
the tube exit site.
FINDINGS: Complete removal of the gastrostomy tube.

COMPLICATIONS:
None
IMPRESSION: Successful removal of the gastrostomy tube.

## 2015-09-25 IMAGING — CR DG CHEST 1V PORT
1 series · 1 of 1 positions shown · non-contrast
Comparison: PET-CT 03/04/2014, and earlier

CLINICAL DATA: 58-year-old male intubated status post CPR.
Unresponsive. Initial encounter.

EXAM:
PORTABLE CHEST - 1 VIEW

[AP]
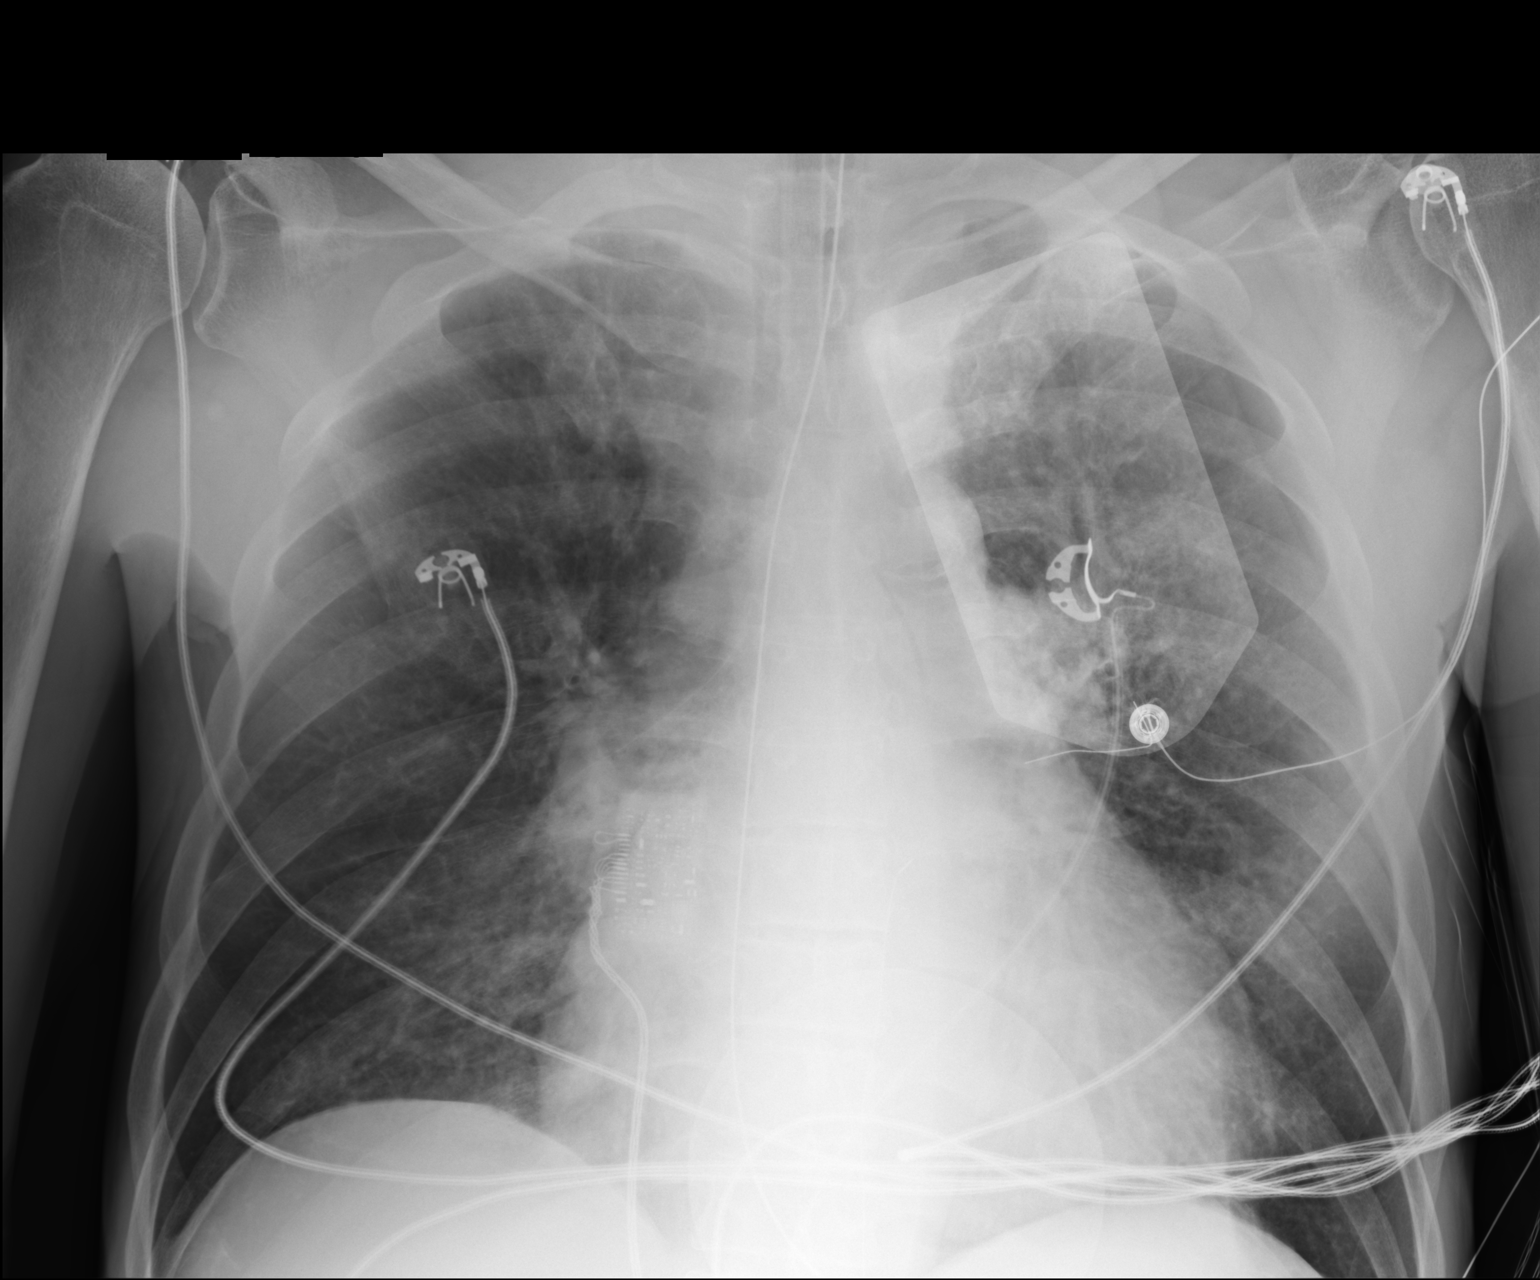

[1 of 1 positions shown; findings below may reference images not displayed]

FINDINGS: Portable AP supine view at 1311 hrs. Resuscitation pads project over
the chest. Endotracheal tube projects over the air column, tip just
below the clavicles. Enteric tube courses to the abdomen, tip not
included. Other EKG leads and wires overlie the chest. Stable
cardiac and mediastinal contours allowing for portable technique. No
pneumothorax, pulmonary edema or pleural effusion. There is mildly
increased streaky perihilar opacity mostly on the right. No other
confluent pulmonary opacity. No displaced rib fracture identified.
IMPRESSION: 1. ET tube in good position.  Enteric tube courses to the abdomen.
2. Mild streaky perihilar opacity greater on the right. Top
differential considerations include aspiration and infection.

## 2015-10-01 IMAGING — CR DG CHEST 1V PORT
2 series · 2 of 2 positions shown · non-contrast
Comparison: 07/06/2014

CLINICAL DATA: Hypoxia.

EXAM:
PORTABLE CHEST - 1 VIEW

[AP (1 of 2)]
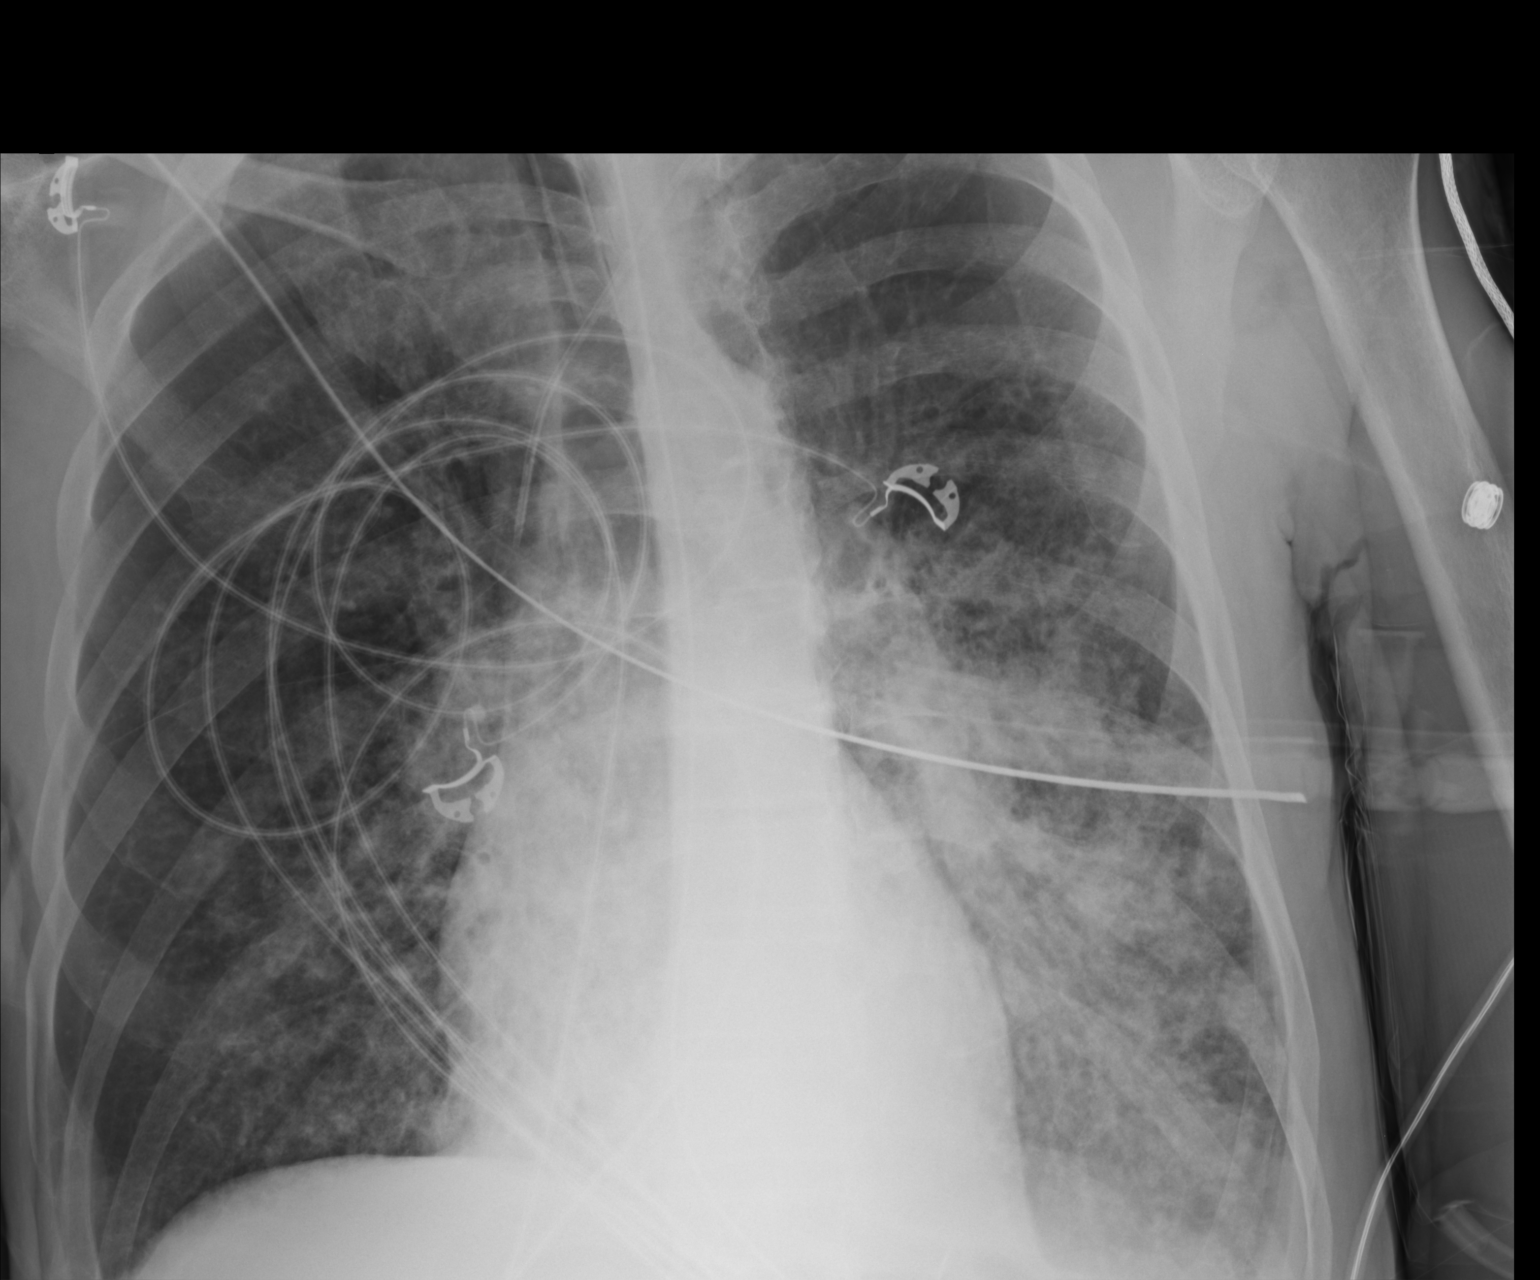

[AP (2 of 2)]
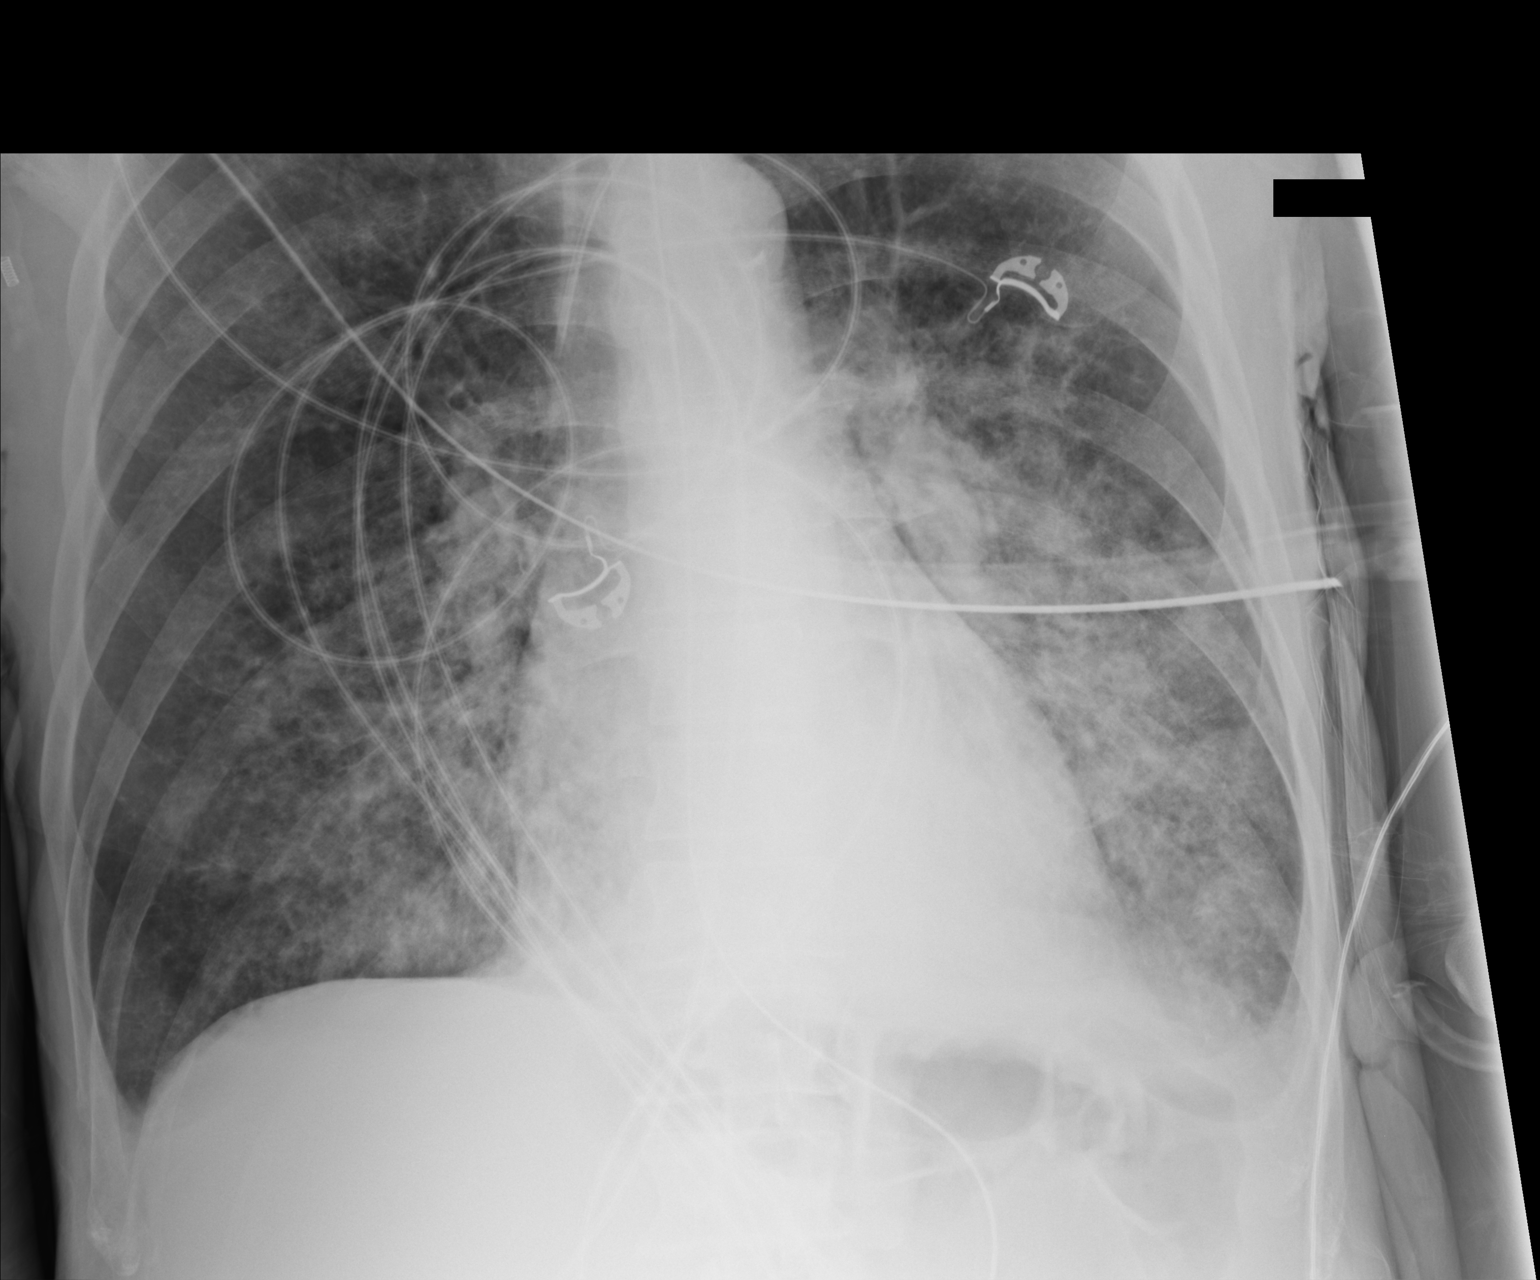

[2 of 2 positions shown; findings below may reference images not displayed]

FINDINGS: Endotracheal tube with tip measuring 6.6 cm above the carina. Left
central venous catheter with tip over the mid SVC region. Normal
heart size and pulmonary vascularity. Since the previous study,
there is interval development of airspace disease in the left lower
lung and right perihilar region suggesting pneumonia. Aspiration
could also be considered. No pneumothorax. Mild blunting of the left
costophrenic angle suggesting small effusion.
IMPRESSION: New airspace disease in the left lung base and right perihilar
region is well a small left pleural effusion. Changes likely to
represent pneumonia.
